# Patient Record
Sex: Female | Born: 1960
Health system: Southern US, Community
[De-identification: ages and names within clinical notes are randomized; demographics above are authoritative.]

## PROBLEM LIST (undated history)

## (undated) DIAGNOSIS — M5030 Other cervical disc degeneration, unspecified cervical region: Secondary | ICD-10-CM

## (undated) DIAGNOSIS — E063 Autoimmune thyroiditis: Secondary | ICD-10-CM

## (undated) DIAGNOSIS — R079 Chest pain, unspecified: Secondary | ICD-10-CM

## (undated) DIAGNOSIS — F419 Anxiety disorder, unspecified: Secondary | ICD-10-CM

## (undated) DIAGNOSIS — N289 Disorder of kidney and ureter, unspecified: Secondary | ICD-10-CM

## (undated) DIAGNOSIS — R0602 Shortness of breath: Secondary | ICD-10-CM

## (undated) DIAGNOSIS — Z87820 Personal history of traumatic brain injury: Secondary | ICD-10-CM

## (undated) DIAGNOSIS — R5382 Chronic fatigue, unspecified: Secondary | ICD-10-CM

## (undated) DIAGNOSIS — Z87898 Personal history of other specified conditions: Secondary | ICD-10-CM

## (undated) DIAGNOSIS — Z8719 Personal history of other diseases of the digestive system: Secondary | ICD-10-CM

## (undated) DIAGNOSIS — G8929 Other chronic pain: Secondary | ICD-10-CM

## (undated) DIAGNOSIS — B259 Cytomegaloviral disease, unspecified: Secondary | ICD-10-CM

## (undated) DIAGNOSIS — Z8619 Personal history of other infectious and parasitic diseases: Secondary | ICD-10-CM

## (undated) DIAGNOSIS — M797 Fibromyalgia: Secondary | ICD-10-CM

## (undated) DIAGNOSIS — R233 Spontaneous ecchymoses: Secondary | ICD-10-CM

## (undated) DIAGNOSIS — Z8711 Personal history of peptic ulcer disease: Secondary | ICD-10-CM

## (undated) DIAGNOSIS — F32A Depression, unspecified: Secondary | ICD-10-CM

## (undated) DIAGNOSIS — M4800 Spinal stenosis, site unspecified: Secondary | ICD-10-CM

## (undated) DIAGNOSIS — Z9889 Other specified postprocedural states: Secondary | ICD-10-CM

## (undated) DIAGNOSIS — N6019 Diffuse cystic mastopathy of unspecified breast: Secondary | ICD-10-CM

## (undated) DIAGNOSIS — D649 Anemia, unspecified: Secondary | ICD-10-CM

## (undated) DIAGNOSIS — M1990 Unspecified osteoarthritis, unspecified site: Secondary | ICD-10-CM

## (undated) DIAGNOSIS — F329 Major depressive disorder, single episode, unspecified: Secondary | ICD-10-CM

## (undated) DIAGNOSIS — E538 Deficiency of other specified B group vitamins: Secondary | ICD-10-CM

## (undated) DIAGNOSIS — D219 Benign neoplasm of connective and other soft tissue, unspecified: Secondary | ICD-10-CM

## (undated) DIAGNOSIS — Z973 Presence of spectacles and contact lenses: Secondary | ICD-10-CM

## (undated) DIAGNOSIS — E039 Hypothyroidism, unspecified: Secondary | ICD-10-CM

## (undated) DIAGNOSIS — R009 Unspecified abnormalities of heart beat: Secondary | ICD-10-CM

## (undated) DIAGNOSIS — R238 Other skin changes: Secondary | ICD-10-CM

## (undated) DIAGNOSIS — K5900 Constipation, unspecified: Secondary | ICD-10-CM

## (undated) DIAGNOSIS — R0789 Other chest pain: Secondary | ICD-10-CM

## (undated) DIAGNOSIS — Z8669 Personal history of other diseases of the nervous system and sense organs: Secondary | ICD-10-CM

## (undated) DIAGNOSIS — G9332 Myalgic encephalomyelitis/chronic fatigue syndrome: Secondary | ICD-10-CM

## (undated) DIAGNOSIS — K219 Gastro-esophageal reflux disease without esophagitis: Secondary | ICD-10-CM

## (undated) DIAGNOSIS — M549 Dorsalgia, unspecified: Secondary | ICD-10-CM

## (undated) DIAGNOSIS — A281 Cat-scratch disease: Secondary | ICD-10-CM

## (undated) DIAGNOSIS — R1310 Dysphagia, unspecified: Secondary | ICD-10-CM

## (undated) DIAGNOSIS — N939 Abnormal uterine and vaginal bleeding, unspecified: Secondary | ICD-10-CM

## (undated) DIAGNOSIS — E162 Hypoglycemia, unspecified: Secondary | ICD-10-CM

## (undated) DIAGNOSIS — Z86018 Personal history of other benign neoplasm: Secondary | ICD-10-CM

## (undated) DIAGNOSIS — A6920 Lyme disease, unspecified: Secondary | ICD-10-CM

## (undated) DIAGNOSIS — Z8639 Personal history of other endocrine, nutritional and metabolic disease: Secondary | ICD-10-CM

## (undated) DIAGNOSIS — R002 Palpitations: Secondary | ICD-10-CM

## (undated) DIAGNOSIS — M2550 Pain in unspecified joint: Secondary | ICD-10-CM

## (undated) HISTORY — DX: Benign neoplasm of connective and other soft tissue, unspecified: D21.9

## (undated) HISTORY — DX: Other cervical disc degeneration, unspecified cervical region: M50.30

## (undated) HISTORY — DX: Unspecified osteoarthritis, unspecified site: M19.90

## (undated) HISTORY — DX: Dysphagia, unspecified: R13.10

## (undated) HISTORY — DX: Personal history of other infectious and parasitic diseases: Z86.19

## (undated) HISTORY — DX: Shortness of breath: R06.02

## (undated) HISTORY — DX: Pain in unspecified joint: M25.50

## (undated) HISTORY — DX: Personal history of peptic ulcer disease: Z87.11

## (undated) HISTORY — DX: Diffuse cystic mastopathy of unspecified breast: N60.19

## (undated) HISTORY — DX: Myalgic encephalomyelitis/chronic fatigue syndrome: G93.32

## (undated) HISTORY — DX: Spinal stenosis, site unspecified: M48.00

## (undated) HISTORY — DX: Disorder of kidney and ureter, unspecified: N28.9

## (undated) HISTORY — DX: Deficiency of other specified B group vitamins: E53.8

## (undated) HISTORY — DX: Chronic fatigue, unspecified: R53.82

## (undated) HISTORY — DX: Chest pain, unspecified: R07.9

## (undated) HISTORY — DX: Depression, unspecified: F32.A

## (undated) HISTORY — DX: Lyme disease, unspecified: A69.20

## (undated) HISTORY — DX: Autoimmune thyroiditis: E06.3

## (undated) HISTORY — DX: Other specified postprocedural states: Z98.890

## (undated) HISTORY — DX: Palpitations: R00.2

## (undated) HISTORY — DX: Cytomegaloviral disease, unspecified: B25.9

## (undated) HISTORY — DX: Major depressive disorder, single episode, unspecified: F32.9

## (undated) HISTORY — DX: Hypothyroidism, unspecified: E03.9

## (undated) HISTORY — DX: Unspecified abnormalities of heart beat: R00.9

## (undated) HISTORY — PX: TILT TABLE STUDY: SHX6124

## (undated) HISTORY — DX: Fibromyalgia: M79.7

## (undated) HISTORY — PX: OTHER SURGICAL HISTORY: SHX169

## (undated) HISTORY — DX: Anemia, unspecified: D64.9

## (undated) HISTORY — PX: TONSILLECTOMY: SUR1361

## (undated) HISTORY — DX: Constipation, unspecified: K59.00

## (undated) HISTORY — DX: Cat-scratch disease: A28.1

---

## 2004-02-26 HISTORY — PX: OTHER SURGICAL HISTORY: SHX169

## 2005-02-25 HISTORY — PX: GANGLION CYST EXCISION: SHX1691

## 2005-04-23 ENCOUNTER — Other Ambulatory Visit: Admission: RE | Admit: 2005-04-23 | Discharge: 2005-04-23 | Payer: Self-pay | Admitting: Obstetrics and Gynecology

## 2005-08-15 ENCOUNTER — Encounter: Admission: RE | Admit: 2005-08-15 | Discharge: 2005-08-15 | Payer: Self-pay | Admitting: Obstetrics and Gynecology

## 2005-09-03 ENCOUNTER — Encounter: Admission: RE | Admit: 2005-09-03 | Discharge: 2005-09-03 | Payer: Self-pay | Admitting: Obstetrics and Gynecology

## 2005-09-11 ENCOUNTER — Encounter: Admission: RE | Admit: 2005-09-11 | Discharge: 2005-09-11 | Payer: Self-pay | Admitting: Obstetrics and Gynecology

## 2006-06-16 ENCOUNTER — Encounter: Admission: RE | Admit: 2006-06-16 | Discharge: 2006-06-16 | Payer: Self-pay | Admitting: Orthopedic Surgery

## 2006-06-18 ENCOUNTER — Ambulatory Visit: Payer: Self-pay | Admitting: Gastroenterology

## 2006-06-24 ENCOUNTER — Encounter (INDEPENDENT_AMBULATORY_CARE_PROVIDER_SITE_OTHER): Payer: Self-pay | Admitting: Gastroenterology

## 2006-06-24 ENCOUNTER — Ambulatory Visit: Payer: Self-pay | Admitting: Gastroenterology

## 2006-06-30 ENCOUNTER — Ambulatory Visit: Payer: Self-pay | Admitting: Gastroenterology

## 2006-07-25 ENCOUNTER — Ambulatory Visit: Payer: Self-pay | Admitting: Gastroenterology

## 2006-08-23 ENCOUNTER — Encounter: Admission: RE | Admit: 2006-08-23 | Discharge: 2006-08-23 | Payer: Self-pay | Admitting: Gastroenterology

## 2006-09-10 ENCOUNTER — Encounter: Admission: RE | Admit: 2006-09-10 | Discharge: 2006-09-10 | Payer: Self-pay | Admitting: Obstetrics and Gynecology

## 2006-09-22 ENCOUNTER — Encounter: Admission: RE | Admit: 2006-09-22 | Discharge: 2006-09-22 | Payer: Self-pay | Admitting: Gastroenterology

## 2007-09-11 ENCOUNTER — Encounter: Admission: RE | Admit: 2007-09-11 | Discharge: 2007-09-11 | Payer: Self-pay | Admitting: Obstetrics and Gynecology

## 2008-02-26 HISTORY — PX: HERNIA REPAIR: SHX51

## 2008-12-13 ENCOUNTER — Encounter: Admission: RE | Admit: 2008-12-13 | Discharge: 2008-12-13 | Payer: Self-pay | Admitting: Obstetrics and Gynecology

## 2009-12-15 ENCOUNTER — Ambulatory Visit (HOSPITAL_COMMUNITY): Admission: RE | Admit: 2009-12-15 | Discharge: 2009-12-15 | Payer: Self-pay | Admitting: Obstetrics and Gynecology

## 2010-03-18 ENCOUNTER — Encounter: Payer: Self-pay | Admitting: Obstetrics and Gynecology

## 2010-07-10 NOTE — Assessment & Plan Note (Signed)
[facility]                         GASTROENTEROLOGY OFFICE NOTE   NAME:[name], [name]                   MRN:          [id number]  DATE:07/25/2006                            DOB:          [birth year]    [name] comes in on May 30.  Says that she is still having some left lower  quadrant pain.  She is still having some left lower quadrant pain and is  having an ultrasound with her OB/GYN, Dr. Darcus Austin because of this.  Her  endoscopy revealed a small hiatal hernia with some minimal gastritis.  A  small bowel biopsy was normal.  Ultrasound was totally normal.  I read  all of this to her.   PHYSICAL EXAMINATION:  VITAL SIGNS:  On her examination, she weighed  107.  Blood pressure 128/66, pulse 72 and regular.  NECK:  Unremarkable.  HEART:  Unremarkable.  EXTREMITIES:  Unremarkable.   IMPRESSION:  1. Most likely irritable bowel syndrome.  2. Gastroesophageal reflux disease.  3. Anxiety/depression.  4. Chronic fatigue syndrome.  5. Fibromyalgia.  6. Status post Lyme's disease.  7. History of babesiosis.   Her medications include Mepron, sulfamethoxazole, Armour Thyroid,  Sertraline 100 mg daily, Flexeril, Vicodin, probiotics, Cortef, and  Diflucan.   My recommendation is that she try Zegerid, which she might not do  because she does not like to take medicines, very anxious about this.  I  told her she probably has a semblance of irritable bowel syndrome.  We  reviewed all of her other studies, and they were negative.  I told her  that she should follow up with one of my associates in the future.     [name], MD  Electronically Signed    SML/MedQ  DD: 07/25/2006  DT: 07/25/2006  Job #: [id number]

## 2010-07-13 NOTE — Assessment & Plan Note (Signed)
[facility]                         GASTROENTEROLOGY OFFICE NOTE   NAME:Gonzalez, Mask                   MRN:          [id number]  DATE:06/18/2006                            DOB:          11/02/1960    PROBLEM:  Abdominal pain, bloating.   HISTORY:  [name] is a 50 year old white female with multiple medical  problems.  She says that she carries a diagnosis of chronic fatigue  syndrome, fibromyalgia, Lyme disease.  She has history of anxiety and  depression, and has a peripheral neuropathy.  She is currently followed  by a Dr. Prince Rome.   The patient has been on multiple different medication regimens and  antibiotics for apparently Lyme disease and babesiosis.  Is currently  taking a course of Diflucan for a systemic candidiasis as well.  She  relates that she has been having epigastric pain radiating into her back  associated with abdominal bloating over the past year.  This has  progressed somewhat to the point where she feels bloated all of the  time.  She has also gained some weight over the past few years.  She had  been on a long course of Cortef, which she is still taking.  She has  active symptoms with her fibromyalgia, and says that she has pain in all  of her muscles most of the time, and feels that her abdominal discomfort  is worse with any empty stomach, seems to be better after p.o. intake,  or post taking her antibiotics.  She denies any difficulty with  heartburn or indigestion.  She is not currently having any difficulty  with dysphagia or odynophagia.  She has no nausea or vomiting.  Her  bowel habits seem to alternate, and apparently are not consistent,  though she is not currently having any significant difficulty with  either diarrhea or constipation.  She says she has been on alternating  courses of antibiotics for the past several years due to her diagnoses  with the Lyme disease, babesiosis, etc.  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   The patient had previous colonoscopy done in 2001 in [place], which was  apparently a negative exam.  We do not have a copy of that report.   CURRENT MEDICATIONS:  1. Thyroid supplement.  2. Flexeril p.r.n.  3. Vicodin 7.5/750 p.r.n.  4. She is on a probiotic, unspecified.  5. Cortef.  6. Diflucan.  7. Sulfamethoxazole.  8. Mepron.   She did not bring these medicines with her today, and does not know the  dosages currently.   CURRENT ALLERGIES:  1. IODINE.  2. CONTRAST AND IV DYE.   PAST MEDICAL HISTORY:  1. Hypothyroid.  2. Anxiety.  3. Depression.  4. History of uterine fibroids.  5. Chronic fatigue syndrome.  6. Fibromyalgia.  7. Lyme disease.   FAMILY HISTORY:  Negative for colon cancer and polyps.  Positive for  heart disease in her father.   SOCIAL HISTORY:  Patient is married.  She had previously been employed  as  a Publishing copy.  She does have 2 masters degrees.  She is a  nonsmoker.  Nondrinker.   REVIEW OF SYSTEMS:  Positive for ongoing problems with myalgias and  fatigue, insomnia, back pain, dysmenorrhea, intermittent fevers,  arthralgias, periodic difficulty with psoriasis, allergy sinus symptoms.  Otherwise, as outlined in HPI.  Remainder of review of systems is  negative.   PHYSICAL EXAMINATION:  Well-developed white female, in no acute  distress.  Height is 5 feet 4.  Weight is 153.  Blood pressure 122/84.  Pulse is  88.  HEENT:  Atraumatic and normocephalic.  EOMI.  PERRLA.  Sclerae  anicteric.  NECK:  Supple without nodes.  CARDIOVASCULAR:  Regular rate and rhythm with S1 and S2.  No murmur, rub  or gallop.  PULMONARY:  Clear to A&P.  ABDOMEN:  Soft.  Bowel sounds are active.  She is mildly tender in the  epigastrium and left upper quadrant.  There is no palpable mass or  hepatosplenomegaly.  No guarding or rebound.  RECTAL EXAM:  Is  Hemoccult negative.  She does have a small external  hemorrhoidal tag.   IMPRESSION:  1. A 50 year old white female with chronic epigastric pain and      bloating.  Rule out irritable bowel syndrome, bacterial overgrowth,      gallbladder disease, possible peptic ulcer disease.  2. Fibromyalgia.  Chronic fatigue syndrome.  3. Reported history of Lyme disease, systemic candidiasis, and      babesiosis for which she has been on a variety of antibiotics.  4. Anxiety and depression.   PLAN:  1. Scheduled upper endoscopy.  2. Schedule upper abdominal ultrasound.  3. Check CBC with differential and lipase.  4. Trial of Align 1 p.o. daily.  5. Trial of Protonix 40 mg p.o. daily.  6. Further workup pending findings on EGD and ultrasound.      Mike Gip, PA-C  Electronically Signed      [name], MD  Electronically Signed   AE/MedQ  DD: 06/24/2006  DT: 06/24/2006  Job #: [id number]

## 2010-07-25 ENCOUNTER — Other Ambulatory Visit: Payer: Self-pay | Admitting: *Deleted

## 2010-07-25 DIAGNOSIS — M549 Dorsalgia, unspecified: Secondary | ICD-10-CM

## 2010-08-06 ENCOUNTER — Ambulatory Visit
Admission: RE | Admit: 2010-08-06 | Discharge: 2010-08-06 | Disposition: A | Discharge status: Home / Self Care | Payer: Medicare Other | Source: Ambulatory Visit | Attending: *Deleted | Admitting: *Deleted

## 2010-08-06 ENCOUNTER — Ambulatory Visit
Admission: RE | Admit: 2010-08-06 | Discharge: 2010-08-06 | Disposition: A | Discharge status: Home / Self Care | Payer: 59 | Source: Ambulatory Visit | Attending: *Deleted | Admitting: *Deleted

## 2010-08-06 DIAGNOSIS — M549 Dorsalgia, unspecified: Secondary | ICD-10-CM

## 2010-08-14 ENCOUNTER — Other Ambulatory Visit: Payer: Self-pay | Admitting: *Deleted

## 2010-08-14 DIAGNOSIS — M545 Low back pain, unspecified: Secondary | ICD-10-CM

## 2010-08-14 DIAGNOSIS — M542 Cervicalgia: Secondary | ICD-10-CM

## 2010-08-15 ENCOUNTER — Other Ambulatory Visit: Payer: Medicare Other

## 2010-08-15 ENCOUNTER — Other Ambulatory Visit: Payer: 59

## 2010-08-15 ENCOUNTER — Ambulatory Visit
Admission: RE | Admit: 2010-08-15 | Discharge: 2010-08-15 | Disposition: A | Discharge status: Home / Self Care | Payer: Medicare Other | Source: Ambulatory Visit | Attending: *Deleted | Admitting: *Deleted

## 2010-08-15 DIAGNOSIS — M545 Low back pain, unspecified: Secondary | ICD-10-CM

## 2010-08-15 DIAGNOSIS — M542 Cervicalgia: Secondary | ICD-10-CM

## 2010-10-30 ENCOUNTER — Other Ambulatory Visit (HOSPITAL_COMMUNITY): Payer: Self-pay | Admitting: Internal Medicine

## 2010-10-30 DIAGNOSIS — E210 Primary hyperparathyroidism: Secondary | ICD-10-CM

## 2010-11-13 ENCOUNTER — Encounter (HOSPITAL_COMMUNITY)
Admission: RE | Admit: 2010-11-13 | Discharge: 2010-11-13 | Disposition: A | Discharge status: Home / Self Care | Payer: 59 | Source: Ambulatory Visit | Attending: Internal Medicine | Admitting: Internal Medicine

## 2010-11-13 ENCOUNTER — Ambulatory Visit (HOSPITAL_COMMUNITY): Payer: 59

## 2010-11-13 DIAGNOSIS — E210 Primary hyperparathyroidism: Secondary | ICD-10-CM

## 2010-11-13 MED ORDER — TECHNETIUM TC 99M SESTAMIBI - CARDIOLITE
25.0000 | Freq: Once | INTRAVENOUS | Status: AC | PRN
  Administered 2010-11-13: 25 via INTRAVENOUS

## 2010-12-24 ENCOUNTER — Other Ambulatory Visit: Payer: Self-pay | Admitting: Obstetrics and Gynecology

## 2011-01-07 ENCOUNTER — Encounter (INDEPENDENT_AMBULATORY_CARE_PROVIDER_SITE_OTHER): Payer: Self-pay | Admitting: Surgery

## 2011-01-08 ENCOUNTER — Encounter (INDEPENDENT_AMBULATORY_CARE_PROVIDER_SITE_OTHER): Payer: Self-pay | Admitting: Surgery

## 2011-01-08 ENCOUNTER — Ambulatory Visit (INDEPENDENT_AMBULATORY_CARE_PROVIDER_SITE_OTHER): Payer: 59 | Admitting: Surgery

## 2011-01-08 DIAGNOSIS — E210 Primary hyperparathyroidism: Secondary | ICD-10-CM

## 2011-01-08 NOTE — Progress Notes (Signed)
Chief Complaint  Patient presents with  . Parathyroid Adenoma    referral from Dr. Talmage Coin    HISTORY: Patient is a 50 year old white female referred by her endocrinologist for consideration for minimally invasive parathyroidectomy. Patient has been followed closely for multiple medical concerns. Laboratory studies over the past one to 2 years have shown a persistently elevated serum calcium level. Recent calcium levels were measured at 10.6 with an ionized calcium of 5.8. Intact PTH level was checked and was in the mid normal range at 34. However given the serum calcium level this did not appear to be normally suppressed. Therefore a 24-hour urine collection for calcium was obtained and was elevated at 268. A nuclear medicine parathyroid scan was performed which localized abnormal uptake in the left inferior position. Patient's vitamin D level was normal at 42. Patient is now referred for consideration for minimally invasive left inferior parathyroidectomy.  Patient does have a family history of hypothyroidism in herself, her sisters, and her mother. There is no family history of other endocrinopathy. Patient has had a tonsillectomy but no other head or neck surgery has been performed.   Past Medical History  Diagnosis Date  . Hyperparathyroidism   . Hashimoto thyroiditis   . Bell's palsy   . Lyme disease   . Depression   . Asthma   . Migraines   . Hiatal hernia   . Fibroids   . Neuropathy     hands and feet  . HSV-2 (herpes simplex virus 2) infection   . Esophageal reflux   . Scoliosis   . Osteoarthritis   . Vitamin D deficiency   . Fibromyalgia   . Fibrocystic breast disease   . Internal hemorrhoids   . Allergic rhinitis   . Cat-scratch disease   . History of babesiosis   . Arrhythmia      Current Outpatient Prescriptions  Medication Sig Dispense Refill  . acyclovir (ZOVIRAX) 200 MG capsule Take by mouth every 4 (four) hours while awake.        . beclomethasone  (QVAR) 80 MCG/ACT inhaler Inhale 1 puff into the lungs as needed.        . cholecalciferol (VITAMIN D) 400 UNITS TABS Take by mouth.        . clarithromycin (BIAXIN) 500 MG tablet BID times 48H.      . clindamycin (CLEOCIN) 300 MG capsule BID times 48H.      . clonazePAM (KLONOPIN) 0.5 MG tablet Take 0.5 mg by mouth 2 (two) times daily as needed.        . cyclobenzaprine (FLEXERIL) 10 MG tablet Take 10 mg by mouth 3 (three) times daily as needed.        . DULoxetine (CYMBALTA) 60 MG capsule Take 60 mg by mouth daily.        . fluconazole (DIFLUCAN) 150 MG tablet Take 150 mg by mouth once. Please check dose       . gabapentin (NEURONTIN) 300 MG capsule Take 300 mg by mouth as needed.       Marland Kitchen HYDROcodone-acetaminophen (VICODIN ES) 7.5-750 MG per tablet Take 1 tablet by mouth every 6 (six) hours as needed.        . hydrocortisone (CORTEF) 5 MG tablet daily.      . hydroxychloroquine (PLAQUENIL) 200 MG tablet Take by mouth daily.        Marland Kitchen levalbuterol (XOPENEX) 0.63 MG/3ML nebulizer solution Take 1 ampule by nebulization every 4 (four) hours as needed.        Marland Kitchen  levocetirizine (XYZAL) 5 MG tablet       . montelukast (SINGULAIR) 10 MG tablet       . Naltrexone HCl POWD by Does not apply route.        . nystatin (MYCOSTATIN) 500000 UNITS TABS Take 1 tablet by mouth every 8 (eight) hours.        . Probiotic Product (PRO-BIOTIC BLEND PO) Take by mouth.        . progesterone (PROMETRIUM) 100 MG capsule Take 100 mg by mouth daily. Please check dose      . thyroid (ARMOUR) 120 MG tablet Take 45 mg by mouth daily.        . traMADol (ULTRAM-ER) 200 MG 24 hr tablet       . traZODone (DESYREL) 50 MG tablet Take 50 mg by mouth at bedtime.        . vitamin B-12 (CYANOCOBALAMIN) 1000 MCG tablet 1,000 mcg by Other route daily.        Marland Kitchen VIVELLE-DOT 0.075 MG/24HR          Allergies  Allergen Reactions  . Flagyl (Metronidazole Hcl)   . Iodine   . Iohexol      Code: HIVES, Desc: pt. states had hives post  contrast injection for c.t. scan.   . Synthroid   . Tindamax      Family History  Problem Relation Age of Onset  . Mitral valve prolapse Father   . Hypertension Father   . Dementia Father   . Hypothyroidism Mother   . Colon polyps Brother   . Aneurysm Brother   . Arthritis Brother   . Lupus Sister   . Rheum arthritis Sister   . Osteoarthritis Sister   . Cancer Maternal Aunt     breast     History   Social History  . Marital Status: Married    Spouse Name: N/A    Number of Children: N/A  . Years of Education: N/A   Social History Main Topics  . Smoking status: Never Smoker   . Smokeless tobacco: None  . Alcohol Use: No  . Drug Use: No  . Sexually Active:    Other Topics Concern  . None   Social History Narrative  . None     REVIEW OF SYSTEMS - PERTINENT POSITIVES ONLY: No history of nephrolithiasis. No history of osteoporosis. Moderate fatigue.   EXAM: Filed Vitals:   01/08/11 1358  BP: 122/88  Pulse: 80  Temp: 97 F (36.1 C)  Resp: 12    HEENT: normocephalic; pupils equal and reactive; sclerae clear; dentition good; mucous membranes moist NECK:  No palpable masses or nodules; symmetric on extension; no palpable anterior or posterior cervical lymphadenopathy; no supraclavicular masses; no tenderness CHEST: clear to auscultation bilaterally without rales, rhonchi, or wheezes CARDIAC: regular rate and rhythm without significant murmur; peripheral pulses are full EXT:  non-tender without edema; no deformity NEURO: no gross focal deficits; no sign of tremor   LABORATORY RESULTS: See E-Chart for most recent results   RADIOLOGY RESULTS: See E-Chart or I-Site for most recent results   IMPRESSION: Hypercalcemia, likely left inferior parathyroid adenoma   PLAN: I had a lengthy discussion with the patient and her husband. They recorded our conversation in its entirety. I discussed primary hyperparathyroidism. We reviewed all of her laboratory  results and diagnostic studies. I agree with the recommendation of her gynecologist that she undergo a minimally invasive parathyroidectomy. We discussed the possibility of success. We discussed the possibility of  a second gland adenoma. We discussed the possibility of repeat surgery or her performing a more extensive procedure at the time of exploration. We discussed potential complications including injury to recurrent laryngeal nerves or other structures in the neck. We discussed the surgical wound to be anticipated. We discussed the hospital stay and her postoperative recovery. They understand and wish to proceed.  The risks and benefits of the procedure have been discussed at length with the patient.  The patient understands the proposed procedure, potential alternative treatments, and the course of recovery to be expected.  All of the patient's questions have been answered at this time.  The patient wishes to proceed with surgery and will schedule a date for their procedure through our office staff.   Jordan Heckler, MD, FACS General & Endocrine Surgery [facility]    Visit Diagnoses: 1. Hyperparathyroidism, primary     Primary Care Physician: Lolita Patella, MD  Endo:  Dr. Talmage Coin

## 2011-01-22 ENCOUNTER — Encounter (HOSPITAL_COMMUNITY): Payer: Self-pay | Admitting: Pharmacy Technician

## 2011-01-24 ENCOUNTER — Ambulatory Visit (HOSPITAL_COMMUNITY)
Admission: RE | Admit: 2011-01-24 | Discharge: 2011-01-24 | Disposition: A | Discharge status: Home / Self Care | Payer: 59 | Source: Ambulatory Visit | Attending: Surgery | Admitting: Surgery

## 2011-01-24 ENCOUNTER — Encounter (HOSPITAL_COMMUNITY): Payer: Self-pay

## 2011-01-24 ENCOUNTER — Other Ambulatory Visit: Payer: Self-pay

## 2011-01-24 ENCOUNTER — Encounter (HOSPITAL_COMMUNITY)
Admission: RE | Admit: 2011-01-24 | Discharge: 2011-01-24 | Disposition: A | Discharge status: Home / Self Care | Payer: 59 | Source: Ambulatory Visit | Attending: Surgery | Admitting: Surgery

## 2011-01-24 DIAGNOSIS — E063 Autoimmune thyroiditis: Secondary | ICD-10-CM | POA: Insufficient documentation

## 2011-01-24 DIAGNOSIS — Z01810 Encounter for preprocedural cardiovascular examination: Secondary | ICD-10-CM | POA: Insufficient documentation

## 2011-01-24 DIAGNOSIS — IMO0001 Reserved for inherently not codable concepts without codable children: Secondary | ICD-10-CM | POA: Insufficient documentation

## 2011-01-24 DIAGNOSIS — E213 Hyperparathyroidism, unspecified: Secondary | ICD-10-CM | POA: Insufficient documentation

## 2011-01-24 DIAGNOSIS — Z01812 Encounter for preprocedural laboratory examination: Secondary | ICD-10-CM | POA: Insufficient documentation

## 2011-01-24 DIAGNOSIS — K219 Gastro-esophageal reflux disease without esophagitis: Secondary | ICD-10-CM | POA: Insufficient documentation

## 2011-01-24 DIAGNOSIS — Z79899 Other long term (current) drug therapy: Secondary | ICD-10-CM | POA: Insufficient documentation

## 2011-01-24 HISTORY — DX: Hypoglycemia, unspecified: E16.2

## 2011-01-24 LAB — CBC
HCT: 38.4 % (ref 36.0–46.0)
Hemoglobin: 12.8 g/dL (ref 12.0–15.0)
MCH: 30.7 pg (ref 26.0–34.0)
MCHC: 33.3 g/dL (ref 30.0–36.0)
MCV: 92.1 fL (ref 78.0–100.0)
RBC: 4.17 MIL/uL (ref 3.87–5.11)

## 2011-01-24 LAB — SURGICAL PCR SCREEN
MRSA, PCR: NEGATIVE
Staphylococcus aureus: NEGATIVE

## 2011-01-24 LAB — URINALYSIS, ROUTINE W REFLEX MICROSCOPIC
Glucose, UA: NEGATIVE mg/dL
Specific Gravity, Urine: 1.019 (ref 1.005–1.030)
Urobilinogen, UA: 0.2 mg/dL (ref 0.0–1.0)
pH: 5.5 (ref 5.0–8.0)

## 2011-01-24 LAB — BASIC METABOLIC PANEL
BUN: 16 mg/dL (ref 6–23)
CO2: 29 mEq/L (ref 19–32)
Glucose, Bld: 69 mg/dL — ABNORMAL LOW (ref 70–99)
Potassium: 4 mEq/L (ref 3.5–5.1)
Sodium: 138 mEq/L (ref 135–145)

## 2011-01-24 LAB — URINE MICROSCOPIC-ADD ON

## 2011-01-24 LAB — HCG, SERUM, QUALITATIVE: Preg, Serum: NEGATIVE

## 2011-01-24 NOTE — Progress Notes (Signed)
Quick Note:  These results are acceptable for scheduled surgery. TMG ______ 

## 2011-01-24 NOTE — Patient Instructions (Signed)
20 [name]  01/24/2011   Your procedure is scheduled on:01/28/2011 0730 am-0900 am   Report to [facility] at 0530 AM.  Call this number if you have problems the morning of surgery: [phone]   Remember:   Do not eat food:After Midnight.  May have clear liquids:until Midnight .  Clear liquids include soda, tea, black coffee, apple or grape juice, broth.  Take these medicines the morning of surgery with A SIP OF WATER:    Do not wear jewelry, make-up or nail polish.  Do not wear lotions, powders, or perfumes.    Do not shave 48 hours prior to surgery.  Do not bring valuables to the hospital.  Contacts, dentures or bridgework may not be worn into surgery.       Patients discharged the day of surgery will not be allowed to drive home.  Name and phone number of your driver:   Special Instructions: CHG Shower Use Special Wash: 1/2 bottle night before surgery and 1/2 bottle morning of surgery. Shower chin to toes with CHG.  Wash face and private parts with regular soap.    Please read over the following fact sheets that you were given: MRSA Information, coughing and deep breathing exercises, leg exercises

## 2011-01-24 NOTE — Anesthesia Preprocedure Evaluation (Addendum)
Anesthesia Evaluation  Patient identified by MRN, date of birth, ID band Patient awake    Reviewed: Allergy & Precautions, H&P , NPO status , Patient's Chart, lab work & pertinent test results  Airway Mallampati: II TM Distance: >3 FB Neck ROM: Full    Dental No notable dental hx.    Pulmonary neg pulmonary ROS, asthma ,  clear to auscultation  Pulmonary exam normal       Cardiovascular hypertension, neg cardio ROS Regular Normal    Neuro/Psych  Headaches, PSYCHIATRIC DISORDERS  Neuromuscular disease Negative Neurological ROS  Negative Psych ROS   GI/Hepatic negative GI ROS, Neg liver ROS, hiatal hernia, GERD-  ,  Endo/Other  Negative Endocrine ROS  Renal/GU negative Renal ROS  Genitourinary negative   Musculoskeletal negative musculoskeletal ROS (+) Fibromyalgia -  Abdominal   Peds negative pediatric ROS (+)  Hematology negative hematology ROS (+)   Anesthesia Other Findings   Reproductive/Obstetrics negative OB ROS                         Anesthesia Physical Anesthesia Plan  ASA: III  Anesthesia Plan: General   Post-op Pain Management:    Induction: Intravenous  Airway Management Planned: Oral ETT  Additional Equipment:   Intra-op Plan:   Post-operative Plan: Extubation in OR  Informed Consent: I have reviewed the patients History and Physical, chart, labs and discussed the procedure including the risks, benefits and alternatives for the proposed anesthesia with the patient or authorized representative who has indicated his/her understanding and acceptance.   Dental advisory given  Plan Discussed with: CRNA  Anesthesia Plan Comments: (Discussed with Dr. Acey Lav)        Anesthesia Quick Evaluation

## 2011-01-25 NOTE — Progress Notes (Signed)
Faxed to [place] 

## 2011-01-28 ENCOUNTER — Encounter (HOSPITAL_COMMUNITY): Payer: Self-pay | Admitting: Surgery

## 2011-01-28 ENCOUNTER — Encounter (HOSPITAL_COMMUNITY)
Admission: RE | Disposition: A | Discharge status: Home / Self Care | Payer: Self-pay | Source: Ambulatory Visit | Attending: Surgery

## 2011-01-28 ENCOUNTER — Ambulatory Visit (HOSPITAL_COMMUNITY)
Admission: RE | Admit: 2011-01-28 | Discharge: 2011-01-28 | Disposition: A | Discharge status: Home / Self Care | Payer: 59 | Source: Ambulatory Visit | Attending: Surgery | Admitting: Surgery

## 2011-01-28 ENCOUNTER — Ambulatory Visit (HOSPITAL_COMMUNITY): Payer: 59 | Admitting: Anesthesiology

## 2011-01-28 ENCOUNTER — Encounter (HOSPITAL_COMMUNITY): Payer: Self-pay | Admitting: Anesthesiology

## 2011-01-28 ENCOUNTER — Other Ambulatory Visit (INDEPENDENT_AMBULATORY_CARE_PROVIDER_SITE_OTHER): Payer: Self-pay | Admitting: Surgery

## 2011-01-28 ENCOUNTER — Encounter (HOSPITAL_COMMUNITY): Payer: Self-pay | Admitting: *Deleted

## 2011-01-28 DIAGNOSIS — E210 Primary hyperparathyroidism: Secondary | ICD-10-CM

## 2011-01-28 DIAGNOSIS — M1990 Unspecified osteoarthritis, unspecified site: Secondary | ICD-10-CM | POA: Insufficient documentation

## 2011-01-28 DIAGNOSIS — D351 Benign neoplasm of parathyroid gland: Secondary | ICD-10-CM

## 2011-01-28 DIAGNOSIS — J45909 Unspecified asthma, uncomplicated: Secondary | ICD-10-CM | POA: Insufficient documentation

## 2011-01-28 DIAGNOSIS — Z79899 Other long term (current) drug therapy: Secondary | ICD-10-CM | POA: Insufficient documentation

## 2011-01-28 DIAGNOSIS — K219 Gastro-esophageal reflux disease without esophagitis: Secondary | ICD-10-CM | POA: Insufficient documentation

## 2011-01-28 HISTORY — PX: PARATHYROIDECTOMY: SHX19

## 2011-01-28 SURGERY — PARATHYROIDECTOMY
Anesthesia: General | Site: Neck | Wound class: Clean

## 2011-01-28 MED ORDER — FENTANYL CITRATE 0.05 MG/ML IJ SOLN
25.0000 ug | INTRAMUSCULAR | Status: DC | PRN
  Administered 2011-01-28: 25 ug via INTRAVENOUS

## 2011-01-28 MED ORDER — BUPIVACAINE HCL 0.25 % IJ SOLN
INTRAMUSCULAR | Status: DC | PRN
  Administered 2011-01-28: 10 mL

## 2011-01-28 MED ORDER — FENTANYL CITRATE 0.05 MG/ML IJ SOLN
INTRAMUSCULAR | Status: DC | PRN
  Administered 2011-01-28 (×3): 50 ug via INTRAVENOUS

## 2011-01-28 MED ORDER — MIDAZOLAM HCL 5 MG/5ML IJ SOLN
INTRAMUSCULAR | Status: DC | PRN
  Administered 2011-01-28: 2 mg via INTRAVENOUS

## 2011-01-28 MED ORDER — ROCURONIUM BROMIDE 100 MG/10ML IV SOLN
INTRAVENOUS | Status: DC | PRN
  Administered 2011-01-28: 40 mg via INTRAVENOUS

## 2011-01-28 MED ORDER — LACTATED RINGERS IV SOLN: INTRAVENOUS | Status: DC

## 2011-01-28 MED ORDER — LACTATED RINGERS IV SOLN
INTRAVENOUS | Status: DC | PRN
  Administered 2011-01-28 (×2): via INTRAVENOUS

## 2011-01-28 MED ORDER — CEFAZOLIN SODIUM 1-5 GM-% IV SOLN
1.0000 g | INTRAVENOUS | Status: AC
  Administered 2011-01-28: 1 g via INTRAVENOUS

## 2011-01-28 MED ORDER — PROMETHAZINE HCL 25 MG/ML IJ SOLN: 6.2500 mg | INTRAMUSCULAR | Status: DC | PRN

## 2011-01-28 MED ORDER — BUPIVACAINE HCL (PF) 0.25 % IJ SOLN
INTRAMUSCULAR | Status: AC
  Filled 2011-01-28: qty 30

## 2011-01-28 MED ORDER — HYDROCODONE-ACETAMINOPHEN 5-325 MG PO TABS
ORAL_TABLET | ORAL | Status: AC
  Administered 2011-01-28: 2 via ORAL
  Filled 2011-01-28: qty 2

## 2011-01-28 MED ORDER — MEPERIDINE HCL 50 MG/ML IJ SOLN: 6.2500 mg | INTRAMUSCULAR | Status: DC | PRN

## 2011-01-28 MED ORDER — HYDROCODONE-ACETAMINOPHEN 5-325 MG PO TABS: 1.0000 | ORAL_TABLET | ORAL | Status: AC | PRN

## 2011-01-28 MED ORDER — PROPOFOL 10 MG/ML IV EMUL
INTRAVENOUS | Status: DC | PRN
  Administered 2011-01-28: 140 mg via INTRAVENOUS

## 2011-01-28 MED ORDER — CEFAZOLIN SODIUM 1-5 GM-% IV SOLN
INTRAVENOUS | Status: AC
  Filled 2011-01-28: qty 50

## 2011-01-28 MED ORDER — SODIUM CHLORIDE 0.9 % IR SOLN
Status: DC | PRN
  Administered 2011-01-28: 1000 mL

## 2011-01-28 MED ORDER — ONDANSETRON HCL 4 MG/2ML IJ SOLN
INTRAMUSCULAR | Status: DC | PRN
  Administered 2011-01-28: 4 mg via INTRAVENOUS

## 2011-01-28 MED ORDER — DEXAMETHASONE SODIUM PHOSPHATE 10 MG/ML IJ SOLN
INTRAMUSCULAR | Status: DC | PRN
  Administered 2011-01-28: 10 mg via INTRAVENOUS

## 2011-01-28 MED ORDER — GLYCOPYRROLATE 0.2 MG/ML IJ SOLN
INTRAMUSCULAR | Status: DC | PRN
  Administered 2011-01-28: .6 mg via INTRAVENOUS

## 2011-01-28 MED ORDER — HYDROCODONE-ACETAMINOPHEN 5-325 MG PO TABS
1.0000 | ORAL_TABLET | ORAL | Status: DC | PRN
  Administered 2011-01-28: 2 via ORAL

## 2011-01-28 MED ORDER — FENTANYL CITRATE 0.05 MG/ML IJ SOLN
INTRAMUSCULAR | Status: AC
  Filled 2011-01-28: qty 2

## 2011-01-28 MED ORDER — NEOSTIGMINE METHYLSULFATE 1 MG/ML IJ SOLN
INTRAMUSCULAR | Status: DC | PRN
  Administered 2011-01-28: 4 mg via INTRAVENOUS

## 2011-01-28 MED ORDER — LIDOCAINE HCL (CARDIAC) 20 MG/ML IV SOLN
INTRAVENOUS | Status: DC | PRN
  Administered 2011-01-28: 80 mg via INTRAVENOUS

## 2011-01-28 SURGICAL SUPPLY — 45 items
APL SKNCLS STERI-STRIP NONHPOA (GAUZE/BANDAGES/DRESSINGS) ×1
ATTRACTOMAT 16X20 MAGNETIC DRP (DRAPES) ×2 IMPLANT
BENZOIN TINCTURE PRP APPL 2/3 (GAUZE/BANDAGES/DRESSINGS) ×2 IMPLANT
BLADE HEX COATED 2.75 (ELECTRODE) ×2 IMPLANT
BLADE SURG 15 STRL LF DISP TIS (BLADE) ×1 IMPLANT
BLADE SURG 15 STRL SS (BLADE) ×2
CANISTER SUCTION 2500CC (MISCELLANEOUS) ×2 IMPLANT
CHLORAPREP W/TINT 10.5 ML (MISCELLANEOUS) ×2 IMPLANT
CLIP TI MEDIUM 6 (CLIP) ×4 IMPLANT
CLIP TI WIDE RED SMALL 6 (CLIP) ×4 IMPLANT
CLOSURE STERI STRIP 1/2 X4 (GAUZE/BANDAGES/DRESSINGS) ×1 IMPLANT
CLOTH BEACON ORANGE TIMEOUT ST (SAFETY) ×2 IMPLANT
DISSECTOR ROUND CHERRY 3/8 STR (MISCELLANEOUS) IMPLANT
DRAPE PED LAPAROTOMY (DRAPES) ×2 IMPLANT
DRESSING SURGICEL FIBRLLR 1X2 (HEMOSTASIS) ×1 IMPLANT
DRSG SURGICEL FIBRILLAR 1X2 (HEMOSTASIS) ×2
ELECT REM PT RETURN 9FT ADLT (ELECTROSURGICAL) ×2
ELECTRODE REM PT RTRN 9FT ADLT (ELECTROSURGICAL) ×1 IMPLANT
GAUZE SPONGE 4X4 16PLY XRAY LF (GAUZE/BANDAGES/DRESSINGS) ×2 IMPLANT
GLOVE BIOGEL PI IND STRL 6.5 (GLOVE) IMPLANT
GLOVE BIOGEL PI INDICATOR 6.5 (GLOVE) ×1
GLOVE INDICATOR 6.5 STRL GRN (GLOVE) ×1 IMPLANT
GLOVE SURG ORTHO 8.0 STRL STRW (GLOVE) ×2 IMPLANT
GOWN STRL NON-REIN LRG LVL3 (GOWN DISPOSABLE) ×2 IMPLANT
GOWN STRL REIN XL XLG (GOWN DISPOSABLE) ×4 IMPLANT
KIT BASIN OR (CUSTOM PROCEDURE TRAY) ×2 IMPLANT
NDL HYPO 25X1 1.5 SAFETY (NEEDLE) ×1 IMPLANT
NEEDLE HYPO 25X1 1.5 SAFETY (NEEDLE) ×2 IMPLANT
NS IRRIG 1000ML POUR BTL (IV SOLUTION) ×2 IMPLANT
PACK BASIC VI WITH GOWN DISP (CUSTOM PROCEDURE TRAY) ×2 IMPLANT
PENCIL BUTTON HOLSTER BLD 10FT (ELECTRODE) ×2 IMPLANT
SPONGE GAUZE 4X4 12PLY (GAUZE/BANDAGES/DRESSINGS) ×1 IMPLANT
STAPLER VISISTAT 35W (STAPLE) ×2 IMPLANT
STRIP CLOSURE SKIN 1/2X4 (GAUZE/BANDAGES/DRESSINGS) ×2 IMPLANT
SUT MNCRL AB 4-0 PS2 18 (SUTURE) ×2 IMPLANT
SUT SILK 2 0 (SUTURE) ×2
SUT SILK 2-0 18XBRD TIE 12 (SUTURE) ×1 IMPLANT
SUT SILK 3 0 (SUTURE)
SUT SILK 3-0 18XBRD TIE 12 (SUTURE) IMPLANT
SUT VIC AB 3-0 SH 18 (SUTURE) ×2 IMPLANT
SYR BULB IRRIGATION 50ML (SYRINGE) ×2 IMPLANT
SYR CONTROL 10ML LL (SYRINGE) ×2 IMPLANT
TAPE CLOTH SURG 4X10 WHT LF (GAUZE/BANDAGES/DRESSINGS) ×1 IMPLANT
TOWEL OR 17X26 10 PK STRL BLUE (TOWEL DISPOSABLE) ×2 IMPLANT
YANKAUER SUCT BULB TIP 10FT TU (MISCELLANEOUS) ×2 IMPLANT

## 2011-01-28 NOTE — Progress Notes (Signed)
PAS hose removed pt up to BR

## 2011-01-28 NOTE — Preoperative (Signed)
Beta Blockers   Reason not to administer Beta Blockers:Not Applicable 

## 2011-01-28 NOTE — Op Note (Signed)
OPERATIVE REPORT - PARATHYROIDECTOMY  Preoperative diagnosis: Primary hyperparathyroidism  Postop diagnosis: Same  Procedure: Left inferior minimally invasive parathyroidectomy  Surgeon:  Velora Heckler, MD, FACS  Anesthesia: Gen. endotracheal  Estimated blood loss: Minimal  Preparation: ChloraPrep  Indications: Patient is a 50 yo white female with elevated calcium of 10.6 and PTH level of 34.  24 hour urine elevated at 268.  Sestamibi scan localizes to left inferior position.  Procedure: Patient was prepared in the holding area. He was brought to operating room and placed in a supine position on the operating room table. Following administration of general anesthesia, the patient was positioned and then prepped and draped in the usual strict aseptic fashion. After ascertaining that an adequate level of anesthesia been achieved, a neck incision is made with a #15 blade. Dissection was carried through subcutaneous tissues and platysma. Hemostasis was obtained with the electrocautery. Skin flaps were developed circumferentially and a Weitlander retractor was placed for exposure.  Strap muscles were incised in the midline. Strap muscles were reflected exposing the thyroid lobe. With gentle blunt dissection the thyroid lobe was mobilized.  Dissection was carried through adipose tissue and an enlarged parathyroid gland is identified. It is gently mobilized. Vascular structures are divided between small and medium ligaclips. Care is taken to avoid the recurrent laryngeal nerve and the esophagus. Gland is completely excised. It is submitted to pathology. Frozen section confirms parathyroid hyperplasia consistent with adenoma.  Neck is irrigated with warm saline. Good hemostasis is noted. Surgicel is placed in the operative field. Strap muscles are reapproximated in the midline with interrupted 3-0 Vicryl sutures. Platysma was closed with interrupted 3-0 Vicryl sutures. Skin is closed with a running  4-0 Monocryl subcuticular suture. Local anesthetic is infiltrated circumferentially. Wound was washed and dried and benzoin and Steri-Strips are applied. Sterile gauze dressing is applied. Patient is awakened from anesthesia and brought to the recovery room. The patient tolerated the procedure well.   Velora Heckler, MD, FACS General & Endocrine Surgery [facility]

## 2011-01-28 NOTE — Transfer of Care (Signed)
Immediate Anesthesia Transfer of Care Note  Patient: [name]  Procedure(s) Performed:  PARATHYROIDECTOMY  Patient Location: PACU  Anesthesia Type: General  Level of Consciousness: awake, alert , oriented and patient cooperative  Airway & Oxygen Therapy: Patient Spontanous Breathing and Patient connected to face mask oxygen  Post-op Assessment: Report given to PACU RN and Post -op Vital signs reviewed and stable  Post vital signs: Reviewed and stable  Complications: No apparent anesthesia complications

## 2011-01-28 NOTE — H&P (View-Only) (Signed)
Chief Complaint  Patient presents with  . Parathyroid Adenoma    referral from Dr. Jeffrey Kerr    HISTORY: Patient is a 50-year-old white female referred by her endocrinologist for consideration for minimally invasive parathyroidectomy. Patient has been followed closely for multiple medical concerns. Laboratory studies over the past one to 2 years have shown a persistently elevated serum calcium level. Recent calcium levels were measured at 10.6 with an ionized calcium of 5.8. Intact PTH level was checked and was in the mid normal range at 34. However given the serum calcium level this did not appear to be normally suppressed. Therefore a 24-hour urine collection for calcium was obtained and was elevated at 268. A nuclear medicine parathyroid scan was performed which localized abnormal uptake in the left inferior position. Patient's vitamin D level was normal at 42. Patient is now referred for consideration for minimally invasive left inferior parathyroidectomy.  Patient does have a family history of hypothyroidism in herself, her sisters, and her mother. There is no family history of other endocrinopathy. Patient has had a tonsillectomy but no other head or neck surgery has been performed.   Past Medical History  Diagnosis Date  . Hyperparathyroidism   . Hashimoto thyroiditis   . Bell's palsy   . Lyme disease   . Depression   . Asthma   . Migraines   . Hiatal hernia   . Fibroids   . Neuropathy     hands and feet  . HSV-2 (herpes simplex virus 2) infection   . Esophageal reflux   . Scoliosis   . Osteoarthritis   . Vitamin D deficiency   . Fibromyalgia   . Fibrocystic breast disease   . Internal hemorrhoids   . Allergic rhinitis   . Cat-scratch disease   . History of babesiosis   . Arrhythmia      Current Outpatient Prescriptions  Medication Sig Dispense Refill  . acyclovir (ZOVIRAX) 200 MG capsule Take by mouth every 4 (four) hours while awake.        . beclomethasone  (QVAR) 80 MCG/ACT inhaler Inhale 1 puff into the lungs as needed.        . cholecalciferol (VITAMIN D) 400 UNITS TABS Take by mouth.        . clarithromycin (BIAXIN) 500 MG tablet BID times 48H.      . clindamycin (CLEOCIN) 300 MG capsule BID times 48H.      . clonazePAM (KLONOPIN) 0.5 MG tablet Take 0.5 mg by mouth 2 (two) times daily as needed.        . cyclobenzaprine (FLEXERIL) 10 MG tablet Take 10 mg by mouth 3 (three) times daily as needed.        . DULoxetine (CYMBALTA) 60 MG capsule Take 60 mg by mouth daily.        . fluconazole (DIFLUCAN) 150 MG tablet Take 150 mg by mouth once. Please check dose       . gabapentin (NEURONTIN) 300 MG capsule Take 300 mg by mouth as needed.       . HYDROcodone-acetaminophen (VICODIN ES) 7.5-750 MG per tablet Take 1 tablet by mouth every 6 (six) hours as needed.        . hydrocortisone (CORTEF) 5 MG tablet daily.      . hydroxychloroquine (PLAQUENIL) 200 MG tablet Take by mouth daily.        . levalbuterol (XOPENEX) 0.63 MG/3ML nebulizer solution Take 1 ampule by nebulization every 4 (four) hours as needed.        .   levocetirizine (XYZAL) 5 MG tablet       . montelukast (SINGULAIR) 10 MG tablet       . Naltrexone HCl POWD by Does not apply route.        . nystatin (MYCOSTATIN) 500000 UNITS TABS Take 1 tablet by mouth every 8 (eight) hours.        . Probiotic Product (PRO-BIOTIC BLEND PO) Take by mouth.        . progesterone (PROMETRIUM) 100 MG capsule Take 100 mg by mouth daily. Please check dose      . thyroid (ARMOUR) 120 MG tablet Take 45 mg by mouth daily.        . traMADol (ULTRAM-ER) 200 MG 24 hr tablet       . traZODone (DESYREL) 50 MG tablet Take 50 mg by mouth at bedtime.        . vitamin B-12 (CYANOCOBALAMIN) 1000 MCG tablet 1,000 mcg by Other route daily.        . VIVELLE-DOT 0.075 MG/24HR          Allergies  Allergen Reactions  . Flagyl (Metronidazole Hcl)   . Iodine   . Iohexol      Code: HIVES, Desc: pt. states had hives post  contrast injection for c.t. scan.   . Synthroid   . Tindamax      Family History  Problem Relation Age of Onset  . Mitral valve prolapse Father   . Hypertension Father   . Dementia Father   . Hypothyroidism Mother   . Colon polyps Brother   . Aneurysm Brother   . Arthritis Brother   . Lupus Sister   . Rheum arthritis Sister   . Osteoarthritis Sister   . Cancer Maternal Aunt     breast     History   Social History  . Marital Status: Married    Spouse Name: N/A    Number of Children: N/A  . Years of Education: N/A   Social History Main Topics  . Smoking status: Never Smoker   . Smokeless tobacco: None  . Alcohol Use: No  . Drug Use: No  . Sexually Active:    Other Topics Concern  . None   Social History Narrative  . None     REVIEW OF SYSTEMS - PERTINENT POSITIVES ONLY: No history of nephrolithiasis. No history of osteoporosis. Moderate fatigue.   EXAM: Filed Vitals:   01/08/11 1358  BP: 122/88  Pulse: 80  Temp: 97 F (36.1 C)  Resp: 12    HEENT: normocephalic; pupils equal and reactive; sclerae clear; dentition good; mucous membranes moist NECK:  No palpable masses or nodules; symmetric on extension; no palpable anterior or posterior cervical lymphadenopathy; no supraclavicular masses; no tenderness CHEST: clear to auscultation bilaterally without rales, rhonchi, or wheezes CARDIAC: regular rate and rhythm without significant murmur; peripheral pulses are full EXT:  non-tender without edema; no deformity NEURO: no gross focal deficits; no sign of tremor   LABORATORY RESULTS: See E-Chart for most recent results   RADIOLOGY RESULTS: See E-Chart or I-Site for most recent results   IMPRESSION: Hypercalcemia, likely left inferior parathyroid adenoma   PLAN: I had a lengthy discussion with the patient and her husband. They recorded our conversation in its entirety. I discussed primary hyperparathyroidism. We reviewed all of her laboratory  results and diagnostic studies. I agree with the recommendation of her gynecologist that she undergo a minimally invasive parathyroidectomy. We discussed the possibility of success. We discussed the possibility of   a second gland adenoma. We discussed the possibility of repeat surgery or her performing a more extensive procedure at the time of exploration. We discussed potential complications including injury to recurrent laryngeal nerves or other structures in the neck. We discussed the surgical wound to be anticipated. We discussed the hospital stay and her postoperative recovery. They understand and wish to proceed.  The risks and benefits of the procedure have been discussed at length with the patient.  The patient understands the proposed procedure, potential alternative treatments, and the course of recovery to be expected.  All of the patient's questions have been answered at this time.  The patient wishes to proceed with surgery and will schedule a date for their procedure through our office staff.   Kennan Detter M. Nikiyah Fackler, MD, FACS General & Endocrine Surgery [facility]    Visit Diagnoses: 1. Hyperparathyroidism, primary     Primary Care Physician: READE,ROBERT ALEXANDER, MD  Endo:  Dr. Jeffrey Kerr  

## 2011-01-28 NOTE — Interval H&P Note (Signed)
History and Physical Interval Note:  01/28/2011 7:16 AM  [name]  has presented today for surgery, with the diagnosis of hyperparathyroidism. The various methods of treatment have been discussed with the patient. After consideration of risks, benefits and other options for treatment, the patient has consented to Procedure(s):  PARATHYROIDECTOMY as a surgical intervention. The patients' history has been reviewed, patient examined, no change in status, stable for surgery.  I have reviewed the patients' chart and labs.  Questions were answered to the patient's satisfaction.    Velora Heckler, MD, FACS General & Endocrine Surgery [facility]  Adaley Kiene Judie Petit

## 2011-01-28 NOTE — Anesthesia Postprocedure Evaluation (Signed)
  Anesthesia Post-op Note  Patient: [name]  Procedure(s) Performed:  PARATHYROIDECTOMY  Patient Location: PACU  Anesthesia Type: General  Level of Consciousness: awake and alert   Airway and Oxygen Therapy: Patient Spontanous Breathing  Post-op Pain: mild  Post-op Assessment: Post-op Vital signs reviewed, Patient's Cardiovascular Status Stable, Respiratory Function Stable, Patent Airway and No signs of Nausea or vomiting  Post-op Vital Signs: stable  Complications: No apparent anesthesia complications

## 2011-01-28 NOTE — Progress Notes (Signed)
Up to BR with help and voided well 

## 2011-01-29 ENCOUNTER — Telehealth (INDEPENDENT_AMBULATORY_CARE_PROVIDER_SITE_OTHER): Payer: Self-pay | Admitting: Surgery

## 2011-01-29 ENCOUNTER — Other Ambulatory Visit (INDEPENDENT_AMBULATORY_CARE_PROVIDER_SITE_OTHER): Payer: Self-pay | Admitting: Surgery

## 2011-01-30 ENCOUNTER — Encounter (HOSPITAL_COMMUNITY): Payer: Self-pay | Admitting: Surgery

## 2011-02-05 ENCOUNTER — Telehealth (INDEPENDENT_AMBULATORY_CARE_PROVIDER_SITE_OTHER): Payer: Self-pay

## 2011-02-05 DIAGNOSIS — E892 Postprocedural hypoparathyroidism: Secondary | ICD-10-CM

## 2011-02-05 DIAGNOSIS — Z9889 Other specified postprocedural states: Secondary | ICD-10-CM

## 2011-02-05 NOTE — Telephone Encounter (Signed)
Patient called stating she's having some mild post surgical right hand numbness occasionally radiates to the left hand, patient states she's taking her multi-vitamin and not currently taking a calcium supplement.  Per Dr. Gerrit Friends patient need's to take a calcium supplement 3 times a day also he would like to get a STAT calcium serum.  Orders have been sent to [facility].  Left a voice message for patient to call our office.

## 2011-02-08 ENCOUNTER — Telehealth (INDEPENDENT_AMBULATORY_CARE_PROVIDER_SITE_OTHER): Payer: Self-pay

## 2011-02-08 NOTE — Telephone Encounter (Signed)
Called patient to give lab results

## 2011-02-08 NOTE — Telephone Encounter (Signed)
Patient aware. She stated she has not taken any calcium supplements to date.

## 2011-02-21 ENCOUNTER — Encounter (INDEPENDENT_AMBULATORY_CARE_PROVIDER_SITE_OTHER): Payer: Medicare Other | Admitting: Surgery

## 2011-03-06 ENCOUNTER — Encounter (INDEPENDENT_AMBULATORY_CARE_PROVIDER_SITE_OTHER): Payer: Self-pay | Admitting: Surgery

## 2011-03-06 ENCOUNTER — Ambulatory Visit (INDEPENDENT_AMBULATORY_CARE_PROVIDER_SITE_OTHER): Payer: 59 | Admitting: Surgery

## 2011-03-06 VITALS — BP 118/80 | HR 76 | Temp 97.2°F | Resp 16 | Ht 64.0 in | Wt 175.2 lb

## 2011-03-06 DIAGNOSIS — M2550 Pain in unspecified joint: Secondary | ICD-10-CM | POA: Diagnosis not present

## 2011-03-06 DIAGNOSIS — R300 Dysuria: Secondary | ICD-10-CM | POA: Diagnosis not present

## 2011-03-06 DIAGNOSIS — R5381 Other malaise: Secondary | ICD-10-CM | POA: Diagnosis not present

## 2011-03-06 DIAGNOSIS — E210 Primary hyperparathyroidism: Secondary | ICD-10-CM | POA: Diagnosis not present

## 2011-03-06 DIAGNOSIS — R5383 Other fatigue: Secondary | ICD-10-CM | POA: Diagnosis not present

## 2011-03-06 DIAGNOSIS — Z79899 Other long term (current) drug therapy: Secondary | ICD-10-CM | POA: Diagnosis not present

## 2011-03-06 NOTE — Progress Notes (Signed)
Visit Diagnoses: 1. Hyperparathyroidism, primary     HISTORY: Patient returns for postoperative wound check. She underwent minimally invasive parathyroidectomy in early December. Follow up serum calcium level is normal at 9.4.  EXAM: Wound is well healed. Minimal soft tissue swelling. Cosmetic result will be good. It was quality is normal.  IMPRESSION: Status post minimally invasive parathyroidectomy with normalization of serum calcium level.  PLAN: Patient will begin applying topical creams to her incision. Will check a calcium level and an intact PTH level today. Will call results when available.  She will return to see me as needed.  Velora Heckler, MD, FACS General & Endocrine Surgery [facility]

## 2011-03-06 NOTE — Patient Instructions (Signed)
  COCOA BUTTER & VITAMIN E CREAM  ([employer] or other brand)  Apply cocoa butter/vitamin E cream to your incision 2 - 3 times daily.  Massage cream into incision for one minute with each application.  Use sunscreen (50 SPF or higher) for first 6 months after surgery.  You may substitute Mederma or other scar reducing creams as desired.   

## 2011-03-07 LAB — PTH, INTACT AND CALCIUM: Calcium, Total (PTH): 8.7 mg/dL (ref 8.4–10.5)

## 2011-03-08 ENCOUNTER — Other Ambulatory Visit (INDEPENDENT_AMBULATORY_CARE_PROVIDER_SITE_OTHER): Payer: Self-pay | Admitting: Surgery

## 2011-03-08 NOTE — Progress Notes (Signed)
Left message on voicemail- lab form mailed.

## 2011-03-15 DIAGNOSIS — Z79899 Other long term (current) drug therapy: Secondary | ICD-10-CM | POA: Diagnosis not present

## 2011-03-15 DIAGNOSIS — A6920 Lyme disease, unspecified: Secondary | ICD-10-CM | POA: Diagnosis not present

## 2011-03-15 DIAGNOSIS — T5694XA Toxic effect of unspecified metal, undetermined, initial encounter: Secondary | ICD-10-CM | POA: Diagnosis not present

## 2011-04-12 DIAGNOSIS — A6920 Lyme disease, unspecified: Secondary | ICD-10-CM | POA: Diagnosis not present

## 2011-04-12 DIAGNOSIS — T5694XA Toxic effect of unspecified metal, undetermined, initial encounter: Secondary | ICD-10-CM | POA: Diagnosis not present

## 2011-04-12 DIAGNOSIS — Z79899 Other long term (current) drug therapy: Secondary | ICD-10-CM | POA: Diagnosis not present

## 2011-05-08 ENCOUNTER — Other Ambulatory Visit: Payer: Self-pay | Admitting: Obstetrics and Gynecology

## 2011-05-08 DIAGNOSIS — Z1231 Encounter for screening mammogram for malignant neoplasm of breast: Secondary | ICD-10-CM

## 2011-05-08 DIAGNOSIS — A6920 Lyme disease, unspecified: Secondary | ICD-10-CM | POA: Diagnosis not present

## 2011-05-08 DIAGNOSIS — Z79899 Other long term (current) drug therapy: Secondary | ICD-10-CM | POA: Diagnosis not present

## 2011-05-08 DIAGNOSIS — T5694XA Toxic effect of unspecified metal, undetermined, initial encounter: Secondary | ICD-10-CM | POA: Diagnosis not present

## 2011-05-20 ENCOUNTER — Ambulatory Visit
Admission: RE | Admit: 2011-05-20 | Discharge: 2011-05-20 | Disposition: A | Discharge status: Home / Self Care | Payer: 59 | Source: Ambulatory Visit | Attending: Obstetrics and Gynecology | Admitting: Obstetrics and Gynecology

## 2011-05-20 DIAGNOSIS — Z1231 Encounter for screening mammogram for malignant neoplasm of breast: Secondary | ICD-10-CM

## 2011-06-06 DIAGNOSIS — Z79899 Other long term (current) drug therapy: Secondary | ICD-10-CM | POA: Diagnosis not present

## 2011-06-06 DIAGNOSIS — E039 Hypothyroidism, unspecified: Secondary | ICD-10-CM | POA: Diagnosis not present

## 2011-06-06 DIAGNOSIS — A6920 Lyme disease, unspecified: Secondary | ICD-10-CM | POA: Diagnosis not present

## 2011-06-06 DIAGNOSIS — T5694XA Toxic effect of unspecified metal, undetermined, initial encounter: Secondary | ICD-10-CM | POA: Diagnosis not present

## 2011-07-12 DIAGNOSIS — T5694XA Toxic effect of unspecified metal, undetermined, initial encounter: Secondary | ICD-10-CM | POA: Diagnosis not present

## 2011-07-12 DIAGNOSIS — Z79899 Other long term (current) drug therapy: Secondary | ICD-10-CM | POA: Diagnosis not present

## 2011-07-12 DIAGNOSIS — A6920 Lyme disease, unspecified: Secondary | ICD-10-CM | POA: Diagnosis not present

## 2011-07-14 DIAGNOSIS — R5382 Chronic fatigue, unspecified: Secondary | ICD-10-CM | POA: Diagnosis not present

## 2011-07-14 DIAGNOSIS — G9332 Myalgic encephalomyelitis/chronic fatigue syndrome: Secondary | ICD-10-CM | POA: Diagnosis not present

## 2011-07-16 DIAGNOSIS — M542 Cervicalgia: Secondary | ICD-10-CM | POA: Diagnosis not present

## 2011-07-16 DIAGNOSIS — G4700 Insomnia, unspecified: Secondary | ICD-10-CM | POA: Diagnosis not present

## 2011-07-16 DIAGNOSIS — M76899 Other specified enthesopathies of unspecified lower limb, excluding foot: Secondary | ICD-10-CM | POA: Diagnosis not present

## 2011-07-16 DIAGNOSIS — IMO0001 Reserved for inherently not codable concepts without codable children: Secondary | ICD-10-CM | POA: Diagnosis not present

## 2011-07-17 DIAGNOSIS — M542 Cervicalgia: Secondary | ICD-10-CM | POA: Diagnosis not present

## 2011-07-19 DIAGNOSIS — E039 Hypothyroidism, unspecified: Secondary | ICD-10-CM | POA: Diagnosis not present

## 2011-08-01 DIAGNOSIS — M542 Cervicalgia: Secondary | ICD-10-CM | POA: Diagnosis not present

## 2011-08-08 ENCOUNTER — Encounter: Payer: Self-pay | Admitting: *Deleted

## 2011-08-08 ENCOUNTER — Encounter: Payer: Self-pay | Admitting: Internal Medicine

## 2011-08-08 ENCOUNTER — Ambulatory Visit (INDEPENDENT_AMBULATORY_CARE_PROVIDER_SITE_OTHER): Payer: 59 | Admitting: Internal Medicine

## 2011-08-08 VITALS — BP 113/80 | HR 89 | Ht 64.0 in | Wt 164.0 lb

## 2011-08-08 DIAGNOSIS — R42 Dizziness and giddiness: Secondary | ICD-10-CM

## 2011-08-08 NOTE — Progress Notes (Signed)
 CARDIOLOGY CONSULT NOTE  Patient ID: [name], MRN: [id number], DOB/AGE: 05/14/1960 51 y.o. Admit date: (Not on file) Date of Consult: 08/08/2011  Primary Physician: READE,ROBERT ALEXANDER, MD Primary Cardiologist: new  Chief Complaint: tilt table test   HPI [name] is a 51 y.o. female : \ Referred by a doctor in [place] who specializes in chronic Lyme disease for tilt table testing.  The patient is very well informed and relates a history of a tick bite in 1990 and a subsequent development of symptoms but has been attributed to chronic Lyme disease, chronic cat scratch fever, possibly occult parasitic infection. She has had remote syncope related to urination. She has had a history of some low blood pressures. She does not have heat intolerance. She does not have shower intolerance. She does not have orthostatic intolerance. Indeed she brings with her 2 weeks of orthostatic blood pressures which are stable without evidence of orthostatic drop.  She is seen by this physician in [place] because he is the only one that she has been able to find understands chronic Lyme disease. Apparently Dr. Steele who described the disease a Gail does not understand chronic Lyme disease. [facility] has also struggled to understand chronic Lyme disease. And is frequently apparently associated with pots which prompts the referral for tilt table testing  Past Medical History  Diagnosis Date  . Hyperparathyroidism   . Hashimoto thyroiditis   . Bell's palsy   . Lyme disease   . Depression   . Asthma   . Migraines   . Hiatal hernia   . Fibroids   . Neuropathy     hands and feet  . HSV-2 (herpes simplex virus 2) infection   . Esophageal reflux   . Scoliosis   . Osteoarthritis   . Vitamin d deficiency   . Fibromyalgia   . Fibrocystic breast disease   . Internal hemorrhoids   . Allergic rhinitis   . Cat-scratch disease   . History of babesiosis   . Hypertension    hx of several years ago   . Arrhythmia   . Neuropathy   . Hypoglycemia   . Blood dyscrasia     hx of nosebleeds 2005   . Chronic kidney disease     hx of uti       Surgical History:  Past Surgical History  Procedure Date  . Myomectomy   . Tonsillectomy   . Uterine cyst     removed 5 times  . Cystectomy     hand  . Parathyroidectomy 01/28/2011    Procedure: PARATHYROIDECTOMY;  Surgeon: Todd M Gerkin, MD;  Location: [facility];  Service: General;  Laterality: N/A;     Home Meds: Prior to Admission medications   Medication Sig Start Date End Date Taking? Authorizing Provider  atovaquone-proguanil (MALARONE) 250-100 MG TABS as directed. 08/01/11  Yes Historical Provider, MD  beclomethasone (QVAR) 80 MCG/ACT inhaler Inhale 1 puff into the lungs daily as needed. For allergies   Yes Historical Provider, MD  clarithromycin (BIAXIN) 500 MG tablet Take 500 mg by mouth 2 (two) times daily. On sundays, mondays, Thursday, Friday and saturdays. 01/02/11  Yes Historical Provider, MD  clindamycin (CLEOCIN) 300 MG capsule as directed. 07/24/11  Yes Historical Provider, MD  clonazePAM (KLONOPIN) 0.5 MG tablet Take 0.5 mg by mouth at bedtime.    Yes Historical Provider, MD  cyanocobalamin (,VITAMIN B-12,) 1000 MCG/ML injection Inject 1,000 mcg into the muscle daily.      Yes Historical Provider, MD  cyclobenzaprine (FLEXERIL) 10 MG tablet Take 10 mg by mouth 3 (three) times daily as needed. For muscle spasms   Yes Historical Provider, MD  DULoxetine (CYMBALTA) 60 MG capsule Take 60 mg by mouth every morning.    Yes Historical Provider, MD  fluconazole (DIFLUCAN) 150 MG tablet Take 150 mg by mouth 2 (two) times daily. Takes on Tuesday and thursdays.   Yes Historical Provider, MD  fluconazole (DIFLUCAN) 200 MG tablet Take 200 mg by mouth 2 (two) times daily. On tuesdays and thursdays    Yes Historical Provider, MD  gabapentin (NEURONTIN) 300 MG capsule Take 300 mg by mouth at bedtime.    Yes Historical Provider,  MD  GLUTATHIONE PO Take 1 tablet by mouth daily.    Yes Historical Provider, MD  Grapefruit Seed 60 % EXTR Take 1 tablet by mouth 2 (two) times daily.    Yes Historical Provider, MD  hydrocortisone (CORTEF) 5 MG tablet Take 5 mg by mouth daily.  12/24/10  Yes Historical Provider, MD  hydroxychloroquine (PLAQUENIL) 200 MG tablet Take 200 mg by mouth 2 (two) times daily.    Yes Historical Provider, MD  levalbuterol (XOPENEX HFA) 45 MCG/ACT inhaler Inhale 1 puff into the lungs every 4 (four) hours as needed. For shortness of breath   Yes Historical Provider, MD  levocetirizine (XYZAL) 5 MG tablet Take 5 mg by mouth daily.  12/21/10  Yes Historical Provider, MD  MAGNESIUM CHLORIDE PO Take 1 tablet by mouth daily.    Yes Historical Provider, MD  montelukast (SINGULAIR) 10 MG tablet Take 10 mg by mouth at bedtime.  12/21/10  Yes Historical Provider, MD  Multiple Vitamins-Minerals (MULTIVITAMINS THER. W/MINERALS) TABS Take 1 tablet by mouth daily.    Yes Historical Provider, MD  nystatin (MYCOSTATIN) 500000 UNITS TABS Take 2 tablets by mouth 2 (two) times daily.    Yes Historical Provider, MD  OVER THE COUNTER MEDICATION Take 1 tablet by mouth 2 (two) times daily. serra peptase 500mg   Yes Historical Provider, MD  PRESCRIPTION MEDICATION Take 3-4 drops by mouth daily. abart 0.04mg/drop   Yes Historical Provider, MD  Probiotic Product (PRO-BIOTIC BLEND PO) Take 2 tablets by mouth daily. Takes 3 different kinds of probiotic   Yes Historical Provider, MD  progesterone (PROMETRIUM) 100 MG capsule Take 100 mg by mouth daily.    Yes Historical Provider, MD  thyroid (ARMOUR) 120 MG tablet Take 45 mg by mouth every morning.    Yes Historical Provider, MD  traMADol (ULTRAM-ER) 200 MG 24 hr tablet Take 200-400 mg by mouth daily.  01/02/11  Yes Historical Provider, MD  traZODone (DESYREL) 100 MG tablet Take 100 mg by mouth at bedtime.    Yes Historical Provider, MD  VIVELLE-DOT 0.075 MG/24HR Place 1 patch onto the  skin 2 (two) times a week. Changes on wednesdays and fridays 12/26/10  Yes Historical Provider, MD    Inpatient Medications:     Allergies:  Allergies  Allergen Reactions  . Eggs Or Egg-Derived Products Other (See Comments)    Doesn't prefer to eat  . Iodine Hives  . Iohexol      Code: HIVES, Desc: pt. states had hives post contrast injection for c.t. scan.   . Levothyroxine Sodium Other (See Comments)    Doesn't work  . Flagyl (Metronidazole Hcl) Rash  . Tinidazole Rash    History   Social History  . Marital Status: Married    Spouse Name: N/A      Number of Children: N/A  . Years of Education: N/A   Occupational History  . Not on file.   Social History Main Topics  . Smoking status: Never Smoker   . Smokeless tobacco: Never Used  . Alcohol Use: Yes     occasional wine   . Drug Use: No  . Sexually Active:    Other Topics Concern  . Not on file   Social History Narrative  . No narrative on file     Family History  Problem Relation Age of Onset  . Mitral valve prolapse Father   . Hypertension Father   . Dementia Father   . Hypothyroidism Mother   . Colon polyps Brother   . Aneurysm Brother   . Arthritis Brother   . Lupus Sister   . Rheum arthritis Sister   . Osteoarthritis Sister   . Cancer Maternal Aunt     breast     ROS:  Please see the history of present illness.     All other systems reviewed and negative.    Physical Exam: Blood pressure 113/80, pulse 89, height 5' 4" (1.626 m), weight 164 lb (74.39 kg). General: Well developed, well nourished female in no acute distress. Head: Normocephalic, atraumatic, sclera non-icteric, no xanthomas, nares are without discharge. Lymph Nodes:  none Neck: Negative for carotid bruits. JVD not elevated. Lungs: Clear bilaterally to auscultation without wheezes, rales, or rhonchi. Breathing is unlabored. Heart: RRR with S1 S2. No murmurs, rubs, or gallops appreciated. Abdomen: Soft, non-tender, non-distended  with normoactive bowel sounds. No hepatomegaly. No rebound/guarding. No obvious abdominal masses. Msk:  Strength and tone appear normal for age. Extremities: No clubbing or cyanosis. No edema.  Distal pedal pulses are 2+ and equal bilaterally. Skin: Warm and Dry Neuro: Alert and oriented X 3. CN III-XII intact Grossly normal sensory and motor function . Psych:  Responds to questions appropriately with a normal but sad affect.      Labs: Cardiac Enzymes No results found for this basename: CKTOTAL:4,CKMB:4,TROPONINI:4 in the last 72 hours CBC Lab Results  Component Value Date   WBC 3.8* 01/24/2011   HGB 12.8 01/24/2011   HCT 38.4 01/24/2011   MCV 92.1 01/24/2011   PLT 167 01/24/2011     EKG:    Assessment and Plan:   Wendell Nicoson  

## 2011-08-08 NOTE — Assessment & Plan Note (Signed)
The patient has a very complicated medical history into which I have little insight. She carries a diagnosis from a physician in [place] of chronic Lyme disease, chronic cat scratch disease, possible occult parasites, and because of the first of these which is apparently associated with postural orthostatic tachycardia she is referred for tilt table testing.  She's been told, orders written on her referral, of dysautonomia. She denies tachypalpitations. She does have an episode of post micturition syncope. She does not have shower intolerance or heat intolerance. She does not have significant orthostatic dizziness.  To help facilitate the management of her problems for her physician in [place], we'll undertake tilt table testing.

## 2011-08-08 NOTE — Addendum Note (Signed)
Addended by: Duke Salvia on: 08/08/2011 05:31 PM   Modules accepted: Orders

## 2011-08-08 NOTE — Patient Instructions (Signed)
Your physician has recommended that you have a tilt table test. This test is sometimes used to help determine the cause of fainting spells. You lie on a table that moves from a lying down to an upright position. The change in position can bring on loss of consciousness. The doctor monitors your symptoms, heart rate, EKG, and blood pressure throughout the test. The doctor also may give you a medicine and then monitor your response to the medicine. This is done in the hospital and usually takes half of a day to complete the procedure. Please see the instruction sheet given to you today for more information.   

## 2011-08-12 DIAGNOSIS — IMO0001 Reserved for inherently not codable concepts without codable children: Secondary | ICD-10-CM | POA: Diagnosis not present

## 2011-08-12 DIAGNOSIS — M25559 Pain in unspecified hip: Secondary | ICD-10-CM | POA: Diagnosis not present

## 2011-08-13 ENCOUNTER — Other Ambulatory Visit (INDEPENDENT_AMBULATORY_CARE_PROVIDER_SITE_OTHER): Payer: Self-pay | Admitting: Surgery

## 2011-08-13 DIAGNOSIS — M76899 Other specified enthesopathies of unspecified lower limb, excluding foot: Secondary | ICD-10-CM | POA: Diagnosis not present

## 2011-08-13 DIAGNOSIS — E213 Hyperparathyroidism, unspecified: Secondary | ICD-10-CM | POA: Diagnosis not present

## 2011-08-16 ENCOUNTER — Encounter (HOSPITAL_COMMUNITY): Payer: Self-pay | Admitting: Pharmacy Technician

## 2011-08-16 ENCOUNTER — Telehealth (INDEPENDENT_AMBULATORY_CARE_PROVIDER_SITE_OTHER): Payer: Self-pay

## 2011-08-16 NOTE — Telephone Encounter (Signed)
Per Dr Sid Falcon request pt advised lab results are good and to f/u with our office prn.

## 2011-08-21 ENCOUNTER — Encounter (HOSPITAL_COMMUNITY)
Admission: RE | Disposition: A | Discharge status: Home / Self Care | Payer: Self-pay | Source: Ambulatory Visit | Attending: Internal Medicine

## 2011-08-21 ENCOUNTER — Ambulatory Visit (HOSPITAL_COMMUNITY)
Admission: RE | Admit: 2011-08-21 | Discharge: 2011-08-21 | Disposition: A | Discharge status: Home / Self Care | Payer: 59 | Source: Ambulatory Visit | Attending: Internal Medicine | Admitting: Internal Medicine

## 2011-08-21 DIAGNOSIS — A6920 Lyme disease, unspecified: Secondary | ICD-10-CM | POA: Insufficient documentation

## 2011-08-21 DIAGNOSIS — R42 Dizziness and giddiness: Secondary | ICD-10-CM

## 2011-08-21 DIAGNOSIS — R55 Syncope and collapse: Secondary | ICD-10-CM

## 2011-08-21 DIAGNOSIS — E210 Primary hyperparathyroidism: Secondary | ICD-10-CM

## 2011-08-21 HISTORY — PX: TILT TABLE STUDY: SHX5493

## 2011-08-21 LAB — GLUCOSE, CAPILLARY: Glucose-Capillary: 80 mg/dL (ref 70–99)

## 2011-08-21 SURGERY — TILT TABLE STUDY
Anesthesia: LOCAL

## 2011-08-21 MED ORDER — DEXTROSE-NACL 5-0.9 % IV SOLN: INTRAVENOUS | Status: DC

## 2011-08-21 MED ORDER — SODIUM CHLORIDE 0.9 % IV SOLN
INTRAVENOUS | Status: DC
  Administered 2011-08-21: 09:00:00 via INTRAVENOUS

## 2011-08-21 NOTE — H&P (View-Only) (Signed)
CARDIOLOGY CONSULT NOTE  Patient ID: [name], MRN: [id number], DOB/AGE: 1960-04-01 52 y.o. Admit date: (Not on file) Date of Consult: 08/08/2011  Primary Physician: Lolita Patella, MD Primary Cardiologist: new  Chief Complaint: tilt table test   HPI [name] is a 51 y.o. female : \ Referred by a doctor in [place] who specializes in chronic Lyme disease for tilt table testing.  The patient is very well informed and relates a history of a tick bite in 1990 and a subsequent development of symptoms but has been attributed to chronic Lyme disease, chronic cat scratch fever, possibly occult parasitic infection. She has had remote syncope related to urination. She has had a history of some low blood pressures. She does not have heat intolerance. She does not have shower intolerance. She does not have orthostatic intolerance. Indeed she brings with her 2 weeks of orthostatic blood pressures which are stable without evidence of orthostatic drop.  She is seen by this physician in [place] because he is the only one that she has been able to find understands chronic Lyme disease. Apparently Dr. Christin Fudge who described the disease a Dondra Spry does not understand chronic Lyme disease. [facility] has also struggled to understand chronic Lyme disease. And is frequently apparently associated with pots which prompts the referral for tilt table testing  Past Medical History  Diagnosis Date  . Hyperparathyroidism   . Hashimoto thyroiditis   . Bell's palsy   . Lyme disease   . Depression   . Asthma   . Migraines   . Hiatal hernia   . Fibroids   . Neuropathy     hands and feet  . HSV-2 (herpes simplex virus 2) infection   . Esophageal reflux   . Scoliosis   . Osteoarthritis   . Vitamin d deficiency   . Fibromyalgia   . Fibrocystic breast disease   . Internal hemorrhoids   . Allergic rhinitis   . Cat-scratch disease   . History of babesiosis   . Hypertension    hx of several years ago   . Arrhythmia   . Neuropathy   . Hypoglycemia   . Blood dyscrasia     hx of nosebleeds 2005   . Chronic kidney disease     hx of uti       Surgical History:  Past Surgical History  Procedure Date  . Myomectomy   . Tonsillectomy   . Uterine cyst     removed 5 times  . Cystectomy     hand  . Parathyroidectomy 01/28/2011    Procedure: PARATHYROIDECTOMY;  Surgeon: Velora Heckler, MD;  Location: [facility];  Service: General;  Laterality: N/A;     Home Meds: Prior to Admission medications   Medication Sig Start Date End Date Taking? Authorizing Provider  atovaquone-proguanil (MALARONE) 250-100 MG TABS as directed. 08/01/11  Yes Historical Provider, MD  beclomethasone (QVAR) 80 MCG/ACT inhaler Inhale 1 puff into the lungs daily as needed. For allergies   Yes Historical Provider, MD  clarithromycin (BIAXIN) 500 MG tablet Take 500 mg by mouth 2 (two) times daily. On sundays, mondays, Thursday, Friday and saturdays. 01/02/11  Yes Historical Provider, MD  clindamycin (CLEOCIN) 300 MG capsule as directed. 07/24/11  Yes Historical Provider, MD  clonazePAM (KLONOPIN) 0.5 MG tablet Take 0.5 mg by mouth at bedtime.    Yes Historical Provider, MD  cyanocobalamin (,VITAMIN B-12,) 1000 MCG/ML injection Inject 1,000 mcg into the muscle daily.  Yes Historical Provider, MD  cyclobenzaprine (FLEXERIL) 10 MG tablet Take 10 mg by mouth 3 (three) times daily as needed. For muscle spasms   Yes Historical Provider, MD  DULoxetine (CYMBALTA) 60 MG capsule Take 60 mg by mouth every morning.    Yes Historical Provider, MD  fluconazole (DIFLUCAN) 150 MG tablet Take 150 mg by mouth 2 (two) times daily. Takes on Tuesday and thursdays.   Yes Historical Provider, MD  fluconazole (DIFLUCAN) 200 MG tablet Take 200 mg by mouth 2 (two) times daily. On tuesdays and thursdays    Yes Historical Provider, MD  gabapentin (NEURONTIN) 300 MG capsule Take 300 mg by mouth at bedtime.    Yes Historical Provider,  MD  GLUTATHIONE PO Take 1 tablet by mouth daily.    Yes Historical Provider, MD  Grapefruit Seed 60 % EXTR Take 1 tablet by mouth 2 (two) times daily.    Yes Historical Provider, MD  hydrocortisone (CORTEF) 5 MG tablet Take 5 mg by mouth daily.  12/24/10  Yes Historical Provider, MD  hydroxychloroquine (PLAQUENIL) 200 MG tablet Take 200 mg by mouth 2 (two) times daily.    Yes Historical Provider, MD  levalbuterol (XOPENEX HFA) 45 MCG/ACT inhaler Inhale 1 puff into the lungs every 4 (four) hours as needed. For shortness of breath   Yes Historical Provider, MD  levocetirizine (XYZAL) 5 MG tablet Take 5 mg by mouth daily.  12/21/10  Yes Historical Provider, MD  MAGNESIUM CHLORIDE PO Take 1 tablet by mouth daily.    Yes Historical Provider, MD  montelukast (SINGULAIR) 10 MG tablet Take 10 mg by mouth at bedtime.  12/21/10  Yes Historical Provider, MD  Multiple Vitamins-Minerals (MULTIVITAMINS THER. W/MINERALS) TABS Take 1 tablet by mouth daily.    Yes Historical Provider, MD  nystatin (MYCOSTATIN) 500000 UNITS TABS Take 2 tablets by mouth 2 (two) times daily.    Yes Historical Provider, MD  OVER THE COUNTER MEDICATION Take 1 tablet by mouth 2 (two) times daily. serra peptase 500mg    Yes Historical Provider, MD  PRESCRIPTION MEDICATION Take 3-4 drops by mouth daily. abart 0.04mg /drop   Yes Historical Provider, MD  Probiotic Product (PRO-BIOTIC BLEND PO) Take 2 tablets by mouth daily. Takes 3 different kinds of probiotic   Yes Historical Provider, MD  progesterone (PROMETRIUM) 100 MG capsule Take 100 mg by mouth daily.    Yes Historical Provider, MD  thyroid (ARMOUR) 120 MG tablet Take 45 mg by mouth every morning.    Yes Historical Provider, MD  traMADol (ULTRAM-ER) 200 MG 24 hr tablet Take 200-400 mg by mouth daily.  01/02/11  Yes Historical Provider, MD  traZODone (DESYREL) 100 MG tablet Take 100 mg by mouth at bedtime.    Yes Historical Provider, MD  VIVELLE-DOT 0.075 MG/24HR Place 1 patch onto the  skin 2 (two) times a week. Changes on wednesdays and fridays 12/26/10  Yes Historical Provider, MD    Inpatient Medications:     Allergies:  Allergies  Allergen Reactions  . Eggs Or Egg-Derived Products Other (See Comments)    Doesn't prefer to eat  . Iodine Hives  . Iohexol      Code: HIVES, Desc: pt. states had hives post contrast injection for c.t. scan.   . Levothyroxine Sodium Other (See Comments)    Doesn't work  . Flagyl (Metronidazole Hcl) Rash  . Tinidazole Rash    History   Social History  . Marital Status: Married    Spouse Name: N/A  Number of Children: N/A  . Years of Education: N/A   Occupational History  . Not on file.   Social History Main Topics  . Smoking status: Never Smoker   . Smokeless tobacco: Never Used  . Alcohol Use: Yes     occasional wine   . Drug Use: No  . Sexually Active:    Other Topics Concern  . Not on file   Social History Narrative  . No narrative on file     Family History  Problem Relation Age of Onset  . Mitral valve prolapse Father   . Hypertension Father   . Dementia Father   . Hypothyroidism Mother   . Colon polyps Brother   . Aneurysm Brother   . Arthritis Brother   . Lupus Sister   . Rheum arthritis Sister   . Osteoarthritis Sister   . Cancer Maternal Aunt     breast     ROS:  Please see the history of present illness.     All other systems reviewed and negative.    Physical Exam: Blood pressure 113/80, pulse 89, height 5\' 4"  (1.626 m), weight 164 lb (74.39 kg). General: Well developed, well nourished female in no acute distress. Head: Normocephalic, atraumatic, sclera non-icteric, no xanthomas, nares are without discharge. Lymph Nodes:  none Neck: Negative for carotid bruits. JVD not elevated. Lungs: Clear bilaterally to auscultation without wheezes, rales, or rhonchi. Breathing is unlabored. Heart: RRR with S1 S2. No murmurs, rubs, or gallops appreciated. Abdomen: Soft, non-tender, non-distended  with normoactive bowel sounds. No hepatomegaly. No rebound/guarding. No obvious abdominal masses. Msk:  Strength and tone appear normal for age. Extremities: No clubbing or cyanosis. No edema.  Distal pedal pulses are 2+ and equal bilaterally. Skin: Warm and Dry Neuro: Alert and oriented X 3. CN III-XII intact Grossly normal sensory and motor function . Psych:  Responds to questions appropriately with a normal but sad affect.      Labs: Cardiac Enzymes No results found for this basename: CKTOTAL:4,CKMB:4,TROPONINI:4 in the last 72 hours CBC Lab Results  Component Value Date   WBC 3.8* 01/24/2011   HGB 12.8 01/24/2011   HCT 38.4 01/24/2011   MCV 92.1 01/24/2011   PLT 167 01/24/2011     EKG:    Assessment and Plan:   [name]

## 2011-08-21 NOTE — CV Procedure (Signed)
[name] [id number]  [id number]  Preop Dx: chronicLYME Postop Dx same/  Procedure:TILT    Dictation number Pt was equilitbrated in the supine position  She was then tilted to 70 degrees and HR/BP recorded every minute   HR at baseline was approx68-70 Peak HR was 105 at 20 min of tilt and the pt was then returned to the supine position  BP were consisten 110-117 through out   Sherryl Manges, MD 08/21/2011 11:11 AM

## 2011-08-21 NOTE — Interval H&P Note (Signed)
History and Physical Interval Note:  08/21/2011 8:40 AM  [name]  has presented today for surgery, with the diagnosis of Syncope  The various methods of treatment have been discussed with the patient and family. After consideration of risks, benefits and other options for treatment, the patient has consented to  Procedure(s) (LRB): TILT TABLE STUDY (N/A) as a surgical intervention .  The patient's history has been reviewed, patient examined, no change in status, stable for surgery.  I have reviewed the patients' chart and labs.  Questions were answered to the patient's satisfaction.     Sherryl Manges

## 2011-08-26 DIAGNOSIS — M545 Low back pain, unspecified: Secondary | ICD-10-CM | POA: Diagnosis not present

## 2011-08-26 DIAGNOSIS — M25559 Pain in unspecified hip: Secondary | ICD-10-CM | POA: Diagnosis not present

## 2011-08-26 DIAGNOSIS — IMO0001 Reserved for inherently not codable concepts without codable children: Secondary | ICD-10-CM | POA: Diagnosis not present

## 2011-08-26 DIAGNOSIS — M542 Cervicalgia: Secondary | ICD-10-CM | POA: Diagnosis not present

## 2011-08-28 DIAGNOSIS — IMO0001 Reserved for inherently not codable concepts without codable children: Secondary | ICD-10-CM | POA: Diagnosis not present

## 2011-08-28 DIAGNOSIS — M25559 Pain in unspecified hip: Secondary | ICD-10-CM | POA: Diagnosis not present

## 2011-08-28 DIAGNOSIS — M542 Cervicalgia: Secondary | ICD-10-CM | POA: Diagnosis not present

## 2011-09-02 DIAGNOSIS — IMO0001 Reserved for inherently not codable concepts without codable children: Secondary | ICD-10-CM | POA: Diagnosis not present

## 2011-09-02 DIAGNOSIS — M25559 Pain in unspecified hip: Secondary | ICD-10-CM | POA: Diagnosis not present

## 2011-09-10 DIAGNOSIS — IMO0001 Reserved for inherently not codable concepts without codable children: Secondary | ICD-10-CM | POA: Diagnosis not present

## 2011-09-10 DIAGNOSIS — M25559 Pain in unspecified hip: Secondary | ICD-10-CM | POA: Diagnosis not present

## 2011-09-13 DIAGNOSIS — IMO0001 Reserved for inherently not codable concepts without codable children: Secondary | ICD-10-CM | POA: Diagnosis not present

## 2011-09-13 DIAGNOSIS — M25559 Pain in unspecified hip: Secondary | ICD-10-CM | POA: Diagnosis not present

## 2011-09-17 DIAGNOSIS — IMO0001 Reserved for inherently not codable concepts without codable children: Secondary | ICD-10-CM | POA: Diagnosis not present

## 2011-09-17 DIAGNOSIS — M542 Cervicalgia: Secondary | ICD-10-CM | POA: Diagnosis not present

## 2011-09-17 DIAGNOSIS — M25559 Pain in unspecified hip: Secondary | ICD-10-CM | POA: Diagnosis not present

## 2011-09-19 DIAGNOSIS — M25559 Pain in unspecified hip: Secondary | ICD-10-CM | POA: Diagnosis not present

## 2011-09-19 DIAGNOSIS — IMO0001 Reserved for inherently not codable concepts without codable children: Secondary | ICD-10-CM | POA: Diagnosis not present

## 2011-09-24 DIAGNOSIS — M25559 Pain in unspecified hip: Secondary | ICD-10-CM | POA: Diagnosis not present

## 2011-09-24 DIAGNOSIS — M542 Cervicalgia: Secondary | ICD-10-CM | POA: Diagnosis not present

## 2011-09-26 DIAGNOSIS — M542 Cervicalgia: Secondary | ICD-10-CM | POA: Diagnosis not present

## 2011-09-26 DIAGNOSIS — M25559 Pain in unspecified hip: Secondary | ICD-10-CM | POA: Diagnosis not present

## 2011-10-01 DIAGNOSIS — M545 Low back pain, unspecified: Secondary | ICD-10-CM | POA: Diagnosis not present

## 2011-10-01 DIAGNOSIS — IMO0001 Reserved for inherently not codable concepts without codable children: Secondary | ICD-10-CM | POA: Diagnosis not present

## 2011-10-01 DIAGNOSIS — M542 Cervicalgia: Secondary | ICD-10-CM | POA: Diagnosis not present

## 2011-10-01 DIAGNOSIS — M25559 Pain in unspecified hip: Secondary | ICD-10-CM | POA: Diagnosis not present

## 2011-10-03 DIAGNOSIS — M545 Low back pain, unspecified: Secondary | ICD-10-CM | POA: Diagnosis not present

## 2011-10-03 DIAGNOSIS — M542 Cervicalgia: Secondary | ICD-10-CM | POA: Diagnosis not present

## 2011-10-03 DIAGNOSIS — M25559 Pain in unspecified hip: Secondary | ICD-10-CM | POA: Diagnosis not present

## 2011-10-08 DIAGNOSIS — M542 Cervicalgia: Secondary | ICD-10-CM | POA: Diagnosis not present

## 2011-10-08 DIAGNOSIS — M25559 Pain in unspecified hip: Secondary | ICD-10-CM | POA: Diagnosis not present

## 2011-10-10 DIAGNOSIS — M25559 Pain in unspecified hip: Secondary | ICD-10-CM | POA: Diagnosis not present

## 2011-10-10 DIAGNOSIS — M545 Low back pain, unspecified: Secondary | ICD-10-CM | POA: Diagnosis not present

## 2011-10-10 DIAGNOSIS — M542 Cervicalgia: Secondary | ICD-10-CM | POA: Diagnosis not present

## 2011-10-22 DIAGNOSIS — M545 Low back pain, unspecified: Secondary | ICD-10-CM | POA: Diagnosis not present

## 2011-10-22 DIAGNOSIS — M25559 Pain in unspecified hip: Secondary | ICD-10-CM | POA: Diagnosis not present

## 2011-10-22 DIAGNOSIS — M542 Cervicalgia: Secondary | ICD-10-CM | POA: Diagnosis not present

## 2011-11-21 ENCOUNTER — Ambulatory Visit: Payer: 59 | Admitting: Family Medicine

## 2011-11-21 VITALS — BP 123/75 | HR 69 | Temp 98.1°F | Resp 18 | Ht 64.5 in | Wt 164.0 lb

## 2011-11-21 DIAGNOSIS — R112 Nausea with vomiting, unspecified: Secondary | ICD-10-CM

## 2011-11-21 DIAGNOSIS — K529 Noninfective gastroenteritis and colitis, unspecified: Secondary | ICD-10-CM

## 2011-11-21 DIAGNOSIS — K5289 Other specified noninfective gastroenteritis and colitis: Secondary | ICD-10-CM

## 2011-11-21 MED ORDER — ONDANSETRON 4 MG PO TBDP
4.0000 mg | ORAL_TABLET | Freq: Once | ORAL | Status: AC
  Administered 2011-11-21: 4 mg via ORAL

## 2011-11-21 MED ORDER — CIPROFLOXACIN HCL 500 MG PO TABS: 500.0000 mg | ORAL_TABLET | Freq: Two times a day (BID) | ORAL | Status: DC

## 2011-11-21 MED ORDER — ONDANSETRON 4 MG PO TBDP: 4.0000 mg | ORAL_TABLET | Freq: Three times a day (TID) | ORAL | Status: DC | PRN

## 2011-11-21 NOTE — Progress Notes (Signed)
[facility]:  Office Visit  Chief Complaint:  Chief Complaint  Patient presents with  . Nausea  . Emesis  . Diarrhea    HPI: [name] is a 51 y.o. female who complains of  1 day h/o n/v abd pain and nonbloody diarrhea after eating chicken soup. No fevers or chills. 5 episodes of vomitus.Feels tired. She is currently dry heaving. She is able to take in liquids, but not so much solids.   Past Medical History  Diagnosis Date  . Hyperparathyroidism   . Hashimoto thyroiditis   . Bell's palsy   . Lyme disease   . Depression   . Asthma   . Migraines   . Hiatal hernia   . Fibroids   . Neuropathy     hands and feet  . HSV-2 (herpes simplex virus 2) infection   . Esophageal reflux   . Scoliosis   . Osteoarthritis   . Vitamin d deficiency   . Fibromyalgia   . Fibrocystic breast disease   . Internal hemorrhoids   . Allergic rhinitis   . Cat-scratch disease   . History of babesiosis   . Hypertension     hx of several years ago   . Arrhythmia   . Neuropathy   . Hypoglycemia   . Blood dyscrasia     hx of nosebleeds 2005   . Chronic kidney disease     hx of uti    Past Surgical History  Procedure Date  . Myomectomy   . Tonsillectomy   . Uterine cyst     removed 5 times  . Cystectomy     hand  . Parathyroidectomy 01/28/2011    Procedure: PARATHYROIDECTOMY;  Surgeon: Velora Heckler, MD;  Location: [facility];  Service: General;  Laterality: N/A;   History   Social History  . Marital Status: Married    Spouse Name: N/A    Number of Children: N/A  . Years of Education: N/A   Social History Main Topics  . Smoking status: Never Smoker   . Smokeless tobacco: Never Used  . Alcohol Use: Yes     occasional wine   . Drug Use: No  . Sexually Active:    Other Topics Concern  . None   Social History Narrative  . None   Family History  Problem Relation Age of Onset  . Mitral valve prolapse Father   . Hypertension Father   . Dementia Father    . Hypothyroidism Mother   . Colon polyps Brother   . Aneurysm Brother   . Arthritis Brother   . Lupus Sister   . Rheum arthritis Sister   . Osteoarthritis Sister   . Cancer Maternal Aunt     breast   Allergies  Allergen Reactions  . Eggs Or Egg-Derived Products Other (See Comments)    Doesn't prefer to eat  . Iodine Hives  . Iohexol      Code: HIVES, Desc: pt. states had hives post contrast injection for c.t. scan.   . Milk-Related Compounds Other (See Comments)    Bad for digestion  . Flagyl (Metronidazole Hcl) Rash  . Tinidazole Rash   Prior to Admission medications   Medication Sig Start Date End Date Taking? Authorizing Provider  beclomethasone (QVAR) 80 MCG/ACT inhaler Inhale 1 puff into the lungs daily as needed. For allergies   Yes Historical Provider, MD  clonazePAM (KLONOPIN) 0.5 MG tablet Take 0.5 mg by mouth at bedtime as needed. For  sleep   Yes Historical Provider, MD  cyanocobalamin (,VITAMIN B-12,) 1000 MCG/ML injection Inject 1,000 mcg into the muscle daily.    Yes Historical Provider, MD  cyclobenzaprine (FLEXERIL) 10 MG tablet Take 10 mg by mouth 3 (three) times daily as needed. For muscle spasms   Yes Historical Provider, MD  DULoxetine (CYMBALTA) 60 MG capsule Take 60 mg by mouth every morning.    Yes Historical Provider, MD  fluconazole (DIFLUCAN) 200 MG tablet Take 200 mg by mouth 2 (two) times a week. Daily on tuesdays and thursdays   Yes Historical Provider, MD  gabapentin (NEURONTIN) 300 MG capsule Take 300 mg by mouth at bedtime.    Yes Historical Provider, MD  GLUTATHIONE PO Take 1 drop by mouth every 3 (three) days.    Yes Historical Provider, MD  Grapefruit Seed 60 % EXTR Take 1 drop by mouth 2 (two) times daily.    Yes Historical Provider, MD  hydrocortisone (CORTEF) 5 MG tablet Take 10 mg by mouth daily.  12/24/10  Yes Historical Provider, MD  hydroxychloroquine (PLAQUENIL) 200 MG tablet Take 200 mg by mouth 2 (two) times daily.    Yes Historical  Provider, MD  levalbuterol (XOPENEX HFA) 45 MCG/ACT inhaler Inhale 1 puff into the lungs every 4 (four) hours as needed. For shortness of breath   Yes Historical Provider, MD  levocetirizine (XYZAL) 5 MG tablet Take 5 mg by mouth daily.  12/21/10  Yes Historical Provider, MD  montelukast (SINGULAIR) 10 MG tablet Take 10 mg by mouth at bedtime.  12/21/10  Yes Historical Provider, MD  nystatin (MYCOSTATIN) 500000 UNITS TABS Take 2 tablets by mouth 2 (two) times daily.    Yes Historical Provider, MD  Probiotic Product (PRO-BIOTIC BLEND PO) Take 2 tablets by mouth daily. Takes 3 different kinds of probiotic   Yes Historical Provider, MD  progesterone (PROMETRIUM) 100 MG capsule Take 100 mg by mouth daily.    Yes Historical Provider, MD  thyroid (ARMOUR) 120 MG tablet Take 45 mg by mouth every morning.    Yes Historical Provider, MD  traMADol (ULTRAM-ER) 200 MG 24 hr tablet Take 200-400 mg by mouth daily as needed.  01/02/11  Yes Historical Provider, MD  traZODone (DESYREL) 100 MG tablet Take 100 mg by mouth at bedtime.    Yes Historical Provider, MD  VIVELLE-DOT 0.075 MG/24HR Place 1 patch onto the skin 2 (two) times a week. Changes on mondays and fridays 12/26/10  Yes Historical Provider, MD  atovaquone-proguanil (MALARONE) 250-100 MG TABS Take 1 tablet by mouth 2 (two) times daily.  08/01/11   Historical Provider, MD  clarithromycin (BIAXIN) 500 MG tablet Take 500 mg by mouth See admin instructions. BID, Monday thr. Friday 01/02/11   Historical Provider, MD  clindamycin (CLEOCIN) 300 MG capsule Take 600 mg by mouth 2 (two) times daily.  07/24/11   Historical Provider, MD  fluconazole (DIFLUCAN) 150 MG tablet Take 150 mg by mouth 2 (two) times a week. Daily on Saturday and sunday    Historical Provider, MD  MAGNESIUM CHLORIDE PO Take 1 tablet by mouth daily.     Historical Provider, MD  Multiple Vitamins-Minerals (MULTIVITAMINS THER. W/MINERALS) TABS Take 1 tablet by mouth daily.     Historical Provider, MD    OVER THE COUNTER MEDICATION Take 1 tablet by mouth 2 (two) times daily. serra peptase 500mg     Historical Provider, MD  PRESCRIPTION MEDICATION Take 3-4 drops by mouth daily. abart 0.04mg /drop    Historical Provider, MD  ROS: The patient denies fevers, chills, night sweats, unintentional weight loss, chest pain, palpitations, wheezing, dyspnea on exertion, dysuria, hematuria, melena, numbness, weakness, or tingling. + n/v, abd pain, nonbloody diarrhea  All other systems have been reviewed and were otherwise negative with the exception of those mentioned in the HPI and as above.    PHYSICAL EXAM: Filed Vitals:   11/21/11 1602  BP: 123/75  Pulse: 69  Temp: 98.1 F (36.7 C)  Resp: 18   Filed Vitals:   11/21/11 1602  Height: 5' 4.5" (1.638 m)  Weight: 164 lb (74.39 kg)   Body mass index is 27.72 kg/(m^2).  General: Alert, no acute distress HEENT:  Normocephalic, atraumatic, oropharynx patent. Mucosa is still moist. Cardiovascular:  Regular rate and rhythm, no rubs murmurs or gallops.  No Carotid bruits, radial pulse intact. No pedal edema.  Respiratory: Clear to auscultation bilaterally.  No wheezes, rales, or rhonchi.  No cyanosis, no use of accessory musculature GI: No organomegaly, abdomen is soft and non-tender, positive bowel sounds.  No guarding, nontender. No masses. Skin: No rashes. Neurologic: Facial musculature symmetric. Psychiatric: Patient is appropriate throughout our interaction. Lymphatic: No cervical lymphadenopathy Musculoskeletal: Gait intact.   LABS: Results for orders placed during the hospital encounter of 08/21/11  GLUCOSE, CAPILLARY      Component Value Range   Glucose-Capillary 69 (*) 70 - 99 mg/dL  GLUCOSE, CAPILLARY      Component Value Range   Glucose-Capillary 80  70 - 99 mg/dL     EKG/XRAY:   Primary read interpreted by Dr. Conley Rolls at [facility].   ASSESSMENT/PLAN: Encounter Diagnoses  Name Primary?  . Gastroenteritis Yes  . Nausea and  vomiting    Stool cx Zofran ODT and cipro F/u prn     LE, THAO PHUONG, DO 11/21/2011 4:32 PM

## 2011-11-25 LAB — STOOL CULTURE

## 2011-11-29 ENCOUNTER — Encounter: Payer: Self-pay | Admitting: Family Medicine

## 2011-12-18 DIAGNOSIS — M5137 Other intervertebral disc degeneration, lumbosacral region: Secondary | ICD-10-CM | POA: Diagnosis not present

## 2011-12-18 DIAGNOSIS — M5030 Other cervical disc degeneration, unspecified cervical region: Secondary | ICD-10-CM | POA: Diagnosis not present

## 2011-12-18 DIAGNOSIS — M2550 Pain in unspecified joint: Secondary | ICD-10-CM | POA: Diagnosis not present

## 2011-12-18 DIAGNOSIS — R5383 Other fatigue: Secondary | ICD-10-CM | POA: Diagnosis not present

## 2011-12-18 DIAGNOSIS — R5381 Other malaise: Secondary | ICD-10-CM | POA: Diagnosis not present

## 2011-12-18 DIAGNOSIS — Z79899 Other long term (current) drug therapy: Secondary | ICD-10-CM | POA: Diagnosis not present

## 2011-12-18 DIAGNOSIS — M76899 Other specified enthesopathies of unspecified lower limb, excluding foot: Secondary | ICD-10-CM | POA: Diagnosis not present

## 2011-12-18 DIAGNOSIS — IMO0001 Reserved for inherently not codable concepts without codable children: Secondary | ICD-10-CM | POA: Diagnosis not present

## 2012-01-09 ENCOUNTER — Other Ambulatory Visit (HOSPITAL_COMMUNITY): Payer: Self-pay | Admitting: *Deleted

## 2012-01-09 DIAGNOSIS — R110 Nausea: Secondary | ICD-10-CM

## 2012-01-13 ENCOUNTER — Ambulatory Visit (HOSPITAL_COMMUNITY)
Admission: RE | Admit: 2012-01-13 | Discharge: 2012-01-13 | Disposition: A | Discharge status: Home / Self Care | Payer: 59 | Source: Ambulatory Visit | Attending: Internal Medicine | Admitting: Internal Medicine

## 2012-01-13 DIAGNOSIS — R110 Nausea: Secondary | ICD-10-CM | POA: Diagnosis not present

## 2012-01-15 ENCOUNTER — Encounter: Payer: Self-pay | Admitting: Cardiology

## 2012-02-12 DIAGNOSIS — E039 Hypothyroidism, unspecified: Secondary | ICD-10-CM | POA: Diagnosis not present

## 2012-02-12 DIAGNOSIS — R110 Nausea: Secondary | ICD-10-CM | POA: Diagnosis not present

## 2012-02-12 DIAGNOSIS — E7800 Pure hypercholesterolemia, unspecified: Secondary | ICD-10-CM | POA: Diagnosis not present

## 2012-04-16 DIAGNOSIS — Z0189 Encounter for other specified special examinations: Secondary | ICD-10-CM | POA: Diagnosis not present

## 2012-05-26 ENCOUNTER — Ambulatory Visit: Payer: 59 | Admitting: Physician Assistant

## 2012-05-26 VITALS — BP 116/76 | HR 78 | Temp 98.1°F | Resp 12 | Ht 64.0 in | Wt 176.0 lb

## 2012-05-26 DIAGNOSIS — A6920 Lyme disease, unspecified: Secondary | ICD-10-CM

## 2012-05-26 DIAGNOSIS — I499 Cardiac arrhythmia, unspecified: Secondary | ICD-10-CM

## 2012-05-26 NOTE — Progress Notes (Signed)
I agree with the documentation by Ms. Hall.  I advised that without evaluation by a provider we cannot provide her with the service requested.  Diagnostic studies at our office are performed as part of appropriate evaluation and include interpretation.  The EKG was authorized prior to provider evaluation in order to expedite her visit, and reveals no evidence of dysrrhythmia or ischemia. The order form should be returned to the patient as soon as possible.  Should she choose to return for evaluation, we will gladly discuss the results with her and provide a copy to her and the clinic of her choice. Discussed with Nilda Simmer, MD.

## 2012-05-26 NOTE — Progress Notes (Signed)
Patient brought in an order for EKG to be performed (ordered by [facility] in [place], [place]). Showed Heather Elkhart, PA-C, the order and received approval to perform EKG (around 12 noon). At 1:40 P.M., patient was highly upset due to long wait time. She did not want to stay any longer. I sent her to check out to make sure we did not need any additional information. She then asked for [name] to provide a copy of her EKG performed in the office, and I Watt Climes) told her we could not give her a copy without her seeing a provider first. She stated, "So my time here has been a waste." I explained to her, for [name] to give her a copy we need her to see a provider so he/she may review the results with her and document on her hardcopy EKG with an interpretation. She did not like my response and said, "I'm done, I'm leaving." She did not take her order form with her when she left, it is still with her chart. I proceeded to discuss the situation with Porfirio Oar, PA-C and she took it from there.

## 2012-06-02 ENCOUNTER — Ambulatory Visit (INDEPENDENT_AMBULATORY_CARE_PROVIDER_SITE_OTHER): Payer: 59 | Admitting: Family Medicine

## 2012-06-02 VITALS — BP 106/80 | HR 75 | Temp 97.5°F | Resp 18 | Ht 64.5 in | Wt 176.0 lb

## 2012-06-02 DIAGNOSIS — Z8619 Personal history of other infectious and parasitic diseases: Secondary | ICD-10-CM

## 2012-06-02 NOTE — Patient Instructions (Signed)
Reports will be faxed to your Dr. at this time. Return if further issues.
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
## 2012-06-16 DIAGNOSIS — Z79899 Other long term (current) drug therapy: Secondary | ICD-10-CM | POA: Diagnosis not present

## 2012-06-16 DIAGNOSIS — A6920 Lyme disease, unspecified: Secondary | ICD-10-CM | POA: Diagnosis not present

## 2012-06-16 DIAGNOSIS — T5694XA Toxic effect of unspecified metal, undetermined, initial encounter: Secondary | ICD-10-CM | POA: Diagnosis not present

## 2012-06-18 ENCOUNTER — Other Ambulatory Visit: Payer: Self-pay | Admitting: Obstetrics and Gynecology

## 2012-06-18 DIAGNOSIS — N859 Noninflammatory disorder of uterus, unspecified: Secondary | ICD-10-CM | POA: Diagnosis not present

## 2012-06-25 DIAGNOSIS — IMO0001 Reserved for inherently not codable concepts without codable children: Secondary | ICD-10-CM | POA: Diagnosis not present

## 2012-06-25 DIAGNOSIS — M76899 Other specified enthesopathies of unspecified lower limb, excluding foot: Secondary | ICD-10-CM | POA: Diagnosis not present

## 2012-06-25 DIAGNOSIS — M5030 Other cervical disc degeneration, unspecified cervical region: Secondary | ICD-10-CM | POA: Diagnosis not present

## 2012-06-25 DIAGNOSIS — M5137 Other intervertebral disc degeneration, lumbosacral region: Secondary | ICD-10-CM | POA: Diagnosis not present

## 2012-08-04 ENCOUNTER — Other Ambulatory Visit: Payer: Self-pay

## 2012-08-04 DIAGNOSIS — Z1231 Encounter for screening mammogram for malignant neoplasm of breast: Secondary | ICD-10-CM

## 2012-08-14 DIAGNOSIS — Z79899 Other long term (current) drug therapy: Secondary | ICD-10-CM | POA: Diagnosis not present

## 2012-08-14 DIAGNOSIS — A6920 Lyme disease, unspecified: Secondary | ICD-10-CM | POA: Diagnosis not present

## 2012-08-14 DIAGNOSIS — T5694XA Toxic effect of unspecified metal, undetermined, initial encounter: Secondary | ICD-10-CM | POA: Diagnosis not present

## 2012-09-01 ENCOUNTER — Ambulatory Visit: Payer: 59

## 2012-09-01 ENCOUNTER — Ambulatory Visit
Admission: RE | Admit: 2012-09-01 | Discharge: 2012-09-01 | Disposition: A | Discharge status: Home / Self Care | Payer: 59 | Source: Ambulatory Visit

## 2012-09-01 DIAGNOSIS — Z1231 Encounter for screening mammogram for malignant neoplasm of breast: Secondary | ICD-10-CM

## 2012-09-03 ENCOUNTER — Other Ambulatory Visit: Payer: Self-pay | Admitting: Obstetrics and Gynecology

## 2012-09-03 DIAGNOSIS — R928 Other abnormal and inconclusive findings on diagnostic imaging of breast: Secondary | ICD-10-CM

## 2012-09-07 ENCOUNTER — Ambulatory Visit (INDEPENDENT_AMBULATORY_CARE_PROVIDER_SITE_OTHER): Payer: 59 | Admitting: Emergency Medicine

## 2012-09-07 VITALS — BP 112/78 | HR 79 | Temp 97.6°F | Resp 18 | Ht 66.5 in | Wt 168.0 lb

## 2012-09-07 DIAGNOSIS — I776 Arteritis, unspecified: Secondary | ICD-10-CM

## 2012-09-07 DIAGNOSIS — R21 Rash and other nonspecific skin eruption: Secondary | ICD-10-CM | POA: Diagnosis not present

## 2012-09-07 LAB — POCT URINALYSIS DIPSTICK
Bilirubin, UA: NEGATIVE
Blood, UA: NEGATIVE
Glucose, UA: NEGATIVE
Ketones, UA: NEGATIVE
Leukocytes, UA: NEGATIVE
Nitrite, UA: NEGATIVE
Protein, UA: NEGATIVE
Spec Grav, UA: >=1.03
Urobilinogen, UA: 0.2
pH, UA: 5.5

## 2012-09-07 LAB — POCT CBC
Granulocyte percent: 69.7 %G (ref 37–80)
HCT, POC: 44.4 % (ref 37.7–47.9)
Hemoglobin: 14.3 g/dL (ref 12.2–16.2)
Lymph, poc: 1.1 (ref 0.6–3.4)
MCH, POC: 30.7 pg (ref 27–31.2)
MCHC: 32.2 g/dL (ref 31.8–35.4)
MCV: 95.3 fL (ref 80–97)
MID (cbc): 0.3 (ref 0–0.9)
MPV: 11.2 fL (ref 0–99.8)
POC Granulocyte: 3.2 (ref 2–6.9)
POC LYMPH PERCENT: 23.1 %L (ref 10–50)
POC MID %: 7.2 %M (ref 0–12)
Platelet Count, POC: 167 10*3/uL (ref 142–424)
RBC: 4.66 M/uL (ref 4.04–5.48)
RDW, POC: 13.3 %
WBC: 4.6 10*3/uL (ref 4.6–10.2)

## 2012-09-07 LAB — POCT SEDIMENTATION RATE: POCT SED RATE: 27 mm/hr — AB (ref 0–22)

## 2012-09-07 NOTE — Progress Notes (Signed)
Subjective:    Patient ID: [name], female    DOB: 1960-03-09, 52 y.o.   MRN: [id number]  HPI 51 year old female presents with 3 day history of rash on right heel and bilateral fingers.  States symptoms appeared in the morning after she had been outside in some brush the night before.  She is concerned that it is either poison ivy or bed bug bites.  Admits it does seem to be spreading and has been slightly pruritic. Does admit the pruritis seems to be subsiding.  Hx of Lyme disease treated by a specialist in [place]. Went to see him last week - no changes to her medications. She is currently on Plaquenil, Nystatin, and Minocycline.  Has been on these for "years."  Does have a Dermatologist here in [place] as well as a PCP.  Admits to history of chronic fatigue syndrome as well as seasonal affective disorder. She remains on disability due to back pain and fatigue.   Denies fever, chills, nausea, vomiting, body aches, headache, or abdominal pain.       Review of Systems  Gastrointestinal: Negative for nausea, vomiting and abdominal pain.  Musculoskeletal: Negative for myalgias and arthralgias.  Skin: Positive for rash.       Objective:   Physical Exam  Constitutional: She is oriented to person, place, and time. She appears well-developed and well-nourished.  HENT:  Head: Normocephalic and atraumatic.  Right Ear: External ear normal.  Left Ear: External ear normal.  Eyes: Conjunctivae are normal.  Neck: Normal range of motion.  Cardiovascular: Normal rate.   Pulmonary/Chest: Effort normal.  Neurological: She is alert and oriented to person, place, and time.  Skin:     Bilateral fingers have multiple petechial dots on lateral surfaces  Psychiatric: She has a normal mood and affect. Her behavior is normal. Judgment and thought content normal.     Results for orders placed in visit on 09/07/12  POCT CBC      Result Value Range   WBC 4.6  4.6 - 10.2 K/uL   Lymph, poc  1.1  0.6 - 3.4   POC LYMPH PERCENT 23.1  10 - 50 %L   MID (cbc) 0.3  0 - 0.9   POC MID % 7.2  0 - 12 %M   POC Granulocyte 3.2  2 - 6.9   Granulocyte percent 69.7  37 - 80 %G   RBC 4.66  4.04 - 5.48 M/uL   Hemoglobin 14.3  12.2 - 16.2 g/dL   HCT, POC 78.2  95.6 - 47.9 %   MCV 95.3  80 - 97 fL   MCH, POC 30.7  27 - 31.2 pg   MCHC 32.2  31.8 - 35.4 g/dL   RDW, POC 21.3     Platelet Count, POC 167  142 - 424 K/uL   MPV 11.2  0 - 99.8 fL  POCT URINALYSIS DIPSTICK      Result Value Range   Color, UA yellow     Clarity, UA clear     Glucose, UA neg     Bilirubin, UA neg     Ketones, UA neg     Spec Grav, UA >=1.030     Blood, UA neg     pH, UA 5.5     Protein, UA neg     Urobilinogen, UA 0.2     Nitrite, UA neg     Leukocytes, UA Negative  Assessment & Plan:  Rash and nonspecific skin eruption - Plan: POCT CBC, POCT SEDIMENTATION RATE, Ambulatory referral to Dermatology  Vasculitis - Plan: POCT CBC, POCT SEDIMENTATION RATE, Ambulatory referral to Dermatology, POCT urinalysis dipstick  Labs reassuring Vasculitis - ? Etiology (drug reaction vs Lyme) Continue Vistaril Urgent referral to Dermatology (Dr. Donzetta Starch) for evaluation and treatment with possible biopsy Follow up here if symptoms worsen or fail to improve.

## 2012-09-16 ENCOUNTER — Ambulatory Visit
Admission: RE | Admit: 2012-09-16 | Discharge: 2012-09-16 | Disposition: A | Discharge status: Home / Self Care | Payer: 59 | Source: Ambulatory Visit | Attending: Obstetrics and Gynecology | Admitting: Obstetrics and Gynecology

## 2012-09-16 DIAGNOSIS — N6489 Other specified disorders of breast: Secondary | ICD-10-CM | POA: Diagnosis not present

## 2012-09-16 DIAGNOSIS — R928 Other abnormal and inconclusive findings on diagnostic imaging of breast: Secondary | ICD-10-CM

## 2012-10-21 DIAGNOSIS — Z1211 Encounter for screening for malignant neoplasm of colon: Secondary | ICD-10-CM | POA: Diagnosis not present

## 2012-10-21 DIAGNOSIS — Z8371 Family history of colonic polyps: Secondary | ICD-10-CM | POA: Diagnosis not present

## 2012-11-24 ENCOUNTER — Encounter (HOSPITAL_BASED_OUTPATIENT_CLINIC_OR_DEPARTMENT_OTHER): Payer: Self-pay | Admitting: *Deleted

## 2012-11-25 ENCOUNTER — Encounter (HOSPITAL_BASED_OUTPATIENT_CLINIC_OR_DEPARTMENT_OTHER): Payer: Self-pay | Admitting: *Deleted

## 2012-11-26 DIAGNOSIS — N926 Irregular menstruation, unspecified: Secondary | ICD-10-CM | POA: Diagnosis not present

## 2012-11-26 NOTE — H&P (Signed)
  Patient name   [name], [name] DICTATION#   [id number] CSN# [id number]  Juluis Mire, MD 11/26/2012 7:02 PM

## 2012-11-27 ENCOUNTER — Encounter (HOSPITAL_BASED_OUTPATIENT_CLINIC_OR_DEPARTMENT_OTHER): Payer: Self-pay | Admitting: *Deleted

## 2012-11-27 NOTE — H&P (Signed)
[name], BUCKS NO.:  [id number]  MEDICAL RECORD NO.:  [id number]  LOCATION:                                 FACILITY:  PHYSICIAN:  Juluis Mire, M.D.   DATE OF BIRTH:  13-Nov-1960  DATE OF ADMISSION:  12/02/2012 DATE OF DISCHARGE:                             HISTORY & PHYSICAL   DATE OF SURGERY:  December 02, 2012, at the [facility] in [place].  HISTORY OF PRESENT ILLNESS:  The patient is a 52 year old, gravida 1, para 0 female, who presents for hysteroscopy, D and E.  She is on exogenous hormone replacement therapy using a bivalve patch and Prometrium.  She had continuous abnormal bleeding despite vary in the doses.  She had a previous saline infusion ultrasound that revealed a small subserosal fibroid.  Despite adjusting on hormone therapy, she continues to have this bleeding pattern, so she now presents for hysteroscopic evaluation.  ALLERGIES:  In terms of allergies, she is allergic to [facility], FLAGYL, and looks like IODINE.  MEDICATIONS:  She is on Synthroid replacement, minocycline 100 mg 2 times a day, nystatin as needed, Cymbalta, tramadol, and trazodone.  PAST MEDICAL HISTORY:  She has a history of Lyme disease, for which, she is under active management.  She also has history of arthritis, urinary tract infections, hiatal hernia, history of cardiac arrhythmias, migraine headaches.  PAST SURGICAL HISTORY:  She has had a tonsillectomy.  She has had a partial thyroidectomy.  She had her hernia that with fixed and removal of cyst through hysteroscopy.  SOCIAL HISTORY:  She has minimal alcohol use.  No tobacco use.  FAMILY HISTORY:  Noncontributory.  REVIEW OF SYSTEMS:  Noncontributory.  PHYSICAL EXAMINATION:  VITAL SIGNS:  The patient is afebrile with stable vital signs. HEENT:  The patient is normocephalic.  Pupils are equal, round, and reactive to light and accommodation.  Extraocular movements are  intact. Sclerae and conjunctivae are clear.  Oropharynx clear. NECK:  Without thyromegaly. BREASTS:  No discrete masses. LUNGS:  Clear. CARDIOVASCULAR SYSTEM:  Regular rate.  No murmurs or gallops.  No carotid or abdominal bruits. ABDOMEN:  Benign.  No mass, organomegaly, or tenderness. PELVIC:  Normal external genitalia.  Vaginal mucosa is clear.  Cervix unremarkable.  Uterus; normal size, shape, and contour.  Adnexa; free of masses or tenderness. EXTREMITIES:  Trace edema. NEUROLOGIC:  Grossly within normal limits.  IMPRESSION: 1. Continued abnormal uterine bleeding, on hormone replacement therapy     despite adjustment, rule out endometrial pathology. 2. Subserosal fibroid. 3. Lyme disease with associated sequelae.  PLAN:  The patient to undergo hysteroscopy D and C.  The risks of surgery have been discussed including the risk of infection.  The risk of vascular injury could lead to hemorrhage, requiring transfusion with the risk of AIDS or hepatitis; excessive bleeding, could require hysterectomy; risk of perforation of injury to adjacent organs, requiring exploratory surgery; risk of deep venous thrombosis and pulmonary embolus.  The patient expressed understanding of potential risks and complications.     Juluis Mire, M.D.     JSM/MEDQ  D:  11/26/2012  T:  11/27/2012  Job:  [id number]

## 2012-12-01 ENCOUNTER — Encounter (HOSPITAL_BASED_OUTPATIENT_CLINIC_OR_DEPARTMENT_OTHER): Payer: Self-pay | Admitting: *Deleted

## 2012-12-01 NOTE — Progress Notes (Signed)
NPO AFTER MN. ARRIVES AT 0700. NEEDS CBC AND SERUM PREG. WILL TAKE ARMOUR THYROID AM OF SURG W/ SIPS OF WATER.

## 2012-12-01 NOTE — Anesthesia Preprocedure Evaluation (Signed)
Anesthesia Evaluation  Patient identified by MRN, date of birth, ID band Patient awake    Reviewed: Allergy & Precautions, H&P , NPO status , Patient's Chart, lab work & pertinent test results  Airway Mallampati: II TM Distance: >3 FB Neck ROM: Full    Dental no notable dental hx.    Pulmonary neg pulmonary ROS, asthma ,  breath sounds clear to auscultation  Pulmonary exam normal       Cardiovascular hypertension, Pt. on medications Rhythm:Regular Rate:Normal     Neuro/Psych  Headaches, PSYCHIATRIC DISORDERS  Neuromuscular disease negative neurological ROS  negative psych ROS   GI/Hepatic Neg liver ROS, hiatal hernia, GERD-  Medicated,  Endo/Other  negative endocrine ROS  Renal/GU negative Renal ROS     Musculoskeletal  (+) Fibromyalgia -  Abdominal   Peds  Hematology negative hematology ROS (+)   Anesthesia Other Findings   Reproductive/Obstetrics negative OB ROS                           Anesthesia Physical  Anesthesia Plan  ASA: III  Anesthesia Plan: MAC   Post-op Pain Management:    Induction: Intravenous  Airway Management Planned: Mask  Additional Equipment:   Intra-op Plan:   Post-operative Plan: Extubation in OR  Informed Consent: I have reviewed the patients History and Physical, chart, labs and discussed the procedure including the risks, benefits and alternatives for the proposed anesthesia with the patient or authorized representative who has indicated his/her understanding and acceptance.   Dental advisory given  Plan Discussed with: CRNA  Anesthesia Plan Comments:         Anesthesia Quick Evaluation

## 2012-12-02 ENCOUNTER — Ambulatory Visit (HOSPITAL_BASED_OUTPATIENT_CLINIC_OR_DEPARTMENT_OTHER)
Admission: RE | Admit: 2012-12-02 | Discharge: 2012-12-02 | Disposition: A | Discharge status: Home / Self Care | Payer: 59 | Source: Ambulatory Visit | Attending: Obstetrics and Gynecology | Admitting: Obstetrics and Gynecology

## 2012-12-02 ENCOUNTER — Encounter (HOSPITAL_BASED_OUTPATIENT_CLINIC_OR_DEPARTMENT_OTHER)
Admission: RE | Disposition: A | Discharge status: Home / Self Care | Payer: Self-pay | Source: Ambulatory Visit | Attending: Obstetrics and Gynecology

## 2012-12-02 ENCOUNTER — Encounter (HOSPITAL_BASED_OUTPATIENT_CLINIC_OR_DEPARTMENT_OTHER): Payer: Self-pay

## 2012-12-02 ENCOUNTER — Ambulatory Visit (HOSPITAL_BASED_OUTPATIENT_CLINIC_OR_DEPARTMENT_OTHER): Payer: 59 | Admitting: Anesthesiology

## 2012-12-02 ENCOUNTER — Encounter (HOSPITAL_BASED_OUTPATIENT_CLINIC_OR_DEPARTMENT_OTHER): Payer: 59 | Admitting: Anesthesiology

## 2012-12-02 DIAGNOSIS — N926 Irregular menstruation, unspecified: Secondary | ICD-10-CM | POA: Insufficient documentation

## 2012-12-02 DIAGNOSIS — Z79890 Hormone replacement therapy (postmenopausal): Secondary | ICD-10-CM | POA: Diagnosis not present

## 2012-12-02 DIAGNOSIS — A6920 Lyme disease, unspecified: Secondary | ICD-10-CM | POA: Insufficient documentation

## 2012-12-02 DIAGNOSIS — D250 Submucous leiomyoma of uterus: Secondary | ICD-10-CM | POA: Diagnosis not present

## 2012-12-02 DIAGNOSIS — D259 Leiomyoma of uterus, unspecified: Secondary | ICD-10-CM | POA: Diagnosis not present

## 2012-12-02 DIAGNOSIS — N950 Postmenopausal bleeding: Secondary | ICD-10-CM | POA: Diagnosis present

## 2012-12-02 DIAGNOSIS — N949 Unspecified condition associated with female genital organs and menstrual cycle: Secondary | ICD-10-CM | POA: Diagnosis not present

## 2012-12-02 DIAGNOSIS — IMO0001 Reserved for inherently not codable concepts without codable children: Secondary | ICD-10-CM | POA: Diagnosis not present

## 2012-12-02 DIAGNOSIS — N939 Abnormal uterine and vaginal bleeding, unspecified: Secondary | ICD-10-CM | POA: Insufficient documentation

## 2012-12-02 DIAGNOSIS — N938 Other specified abnormal uterine and vaginal bleeding: Secondary | ICD-10-CM | POA: Diagnosis not present

## 2012-12-02 HISTORY — DX: Gastro-esophageal reflux disease without esophagitis: K21.9

## 2012-12-02 HISTORY — DX: Abnormal uterine and vaginal bleeding, unspecified: N93.9

## 2012-12-02 HISTORY — DX: Dorsalgia, unspecified: M54.9

## 2012-12-02 HISTORY — DX: Personal history of traumatic brain injury: Z87.820

## 2012-12-02 HISTORY — DX: Presence of spectacles and contact lenses: Z97.3

## 2012-12-02 HISTORY — DX: Personal history of other benign neoplasm: Z86.018

## 2012-12-02 HISTORY — DX: Personal history of other endocrine, nutritional and metabolic disease: Z86.39

## 2012-12-02 HISTORY — DX: Deficiency of other specified B group vitamins: E53.8

## 2012-12-02 HISTORY — DX: Personal history of other specified conditions: Z87.898

## 2012-12-02 HISTORY — DX: Other chest pain: R07.89

## 2012-12-02 HISTORY — PX: DILATATION & CURRETTAGE/HYSTEROSCOPY WITH RESECTOCOPE: SHX5572

## 2012-12-02 HISTORY — DX: Other skin changes: R23.8

## 2012-12-02 HISTORY — DX: Personal history of other diseases of the digestive system: Z87.19

## 2012-12-02 HISTORY — DX: Personal history of other diseases of the nervous system and sense organs: Z86.69

## 2012-12-02 HISTORY — DX: Other chronic pain: G89.29

## 2012-12-02 HISTORY — DX: Anxiety disorder, unspecified: F41.9

## 2012-12-02 HISTORY — DX: Spontaneous ecchymoses: R23.3

## 2012-12-02 LAB — CBC
Hemoglobin: 13.7 g/dL (ref 12.0–15.0)
MCH: 30.6 pg (ref 26.0–34.0)
MCV: 87.5 fL (ref 78.0–100.0)
Platelets: 162 10*3/uL (ref 150–400)
RBC: 4.47 MIL/uL (ref 3.87–5.11)
RDW: 12.6 % (ref 11.5–15.5)
WBC: 4.4 10*3/uL (ref 4.0–10.5)

## 2012-12-02 LAB — HCG, QUANTITATIVE, PREGNANCY: hCG, Beta Chain, Quant, S: 2 m[IU]/mL (ref <5)

## 2012-12-02 SURGERY — DILATATION & CURETTAGE/HYSTEROSCOPY WITH RESECTOCOPE
Anesthesia: General | Site: Vagina | Wound class: Clean Contaminated

## 2012-12-02 MED ORDER — OXYCODONE-ACETAMINOPHEN 5-325 MG PO TABS
1.0000 | ORAL_TABLET | ORAL | Status: DC | PRN
  Administered 2012-12-02: 1 via ORAL
  Filled 2012-12-02: qty 1

## 2012-12-02 MED ORDER — OXYCODONE HCL 5 MG/5ML PO SOLN
5.0000 mg | Freq: Once | ORAL | Status: DC | PRN
  Filled 2012-12-02: qty 5

## 2012-12-02 MED ORDER — LACTATED RINGERS IV SOLN
INTRAVENOUS | Status: DC
  Administered 2012-12-02 (×2): via INTRAVENOUS
  Filled 2012-12-02: qty 1000

## 2012-12-02 MED ORDER — PROMETHAZINE HCL 25 MG/ML IJ SOLN
6.2500 mg | INTRAMUSCULAR | Status: DC | PRN
  Filled 2012-12-02: qty 1

## 2012-12-02 MED ORDER — CEFAZOLIN SODIUM-DEXTROSE 2-3 GM-% IV SOLR
2.0000 g | INTRAVENOUS | Status: AC
  Administered 2012-12-02: 2 g via INTRAVENOUS
  Filled 2012-12-02: qty 50

## 2012-12-02 MED ORDER — GLYCINE 1.5 % IR SOLN
Status: DC | PRN
  Administered 2012-12-02: 3000 mL

## 2012-12-02 MED ORDER — LIDOCAINE-EPINEPHRINE 1 %-1:100000 IJ SOLN
INTRAMUSCULAR | Status: DC | PRN
  Administered 2012-12-02: 10 mL

## 2012-12-02 MED ORDER — DEXAMETHASONE SODIUM PHOSPHATE 4 MG/ML IJ SOLN
INTRAMUSCULAR | Status: DC | PRN
  Administered 2012-12-02: 8 mg via INTRAVENOUS

## 2012-12-02 MED ORDER — OXYCODONE HCL 5 MG PO TABS
5.0000 mg | ORAL_TABLET | Freq: Once | ORAL | Status: DC | PRN
  Filled 2012-12-02: qty 1

## 2012-12-02 MED ORDER — HYDROMORPHONE HCL PF 1 MG/ML IJ SOLN
0.2500 mg | INTRAMUSCULAR | Status: DC | PRN
  Filled 2012-12-02: qty 1

## 2012-12-02 MED ORDER — MIDAZOLAM HCL 5 MG/5ML IJ SOLN
INTRAMUSCULAR | Status: DC | PRN
  Administered 2012-12-02: 1.5 mg via INTRAVENOUS

## 2012-12-02 MED ORDER — MEPERIDINE HCL 25 MG/ML IJ SOLN
6.2500 mg | INTRAMUSCULAR | Status: DC | PRN
  Filled 2012-12-02: qty 1

## 2012-12-02 MED ORDER — FENTANYL CITRATE 0.05 MG/ML IJ SOLN
INTRAMUSCULAR | Status: DC | PRN
Start: 2012-12-02 — End: 2012-12-02
  Administered 2012-12-02: 100 ug via INTRAVENOUS

## 2012-12-02 MED ORDER — PROPOFOL 10 MG/ML IV BOLUS
INTRAVENOUS | Status: DC | PRN
  Administered 2012-12-02: 180 mg via INTRAVENOUS

## 2012-12-02 MED ORDER — LIDOCAINE HCL (CARDIAC) 20 MG/ML IV SOLN
INTRAVENOUS | Status: DC | PRN
  Administered 2012-12-02: 50 mg via INTRAVENOUS

## 2012-12-02 MED ORDER — OXYCODONE-ACETAMINOPHEN 7.5-325 MG PO TABS: 1.0000 | ORAL_TABLET | ORAL | Status: DC | PRN

## 2012-12-02 MED ORDER — ONDANSETRON HCL 4 MG/2ML IJ SOLN
INTRAMUSCULAR | Status: DC | PRN
  Administered 2012-12-02: 4 mg via INTRAMUSCULAR

## 2012-12-02 MED ORDER — FENTANYL CITRATE 0.05 MG/ML IJ SOLN
25.0000 ug | INTRAMUSCULAR | Status: DC | PRN
  Administered 2012-12-02: 25 ug via INTRAVENOUS
  Filled 2012-12-02: qty 1

## 2012-12-02 SURGICAL SUPPLY — 32 items
CANISTER SUCTION 2500CC (MISCELLANEOUS) ×2 IMPLANT
CATH ROBINSON RED A/P 16FR (CATHETERS) IMPLANT
CLOTH BEACON ORANGE TIMEOUT ST (SAFETY) ×2 IMPLANT
CORD ACTIVE DISPOSABLE (ELECTRODE) ×1
CORD ELECTRO ACTIVE DISP (ELECTRODE) IMPLANT
COVER TABLE BACK 60X90 (DRAPES) ×2 IMPLANT
DRAPE CAMERA CLOSED 9X96 (DRAPES) ×2 IMPLANT
DRAPE LG THREE QUARTER DISP (DRAPES) ×2 IMPLANT
DRESSING TELFA 8X3 (GAUZE/BANDAGES/DRESSINGS) ×2 IMPLANT
ELECT LOOP GYNE PRO 24FR (CUTTING LOOP) ×2
ELECT REM PT RETURN 9FT ADLT (ELECTROSURGICAL) ×2
ELECT VAPORTRODE GRVD BAR (ELECTRODE) ×1 IMPLANT
ELECTRODE LOOP GYNE PRO 24FR (CUTTING LOOP) ×1 IMPLANT
ELECTRODE REM PT RTRN 9FT ADLT (ELECTROSURGICAL) ×1 IMPLANT
GLOVE BIO SURGEON STRL SZ 6 (GLOVE) ×1 IMPLANT
GLOVE BIO SURGEON STRL SZ 6.5 (GLOVE) ×1 IMPLANT
GLOVE BIO SURGEON STRL SZ7 (GLOVE) ×4 IMPLANT
GLOVE BIO SURGEON STRL SZ7.5 (GLOVE) ×1 IMPLANT
GLOVE BIOGEL PI IND STRL 7.5 (GLOVE) IMPLANT
GLOVE BIOGEL PI INDICATOR 7.5 (GLOVE) ×2
GOWN PREVENTION PLUS LG XLONG (DISPOSABLE) ×4 IMPLANT
GOWN STRL NON-REIN LRG LVL3 (GOWN DISPOSABLE) ×1 IMPLANT
LEGGING LITHOTOMY PAIR STRL (DRAPES) ×2 IMPLANT
NDL SPNL 22GX3.5 QUINCKE BK (NEEDLE) ×1 IMPLANT
NEEDLE SPNL 22GX3.5 QUINCKE BK (NEEDLE) ×2 IMPLANT
PACK BASIN DAY SURGERY FS (CUSTOM PROCEDURE TRAY) ×2 IMPLANT
PAD OB MATERNITY 4.3X12.25 (PERSONAL CARE ITEMS) ×2 IMPLANT
PAD PREP 24X48 CUFFED NSTRL (MISCELLANEOUS) ×2 IMPLANT
SYR CONTROL 10ML LL (SYRINGE) ×2 IMPLANT
TOWEL OR 17X24 6PK STRL BLUE (TOWEL DISPOSABLE) ×3 IMPLANT
TUBING HYDROFLEX HYSTEROSCOPY (TUBING) ×2 IMPLANT
WATER STERILE IRR 500ML POUR (IV SOLUTION) ×2 IMPLANT

## 2012-12-02 NOTE — Transfer of Care (Signed)
Immediate Anesthesia Transfer of Care Note  Patient: [name]  Procedure(s) Performed: Procedure(s): DILATATION & CURETTAGE/HYSTEROSCOPY WITH RESECTOCOPE (N/A)  Patient Location: PACU  Anesthesia Type:General  Level of Consciousness: awake and alert   Airway & Oxygen Therapy: Patient Spontanous Breathing and Patient connected to face mask oxygen  Post-op Assessment: Report given to PACU RN and Post -op Vital signs reviewed and stable  Post vital signs: Reviewed and stable  Complications: No apparent anesthesia complications

## 2012-12-02 NOTE — Brief Op Note (Signed)
12/02/2012  9:11 AM  PATIENT:  [name]  52 y.o. female  PRE-OPERATIVE DIAGNOSIS:  AUB cpt [phone]  POST-OPERATIVE DIAGNOSIS:  AUB cpt [phone]  PROCEDURE:  Procedure(s): DILATATION & CURETTAGE/HYSTEROSCOPY WITH RESECTOCOPE (N/A)  SURGEON:  Surgeon(s) and Role:    * Juluis Mire, MD - Primary  PHYSICIAN ASSISTANT:   ASSISTANTS: none   ANESTHESIA:   general and paracervical block  EBL:     BLOOD ADMINISTERED:none  DRAINS: none   LOCAL MEDICATIONS USED:  XYLOCAINE   SPECIMEN:  Source of Specimen:  submucosal fibroid and endometrial currettings  DISPOSITION OF SPECIMEN:  PATHOLOGY  COUNTS:  YES  TOURNIQUET:  * No tourniquets in log *  DICTATION: .Other Dictation: Dictation Number [id number]  PLAN OF CARE: Discharge to home after PACU  PATIENT DISPOSITION:  PACU - hemodynamically stable.   Delay start of Pharmacological VTE agent (>24hrs) due to surgical blood loss or risk of bleeding: not applicable

## 2012-12-02 NOTE — Anesthesia Procedure Notes (Signed)
Procedure Name: LMA Insertion Performed by: Finch Costanzo, Sharon Pre-anesthesia Checklist: Patient identified, Emergency Drugs available, Suction available and Patient being monitored Patient Re-evaluated:Patient Re-evaluated prior to inductionOxygen Delivery Method: Circle System Utilized Preoxygenation: Pre-oxygenation with 100% oxygen Intubation Type: IV induction Ventilation: Mask ventilation without difficulty LMA: LMA inserted LMA Size: 4.0 Number of attempts: 1 Airway Equipment and Method: bite block Placement Confirmation: positive ETCO2 Tube secured with: Tape Dental Injury: Teeth and Oropharynx as per pre-operative assessment      

## 2012-12-02 NOTE — Anesthesia Postprocedure Evaluation (Signed)
Anesthesia Post Note  Patient: [name]  Procedure(s) Performed: Procedure(s) (LRB): DILATATION & CURETTAGE/HYSTEROSCOPY WITH RESECTOCOPE (N/A)  Anesthesia type: General  Patient location: PACU  Post pain: Pain level controlled  Post assessment: Post-op Vital signs reviewed  Last Vitals: BP 114/79  Pulse 79  Temp(Src) 36.3 C (Oral)  Resp 12  Ht 5\' 4"  (1.626 m)  Wt 176 lb 8 oz (80.06 kg)  BMI 30.28 kg/m2  SpO2 100%  LMP 01/22/2011  Post vital signs: Reviewed  Level of consciousness: sedated  Complications: No apparent anesthesia complications

## 2012-12-02 NOTE — Op Note (Signed)
Patient name [name], [name] HQIONGEXB#[id number] CSN#   [id number]  Juluis Mire, MD 12/02/2012 9:17 AM

## 2012-12-03 ENCOUNTER — Encounter (HOSPITAL_BASED_OUTPATIENT_CLINIC_OR_DEPARTMENT_OTHER): Payer: Self-pay | Admitting: Obstetrics and Gynecology

## 2012-12-03 NOTE — Op Note (Signed)
NAME:  [name], [name] NO.:  [id number]  MEDICAL RECORD NO.:  [id number]  LOCATION:                                 FACILITY:  PHYSICIAN:  Juluis Mire, M.D.   DATE OF BIRTH:  [birth year]  DATE OF PROCEDURE:  12/02/2012 DATE OF DISCHARGE:                              OPERATIVE REPORT   PREOPERATIVE DIAGNOSIS:  Abnormal uterine bleeding, on hormone replacement therapy, with submucosal fibroid.  POSTOPERATIVE DIAGNOSIS:  Abnormal uterine bleeding, on hormone replacement therapy, with submucosal fibroid.  OPERATIVE PROCEDURE:  Paracervical block, hysteroscopy with resection of submucosal fibroid, and endometrial curettings.  ANESTHESIA:  Both paracervical block and general anesthesia.  ESTIMATED BLOOD LOSS:  Minimal.  PACKS AND DRAINS:  None.  INTRAOPERATIVE BLOOD PLACED:  None.  COMPLICATIONS:  None.  INDICATIONS:  As previously dictated.  DESCRIPTION OF PROCEDURE:  The patient was taken to the OR and placed in supine position.  After satisfactory level of general anesthesia obtained, the patient was placed in the dorsal lithotomy position using the Allen stirrups.  Perineum and vagina were prepped out with Betadine. Bladder was emptied by in-and-out catheterization.  The patient was draped in sterile field.  A weighted speculum was placed in the vaginal vault.  The anterior lip of the cervix was grasped with single-tooth tenaculum.  Paracervical block of approximately 10 mL of 1% Xylocaine with epinephrine was instituted.  Uterus sounded to approximately 8 cm. Cervix serially dilated to a size 35 Pratt dilator.  Operative hysteroscope was introduced.  Intrauterine cavity was distended using glycine.  Visualization revealed a submucosal fibroid.  The rest of the endometrium was smooth and atrophic.  Both tubal ostium were noted. Using the resectoscope, we were able to resect the majority of the submucosal fibroid that was sent for pathological view.   Total deficit was 180 mL.  We then obtained endometrial curettings, this was also sent to Pathology.  During the procedure, there was no active bleeding. There were no signs of perforation or other complications.  Weighted speculum and single-tooth tenaculum were removed.  The patient taken out of dorsal lithotomy position.  Once alert, extubated.  She was transferred to the recovery room in good condition.  Sponge, instrument, and needle count was correct by the circulating nurse.     Juluis Mire, M.D.     JSM/MEDQ  D:  12/02/2012  T:  12/03/2012  Job:  [id number]

## 2012-12-16 DIAGNOSIS — E559 Vitamin D deficiency, unspecified: Secondary | ICD-10-CM | POA: Diagnosis not present

## 2012-12-16 DIAGNOSIS — A6920 Lyme disease, unspecified: Secondary | ICD-10-CM | POA: Diagnosis not present

## 2012-12-16 DIAGNOSIS — T5694XA Toxic effect of unspecified metal, undetermined, initial encounter: Secondary | ICD-10-CM | POA: Diagnosis not present

## 2012-12-16 DIAGNOSIS — E039 Hypothyroidism, unspecified: Secondary | ICD-10-CM | POA: Diagnosis not present

## 2012-12-16 DIAGNOSIS — IMO0001 Reserved for inherently not codable concepts without codable children: Secondary | ICD-10-CM | POA: Diagnosis not present

## 2012-12-16 DIAGNOSIS — Z79899 Other long term (current) drug therapy: Secondary | ICD-10-CM | POA: Diagnosis not present

## 2013-04-12 DIAGNOSIS — R7989 Other specified abnormal findings of blood chemistry: Secondary | ICD-10-CM | POA: Diagnosis not present

## 2013-04-12 DIAGNOSIS — Z79899 Other long term (current) drug therapy: Secondary | ICD-10-CM | POA: Diagnosis not present

## 2013-04-12 DIAGNOSIS — A6920 Lyme disease, unspecified: Secondary | ICD-10-CM | POA: Diagnosis not present

## 2013-05-20 DIAGNOSIS — A6920 Lyme disease, unspecified: Secondary | ICD-10-CM | POA: Diagnosis not present

## 2013-05-20 DIAGNOSIS — R6889 Other general symptoms and signs: Secondary | ICD-10-CM | POA: Diagnosis not present

## 2013-05-20 DIAGNOSIS — E039 Hypothyroidism, unspecified: Secondary | ICD-10-CM | POA: Diagnosis not present

## 2013-07-15 DIAGNOSIS — Z79899 Other long term (current) drug therapy: Secondary | ICD-10-CM | POA: Diagnosis not present

## 2013-07-15 DIAGNOSIS — Z0389 Encounter for observation for other suspected diseases and conditions ruled out: Secondary | ICD-10-CM | POA: Diagnosis not present

## 2013-07-29 DIAGNOSIS — R5383 Other fatigue: Secondary | ICD-10-CM | POA: Diagnosis not present

## 2013-07-29 DIAGNOSIS — R5381 Other malaise: Secondary | ICD-10-CM | POA: Diagnosis not present

## 2013-07-29 DIAGNOSIS — A6920 Lyme disease, unspecified: Secondary | ICD-10-CM | POA: Diagnosis not present

## 2013-07-29 DIAGNOSIS — IMO0001 Reserved for inherently not codable concepts without codable children: Secondary | ICD-10-CM | POA: Diagnosis not present

## 2013-07-29 DIAGNOSIS — Z09 Encounter for follow-up examination after completed treatment for conditions other than malignant neoplasm: Secondary | ICD-10-CM | POA: Diagnosis not present

## 2013-08-05 DIAGNOSIS — G9332 Myalgic encephalomyelitis/chronic fatigue syndrome: Secondary | ICD-10-CM | POA: Diagnosis not present

## 2013-08-05 DIAGNOSIS — E039 Hypothyroidism, unspecified: Secondary | ICD-10-CM | POA: Diagnosis not present

## 2013-08-05 DIAGNOSIS — A6920 Lyme disease, unspecified: Secondary | ICD-10-CM | POA: Diagnosis not present

## 2013-08-05 DIAGNOSIS — R5382 Chronic fatigue, unspecified: Secondary | ICD-10-CM | POA: Diagnosis not present

## 2013-08-12 DIAGNOSIS — R7989 Other specified abnormal findings of blood chemistry: Secondary | ICD-10-CM | POA: Diagnosis not present

## 2013-08-12 DIAGNOSIS — E039 Hypothyroidism, unspecified: Secondary | ICD-10-CM | POA: Diagnosis not present

## 2013-08-12 DIAGNOSIS — A6920 Lyme disease, unspecified: Secondary | ICD-10-CM | POA: Diagnosis not present

## 2013-08-12 DIAGNOSIS — E559 Vitamin D deficiency, unspecified: Secondary | ICD-10-CM | POA: Diagnosis not present

## 2013-08-12 DIAGNOSIS — Z79899 Other long term (current) drug therapy: Secondary | ICD-10-CM | POA: Diagnosis not present

## 2013-09-02 ENCOUNTER — Other Ambulatory Visit: Payer: Self-pay | Admitting: Obstetrics and Gynecology

## 2013-09-02 DIAGNOSIS — N6489 Other specified disorders of breast: Secondary | ICD-10-CM

## 2013-09-09 ENCOUNTER — Encounter (INDEPENDENT_AMBULATORY_CARE_PROVIDER_SITE_OTHER): Payer: Self-pay

## 2013-09-09 ENCOUNTER — Ambulatory Visit
Admission: RE | Admit: 2013-09-09 | Discharge: 2013-09-09 | Disposition: A | Discharge status: Home / Self Care | Payer: 59 | Source: Ambulatory Visit | Attending: Obstetrics and Gynecology | Admitting: Obstetrics and Gynecology

## 2013-09-09 DIAGNOSIS — N6489 Other specified disorders of breast: Secondary | ICD-10-CM

## 2013-11-09 ENCOUNTER — Other Ambulatory Visit: Payer: Self-pay | Admitting: Obstetrics and Gynecology

## 2013-11-09 DIAGNOSIS — Z01419 Encounter for gynecological examination (general) (routine) without abnormal findings: Secondary | ICD-10-CM | POA: Diagnosis not present

## 2013-11-10 LAB — CYTOLOGY - PAP

## 2013-12-02 DIAGNOSIS — Z131 Encounter for screening for diabetes mellitus: Secondary | ICD-10-CM | POA: Diagnosis not present

## 2013-12-02 DIAGNOSIS — Z13820 Encounter for screening for osteoporosis: Secondary | ICD-10-CM | POA: Diagnosis not present

## 2013-12-02 DIAGNOSIS — Z13220 Encounter for screening for lipoid disorders: Secondary | ICD-10-CM | POA: Diagnosis not present

## 2013-12-02 DIAGNOSIS — Z1329 Encounter for screening for other suspected endocrine disorder: Secondary | ICD-10-CM | POA: Diagnosis not present

## 2013-12-02 DIAGNOSIS — Z0000 Encounter for general adult medical examination without abnormal findings: Secondary | ICD-10-CM | POA: Diagnosis not present

## 2013-12-02 DIAGNOSIS — N959 Unspecified menopausal and perimenopausal disorder: Secondary | ICD-10-CM | POA: Diagnosis not present

## 2014-01-06 DIAGNOSIS — H9209 Otalgia, unspecified ear: Secondary | ICD-10-CM | POA: Diagnosis not present

## 2014-01-11 DIAGNOSIS — M461 Sacroiliitis, not elsewhere classified: Secondary | ICD-10-CM | POA: Diagnosis not present

## 2014-01-11 DIAGNOSIS — M7071 Other bursitis of hip, right hip: Secondary | ICD-10-CM | POA: Diagnosis not present

## 2014-01-11 DIAGNOSIS — M542 Cervicalgia: Secondary | ICD-10-CM | POA: Diagnosis not present

## 2014-01-11 DIAGNOSIS — M797 Fibromyalgia: Secondary | ICD-10-CM | POA: Diagnosis not present

## 2014-01-17 DIAGNOSIS — E039 Hypothyroidism, unspecified: Secondary | ICD-10-CM | POA: Diagnosis not present

## 2014-01-27 DIAGNOSIS — R5382 Chronic fatigue, unspecified: Secondary | ICD-10-CM | POA: Diagnosis not present

## 2014-01-27 DIAGNOSIS — Z209 Contact with and (suspected) exposure to unspecified communicable disease: Secondary | ICD-10-CM | POA: Diagnosis not present

## 2014-01-27 DIAGNOSIS — Z0000 Encounter for general adult medical examination without abnormal findings: Secondary | ICD-10-CM | POA: Diagnosis not present

## 2014-01-27 DIAGNOSIS — H9193 Unspecified hearing loss, bilateral: Secondary | ICD-10-CM | POA: Diagnosis not present

## 2014-01-27 DIAGNOSIS — J4530 Mild persistent asthma, uncomplicated: Secondary | ICD-10-CM | POA: Diagnosis not present

## 2014-01-27 DIAGNOSIS — J309 Allergic rhinitis, unspecified: Secondary | ICD-10-CM | POA: Diagnosis not present

## 2014-01-27 DIAGNOSIS — Z8619 Personal history of other infectious and parasitic diseases: Secondary | ICD-10-CM | POA: Diagnosis not present

## 2014-01-27 DIAGNOSIS — Z13220 Encounter for screening for lipoid disorders: Secondary | ICD-10-CM | POA: Diagnosis not present

## 2014-01-27 DIAGNOSIS — M797 Fibromyalgia: Secondary | ICD-10-CM | POA: Diagnosis not present

## 2014-01-28 ENCOUNTER — Other Ambulatory Visit: Payer: Self-pay | Admitting: Dermatology

## 2014-01-28 DIAGNOSIS — D1801 Hemangioma of skin and subcutaneous tissue: Secondary | ICD-10-CM | POA: Diagnosis not present

## 2014-02-03 ENCOUNTER — Encounter (HOSPITAL_COMMUNITY): Payer: Self-pay | Admitting: Internal Medicine

## 2014-03-29 DIAGNOSIS — Z79899 Other long term (current) drug therapy: Secondary | ICD-10-CM | POA: Diagnosis not present

## 2014-03-29 DIAGNOSIS — R7989 Other specified abnormal findings of blood chemistry: Secondary | ICD-10-CM | POA: Diagnosis not present

## 2014-03-29 DIAGNOSIS — A6920 Lyme disease, unspecified: Secondary | ICD-10-CM | POA: Diagnosis not present

## 2014-04-18 DIAGNOSIS — R7989 Other specified abnormal findings of blood chemistry: Secondary | ICD-10-CM | POA: Diagnosis not present

## 2014-04-18 DIAGNOSIS — Z79899 Other long term (current) drug therapy: Secondary | ICD-10-CM | POA: Diagnosis not present

## 2014-04-18 DIAGNOSIS — A6920 Lyme disease, unspecified: Secondary | ICD-10-CM | POA: Diagnosis not present

## 2014-05-24 DIAGNOSIS — Z79899 Other long term (current) drug therapy: Secondary | ICD-10-CM | POA: Diagnosis not present

## 2014-05-24 DIAGNOSIS — R7989 Other specified abnormal findings of blood chemistry: Secondary | ICD-10-CM | POA: Diagnosis not present

## 2014-05-24 DIAGNOSIS — A6920 Lyme disease, unspecified: Secondary | ICD-10-CM | POA: Diagnosis not present

## 2014-06-09 DIAGNOSIS — N941 Dyspareunia: Secondary | ICD-10-CM | POA: Diagnosis not present

## 2014-06-21 DIAGNOSIS — Z779 Other contact with and (suspected) exposures hazardous to health: Secondary | ICD-10-CM | POA: Diagnosis not present

## 2014-06-21 DIAGNOSIS — N941 Dyspareunia: Secondary | ICD-10-CM | POA: Diagnosis not present

## 2014-07-12 DIAGNOSIS — G4701 Insomnia due to medical condition: Secondary | ICD-10-CM | POA: Diagnosis not present

## 2014-07-12 DIAGNOSIS — M7071 Other bursitis of hip, right hip: Secondary | ICD-10-CM | POA: Diagnosis not present

## 2014-07-12 DIAGNOSIS — M797 Fibromyalgia: Secondary | ICD-10-CM | POA: Diagnosis not present

## 2014-07-12 DIAGNOSIS — R5383 Other fatigue: Secondary | ICD-10-CM | POA: Diagnosis not present

## 2014-07-19 ENCOUNTER — Other Ambulatory Visit: Payer: Self-pay | Admitting: Internal Medicine

## 2014-07-19 DIAGNOSIS — R2689 Other abnormalities of gait and mobility: Secondary | ICD-10-CM

## 2014-07-19 DIAGNOSIS — R519 Headache, unspecified: Secondary | ICD-10-CM

## 2014-07-19 DIAGNOSIS — R413 Other amnesia: Secondary | ICD-10-CM

## 2014-07-19 DIAGNOSIS — R51 Headache: Principal | ICD-10-CM

## 2014-08-02 DIAGNOSIS — R002 Palpitations: Secondary | ICD-10-CM | POA: Diagnosis not present

## 2014-08-06 ENCOUNTER — Ambulatory Visit
Admission: RE | Admit: 2014-08-06 | Discharge: 2014-08-06 | Disposition: A | Discharge status: Home / Self Care | Payer: Medicare Other | Source: Ambulatory Visit | Attending: Internal Medicine | Admitting: Internal Medicine

## 2014-08-06 DIAGNOSIS — R519 Headache, unspecified: Secondary | ICD-10-CM

## 2014-08-06 DIAGNOSIS — R413 Other amnesia: Secondary | ICD-10-CM

## 2014-08-06 DIAGNOSIS — R251 Tremor, unspecified: Secondary | ICD-10-CM | POA: Diagnosis not present

## 2014-08-06 DIAGNOSIS — R51 Headache: Principal | ICD-10-CM

## 2014-08-06 DIAGNOSIS — R200 Anesthesia of skin: Secondary | ICD-10-CM | POA: Diagnosis not present

## 2014-08-06 DIAGNOSIS — R2689 Other abnormalities of gait and mobility: Secondary | ICD-10-CM

## 2014-12-14 DIAGNOSIS — Z5181 Encounter for therapeutic drug level monitoring: Secondary | ICD-10-CM | POA: Diagnosis not present

## 2015-01-26 DIAGNOSIS — Z01419 Encounter for gynecological examination (general) (routine) without abnormal findings: Secondary | ICD-10-CM | POA: Diagnosis not present

## 2015-01-26 DIAGNOSIS — Z6828 Body mass index (BMI) 28.0-28.9, adult: Secondary | ICD-10-CM | POA: Diagnosis not present

## 2015-02-01 ENCOUNTER — Encounter (HOSPITAL_COMMUNITY): Payer: Self-pay | Admitting: Emergency Medicine

## 2015-02-01 ENCOUNTER — Emergency Department (HOSPITAL_COMMUNITY)
Admission: EM | Admit: 2015-02-01 | Discharge: 2015-02-01 | Disposition: A | Discharge status: Home / Self Care | Payer: 59 | Attending: Emergency Medicine | Admitting: Emergency Medicine

## 2015-02-01 DIAGNOSIS — Z87828 Personal history of other (healed) physical injury and trauma: Secondary | ICD-10-CM | POA: Diagnosis not present

## 2015-02-01 DIAGNOSIS — D696 Thrombocytopenia, unspecified: Secondary | ICD-10-CM | POA: Insufficient documentation

## 2015-02-01 DIAGNOSIS — E063 Autoimmune thyroiditis: Secondary | ICD-10-CM | POA: Insufficient documentation

## 2015-02-01 DIAGNOSIS — F419 Anxiety disorder, unspecified: Secondary | ICD-10-CM | POA: Diagnosis not present

## 2015-02-01 DIAGNOSIS — R7989 Other specified abnormal findings of blood chemistry: Secondary | ICD-10-CM | POA: Diagnosis present

## 2015-02-01 DIAGNOSIS — M1990 Unspecified osteoarthritis, unspecified site: Secondary | ICD-10-CM | POA: Diagnosis not present

## 2015-02-01 DIAGNOSIS — D649 Anemia, unspecified: Secondary | ICD-10-CM | POA: Diagnosis not present

## 2015-02-01 DIAGNOSIS — Z792 Long term (current) use of antibiotics: Secondary | ICD-10-CM | POA: Insufficient documentation

## 2015-02-01 DIAGNOSIS — Z7952 Long term (current) use of systemic steroids: Secondary | ICD-10-CM | POA: Insufficient documentation

## 2015-02-01 DIAGNOSIS — Z8742 Personal history of other diseases of the female genital tract: Secondary | ICD-10-CM | POA: Insufficient documentation

## 2015-02-01 DIAGNOSIS — G8929 Other chronic pain: Secondary | ICD-10-CM | POA: Insufficient documentation

## 2015-02-01 DIAGNOSIS — Z79899 Other long term (current) drug therapy: Secondary | ICD-10-CM | POA: Diagnosis not present

## 2015-02-01 DIAGNOSIS — M797 Fibromyalgia: Secondary | ICD-10-CM | POA: Insufficient documentation

## 2015-02-01 DIAGNOSIS — M7981 Nontraumatic hematoma of soft tissue: Secondary | ICD-10-CM | POA: Diagnosis not present

## 2015-02-01 DIAGNOSIS — F329 Major depressive disorder, single episode, unspecified: Secondary | ICD-10-CM | POA: Diagnosis not present

## 2015-02-01 DIAGNOSIS — A6920 Lyme disease, unspecified: Secondary | ICD-10-CM | POA: Insufficient documentation

## 2015-02-01 DIAGNOSIS — Z973 Presence of spectacles and contact lenses: Secondary | ICD-10-CM | POA: Diagnosis not present

## 2015-02-01 DIAGNOSIS — J45909 Unspecified asthma, uncomplicated: Secondary | ICD-10-CM | POA: Insufficient documentation

## 2015-02-01 LAB — CBC WITH DIFFERENTIAL/PLATELET
Basophils Absolute: 0 10*3/uL (ref 0.0–0.1)
Basophils Relative: 1 %
Eosinophils Absolute: 0.1 10*3/uL (ref 0.0–0.7)
Eosinophils Relative: 3 %
HEMATOCRIT: 31.3 % — AB (ref 36.0–46.0)
HEMOGLOBIN: 10.5 g/dL — AB (ref 12.0–15.0)
LYMPHS PCT: 21 %
Lymphs Abs: 0.9 10*3/uL (ref 0.7–4.0)
MCH: 30.2 pg (ref 26.0–34.0)
MCHC: 33.5 g/dL (ref 30.0–36.0)
MCV: 89.9 fL (ref 78.0–100.0)
MONOS PCT: 7 %
Monocytes Absolute: 0.3 10*3/uL (ref 0.1–1.0)
NEUTROS ABS: 2.9 10*3/uL (ref 1.7–7.7)
Neutrophils Relative %: 68 %
Platelets: 104 10*3/uL — ABNORMAL LOW (ref 150–400)
RBC: 3.48 MIL/uL — AB (ref 3.87–5.11)
RDW: 13.8 % (ref 11.5–15.5)
WBC: 4.2 10*3/uL (ref 4.0–10.5)

## 2015-02-01 LAB — PROTIME-INR
INR: 1.04 (ref 0.00–1.49)
Prothrombin Time: 13.8 seconds (ref 11.6–15.2)

## 2015-02-01 LAB — COMPREHENSIVE METABOLIC PANEL
ALT: 26 U/L (ref 14–54)
AST: 25 U/L (ref 15–41)
Albumin: 3.8 g/dL (ref 3.5–5.0)
Alkaline Phosphatase: 59 U/L (ref 38–126)
Anion gap: 6 (ref 5–15)
BUN: 14 mg/dL (ref 6–20)
CHLORIDE: 109 mmol/L (ref 101–111)
CO2: 28 mmol/L (ref 22–32)
Calcium: 9.4 mg/dL (ref 8.9–10.3)
Creatinine, Ser: 0.99 mg/dL (ref 0.44–1.00)
GFR calc Af Amer: >60 mL/min (ref >60)
GFR calc non Af Amer: >60 mL/min (ref >60)
GLUCOSE: 112 mg/dL — AB (ref 65–99)
POTASSIUM: 3.8 mmol/L (ref 3.5–5.1)
SODIUM: 143 mmol/L (ref 135–145)
Total Bilirubin: 0.5 mg/dL (ref 0.3–1.2)
Total Protein: 6.6 g/dL (ref 6.5–8.1)

## 2015-02-01 LAB — APTT: aPTT: 26 seconds (ref 24–37)

## 2015-02-01 NOTE — ED Notes (Signed)
She ambulates without difficulty to b.r. And back.

## 2015-02-01 NOTE — ED Notes (Signed)
Pt being sent by Tona Sensing MD w/ [facility] in [place], [place].  Pt had recent abnormal blood work (PLT 12).  Hx of Lyme disease.   Attending on-call for practice: [phone].

## 2015-02-01 NOTE — ED Provider Notes (Signed)
CSN: [id number]     Arrival date & time 02/01/15  1502 History   First MD Initiated Contact with Patient 02/01/15 1541     Chief Complaint  Patient presents with  . abnormal blood levels      (Consider location/radiation/quality/duration/timing/severity/associated sxs/prior Treatment) HPI [name] is a 54 y.o. female with history of Hashimoto's thyroiditis, fibromyalgia, asthma, depression, chronic Lyme's disease, currently undergoing experimental treatment at [facility]. Patient states about a month ago they started her on rifampin. Patient states that every few weeks, patient undergoes blood work testing to make sure her values are normal. She states that she had blood work done 2 days ago and was called today and told that her platelets are 12. Patient denies any history of any blood disorders. She denies any history of anemia. She denies any active bleeding. She does not take any blood thinners. Patient states that she does have several bruises but attributes it to a "active foster child." Patient also reports several red spots to her arms and legs which have been there for the last month. She denies any nasal bleeding, no blood in her urine, no other active sources of bleeding at this time.  Past Medical History  Diagnosis Date  . Hashimoto thyroiditis   . Depression   . Asthma   . Vitamin D deficiency   . Fibromyalgia   . Allergic rhinitis   . History of babesiosis     PARASITITIS RESOLVED  . H/O hiatal hernia   . Anxiety   . Abnormal uterine bleeding (AUB)   . History of hyperparathyroidism     PRIMARY--  S/P PARATHYROIDECTOMY 01-28-2011  . History of Bell's palsy     2000--  resolved  . History of uterine fibroid   . Osteoarthritis     all joints secondary to lyme disease/  back is worse  . Chronic back pain     oa  . Hypoglycemia   . Musculoskeletal chest pain     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  . History of palpitations     stress induced  . History of  concussion     age 52--  no residual  . B12 deficiency   . GERD (gastroesophageal reflux disease)     watches diet  . Wears contact lenses   . Bruises easily   . Lyme disease     CHRONIC --  FOR 22 YRS (SINCE APPROX 1996)  AND HX CAT SCRATCH DISEASE WHICH HAS RESOLVED   Past Surgical History  Procedure Laterality Date  . Parathyroidectomy  01/28/2011    Procedure: PARATHYROIDECTOMY;  Surgeon: Earnstine Regal, MD;  Location: [facility]S;  Service: General;  Laterality: N/A;   . Tilt table study  08-21-2011    DR KLEIN    BP WERE CONSISTENT 110-117 THROUGH OUT STUDY/ BASELINE HR 68-70  . Stress echo report  09-01-2009  DR [place]    NORMAL LVF/  EF 60%/  NORMAL HYPERDYNAMIC RESPONSE  . Tonsillectomy  age 29  . Hysteroscopic myomectomy and abdominal myomectomy  2006  . Ganglion cyst excision  2007    HAND  . Dilatation & currettage/hysteroscopy with resectocope N/A 12/02/2012    Procedure: [place];  Surgeon: Darlyn Chamber, MD;  Location: [facility];  Service: Gynecology;  Laterality: N/A;  . Tilt table study N/A 08/21/2011    Procedure: TILT TABLE STUDY;  Surgeon: Deboraha Sprang, MD;  Location: [facility] CATH LAB;  Service: Cardiovascular;  Laterality: N/A;   Family History  Problem Relation Age of Onset  . Mitral valve prolapse Father   . Hypertension Father   . Dementia Father   . Hypothyroidism Mother   . Colon polyps Brother   . Aneurysm Brother   . Arthritis Brother   . Lupus Sister   . Rheum arthritis Sister   . Osteoarthritis Sister   . Cancer Maternal Aunt     breast   Social History  Substance Use Topics  . Smoking status: Never Smoker   . Smokeless tobacco: Never Used  . Alcohol Use: Yes     Comment: occasional wine    OB History    No data available     Review of Systems  Constitutional: Negative for fever and chills.  Respiratory: Negative for cough, chest tightness and shortness of breath.   Cardiovascular:  Negative for chest pain, palpitations and leg swelling.  Gastrointestinal: Negative for nausea, vomiting, abdominal pain and diarrhea.  Genitourinary: Negative for dysuria, flank pain, vaginal bleeding, vaginal discharge, vaginal pain and pelvic pain.  Musculoskeletal: Negative for myalgias, arthralgias, neck pain and neck stiffness.  Skin: Negative for rash and wound.  Neurological: Negative for dizziness, weakness and headaches.  All other systems reviewed and are negative.     Allergies  Contrast media; Flagyl; and Tinidazole  Home Medications   Prior to Admission medications   Medication Sig Start Date End Date Taking? Authorizing Provider  ARMOUR THYROID 60 MG tablet Take 60 mg by mouth daily. 01/12/15  Yes Historical Provider, MD  cefixime (SUPRAX) 400 MG tablet Take 400 mg by mouth daily.   Yes Historical Provider, MD  clonazePAM (KLONOPIN) 0.5 MG tablet Take 0.5 mg by mouth at bedtime as needed. For sleep   Yes Historical Provider, MD  dapsone 100 MG tablet Take 100 mg by mouth daily.   Yes Historical Provider, MD  Doxycycline Hyclate 150 MG TABS Take 150 mg by mouth 2 (two) times daily.   Yes Historical Provider, MD  DULoxetine (CYMBALTA) 60 MG capsule Take 60 mg by mouth every morning.    Yes Historical Provider, MD  fluconazole (DIFLUCAN) 100 MG tablet Take 100 mg by mouth. Twice weekly on days between Rifampin treatments.   Yes Historical Provider, MD  gabapentin (NEURONTIN) 300 MG capsule Take 300 mg by mouth at bedtime.    Yes Historical Provider, MD  GLUTATHIONE PO Take 1 tablet by mouth every 3 (three) days.    Yes Historical Provider, MD  Grapefruit Seed 60 % EXTR Take 1 drop by mouth 2 (two) times daily.    Yes Historical Provider, MD  hydrocortisone (CORTEF) 5 MG tablet Take 10 mg by mouth daily.  12/24/10  Yes Historical Provider, MD  hydroxychloroquine (PLAQUENIL) 200 MG tablet Take 200 mg by mouth 2 (two) times daily.   Yes Historical Provider, MD  L-Methylfolate  (DEPLIN) 15 MG TABS Take by mouth.   Yes Historical Provider, MD  leucovorin (WELLCOVORIN) 15 MG tablet Take 15 mg by mouth daily. with food 01/24/15  Yes Historical Provider, MD  levalbuterol (XOPENEX HFA) 45 MCG/ACT inhaler Inhale 1 puff into the lungs every 4 (four) hours as needed. For shortness of breath   Yes Historical Provider, MD  levocetirizine (XYZAL) 5 MG tablet Take 5 mg by mouth daily.  12/21/10  Yes Historical Provider, MD  MAGNESIUM CHLORIDE PO Take 1 tablet by mouth daily.    Yes Historical Provider, MD  montelukast (SINGULAIR) 10 MG tablet Take 10 mg  by mouth at bedtime.  12/21/10  Yes Historical Provider, MD  Multiple Vitamins-Minerals (MULTIVITAMINS THER. W/MINERALS) TABS Take 1 tablet by mouth daily.    Yes Historical Provider, MD  nystatin (MYCOSTATIN) 500000 UNITS TABS Take 2 tablets by mouth 2 (two) times daily.    Yes Historical Provider, MD  ondansetron (ZOFRAN-ODT) 8 MG disintegrating tablet Take 1 tablet by mouth every 8 (eight) hours as needed. Nausea/vomiting. 11/22/14  Yes Historical Provider, MD  progesterone (PROMETRIUM) 100 MG capsule Take 100 mg by mouth daily. 01/30/15  Yes Historical Provider, MD  rifampin (RIFADIN) 300 MG capsule Take 600 mg by mouth 2 (two) times daily. On Wednesday and Saturday.   Yes Historical Provider, MD  traMADol (ULTRAM) 50 MG tablet Take 50 mg by mouth every 12 (twelve) hours as needed for moderate pain or severe pain.   Yes Historical Provider, MD  traZODone (DESYREL) 150 MG tablet Take by mouth at bedtime.   Yes Historical Provider, MD   BP 154/84 mmHg  Pulse 100  Temp(Src) 97.9 F (36.6 C) (Oral)  Resp 19  SpO2 98%  LMP 01/22/2011 Physical Exam  Constitutional: She is oriented to person, place, and time. She appears well-developed and well-nourished. No distress.  HENT:  Head: Normocephalic.  Eyes: Conjunctivae are normal.  Neck: Neck supple.  Cardiovascular: Normal rate, regular rhythm and normal heart sounds.    Pulmonary/Chest: Effort normal and breath sounds normal. No respiratory distress. She has no wheezes. She has no rales.  Abdominal: Soft. Bowel sounds are normal. She exhibits no distension. There is no tenderness. There is no rebound.  Musculoskeletal: She exhibits no edema.  Neurological: She is alert and oriented to person, place, and time.  Skin: Skin is warm and dry.  Few small contusions to bilateral arms. Also rare scattered small petechiae to forearms and lower legs.   Psychiatric: She has a normal mood and affect. Her behavior is normal.  Nursing note and vitals reviewed.   ED Course  Procedures (including critical care time) Labs Review Labs Reviewed  CBC WITH DIFFERENTIAL/PLATELET - Abnormal; Notable for the following:    RBC 3.48 (*)    Hemoglobin 10.5 (*)    HCT 31.3 (*)    Platelets 104 (*)    All other components within normal limits  COMPREHENSIVE METABOLIC PANEL - Abnormal; Notable for the following:    Glucose, Bld 112 (*)    All other components within normal limits  PROTIME-INR  APTT    Imaging Review No results found. I have personally reviewed and evaluated these images and lab results as part of my medical decision-making.   EKG Interpretation None      MDM   Final diagnoses:  Anemia, unspecified anemia type  Thrombocytopenia ([place])    patient to the emergency department with possible platelet level of 12. We will repeat labs. No active bleeding  Platelets at 104. Hemoglobin also slightly lower at 10.5. Discussed results with patient. Will have her call her doctor tomorrow, and get advice on whether to hold off on rifampin. Will also recheck blood work in several days. Bleeding precautions as well as return precautions discussed.  Filed Vitals:   02/01/15 1518 02/01/15 1637 02/01/15 1803  BP: 154/84 129/69 117/67  Pulse: 100 99 88  Temp: 97.9 F (36.6 C) 97.6 F (36.4 C) 98 F (36.7 C)  TempSrc: Oral Oral Oral  Resp: 19 18 18   SpO2: 98%  99% 100%     Jeannett Senior, PA-C 02/02/15 [id number]  Dorie Rank, MD 02/03/15 [phone]

## 2015-02-01 NOTE — ED Notes (Signed)
Pt states that she has Lyme disease and doesn't believe she was treated properly for it and continuing to have to take meds and have blood work  Done.  Pt states that she got called due to abnormal lab work that was done on Monday.

## 2015-02-01 NOTE — Discharge Instructions (Signed)
Keep an eye on any bleeding. Call your doctor tomorrow to check if should stop rifampin. Return if any bleeding, bruising, or any new concerning symptoms.   Thrombocytopenia Thrombocytopenia is a condition in which there is an abnormally small number of platelets in your blood. Platelets are also called thrombocytes. Platelets are needed for blood clotting. CAUSES Thrombocytopenia is caused by:   Decreased production of platelets. This can be caused by:  Aplastic anemia in which your bone marrow quits making blood cells.  Cancer in the bone marrow.  Use of certain medicines, including chemotherapy.  Infection in the bone marrow.  Heavy alcohol consumption.  Increased destruction of platelets. This can be caused by:  Certain immune diseases.  Use of certain drugs.  Certain blood clotting disorders.  Certain inherited disorders.  Certain bleeding disorders.  Pregnancy.  Having an enlarged spleen (hypersplenism). In hypersplenism, the spleen gathers up platelets from circulation. This means the platelets are not available to help with blood clotting. The spleen can enlarge due to cirrhosis or other conditions. SYMPTOMS  The symptoms of thrombocytopenia are side effects of poor blood clotting. Some of these are:  Abnormal bleeding.  Nosebleeds.  Heavy menstrual periods.  Blood in the urine or stools.  Purpura. This is a purplish discoloration in the skin produced by small bleeding vessels near the surface of the skin.  Bruising.  A rash that may be petechial. This looks like pinpoint, purplish-red spots on the skin and mucous membranes. It is caused by bleeding from small blood vessels (capillaries). DIAGNOSIS  Your caregiver will make this diagnosis based on your exam and blood tests. Sometimes, a bone marrow study is done to look for the original cells (megakaryocytes) that make platelets. TREATMENT  Treatment depends on the cause of the condition.  Medicines may  be given to help protect your platelets from being destroyed.  In some cases, a replacement (transfusion) of platelets may be required to stop or prevent bleeding.  Sometimes, the spleen must be surgically removed. HOME CARE INSTRUCTIONS   Check the skin and linings inside your mouth for bruising or bleeding as directed by your caregiver.  Check your sputum, urine, and stool for blood as directed by your caregiver.  Do not return to any activities that could cause bumps or bruises until your caregiver says it is okay.  Take extra care not to cut yourself when shaving or when using scissors, needles, knives, and other tools.  Take extra care not to burn yourself when ironing or cooking.  Ask your caregiver if it is okay for you to drink alcohol.  Only take over-the-counter or prescription medicines as directed by your caregiver.  Notify all your caregivers, including dentists and eye doctors, about your condition. SEEK IMMEDIATE MEDICAL CARE IF:   You develop active bleeding from anywhere in your body.  You develop unexplained bruising or bleeding.  You have blood in your sputum, urine, or stool. MAKE SURE YOU:  Understand these instructions.  Will watch your condition.  Will get help right away if you are not doing well or get worse.   This information is not intended to replace advice given to you by your health care provider. Make sure you discuss any questions you have with your health care provider.   Document Released: 02/11/2005 Document Revised: 05/06/2011 Document Reviewed: 08/15/2014 Elsevier Interactive Patient Education [employer].

## 2015-02-14 DIAGNOSIS — G894 Chronic pain syndrome: Secondary | ICD-10-CM | POA: Diagnosis not present

## 2015-02-14 DIAGNOSIS — Z79899 Other long term (current) drug therapy: Secondary | ICD-10-CM | POA: Diagnosis not present

## 2015-02-14 DIAGNOSIS — J45909 Unspecified asthma, uncomplicated: Secondary | ICD-10-CM | POA: Diagnosis not present

## 2015-02-14 DIAGNOSIS — R0982 Postnasal drip: Secondary | ICD-10-CM | POA: Diagnosis not present

## 2015-02-14 DIAGNOSIS — Z5181 Encounter for therapeutic drug level monitoring: Secondary | ICD-10-CM | POA: Diagnosis not present

## 2015-02-14 DIAGNOSIS — M797 Fibromyalgia: Secondary | ICD-10-CM | POA: Diagnosis not present

## 2015-02-14 DIAGNOSIS — Z79891 Long term (current) use of opiate analgesic: Secondary | ICD-10-CM | POA: Diagnosis not present

## 2015-02-14 DIAGNOSIS — R05 Cough: Secondary | ICD-10-CM | POA: Diagnosis not present

## 2015-02-14 DIAGNOSIS — M545 Low back pain: Secondary | ICD-10-CM | POA: Diagnosis not present

## 2015-02-14 DIAGNOSIS — I519 Heart disease, unspecified: Secondary | ICD-10-CM | POA: Diagnosis not present

## 2015-03-03 DIAGNOSIS — Z779 Other contact with and (suspected) exposures hazardous to health: Secondary | ICD-10-CM | POA: Diagnosis not present

## 2015-03-03 DIAGNOSIS — R05 Cough: Secondary | ICD-10-CM | POA: Diagnosis not present

## 2015-03-03 DIAGNOSIS — J3489 Other specified disorders of nose and nasal sinuses: Secondary | ICD-10-CM | POA: Diagnosis not present

## 2015-03-03 DIAGNOSIS — J329 Chronic sinusitis, unspecified: Secondary | ICD-10-CM | POA: Diagnosis not present

## 2015-03-14 DIAGNOSIS — A6920 Lyme disease, unspecified: Secondary | ICD-10-CM | POA: Diagnosis not present

## 2015-03-14 DIAGNOSIS — R7989 Other specified abnormal findings of blood chemistry: Secondary | ICD-10-CM | POA: Diagnosis not present

## 2015-03-14 DIAGNOSIS — Z79899 Other long term (current) drug therapy: Secondary | ICD-10-CM | POA: Diagnosis not present

## 2015-04-03 DIAGNOSIS — M25552 Pain in left hip: Secondary | ICD-10-CM | POA: Diagnosis not present

## 2015-04-03 DIAGNOSIS — M25551 Pain in right hip: Secondary | ICD-10-CM | POA: Diagnosis not present

## 2015-04-06 DIAGNOSIS — N760 Acute vaginitis: Secondary | ICD-10-CM | POA: Diagnosis not present

## 2015-04-24 DIAGNOSIS — A6920 Lyme disease, unspecified: Secondary | ICD-10-CM | POA: Diagnosis not present

## 2015-04-24 DIAGNOSIS — M797 Fibromyalgia: Secondary | ICD-10-CM | POA: Diagnosis not present

## 2015-04-24 DIAGNOSIS — R5381 Other malaise: Secondary | ICD-10-CM | POA: Diagnosis not present

## 2015-04-24 DIAGNOSIS — G4709 Other insomnia: Secondary | ICD-10-CM | POA: Diagnosis not present

## 2015-05-15 ENCOUNTER — Other Ambulatory Visit
Admission: RE | Admit: 2015-05-15 | Discharge: 2015-05-15 | Disposition: A | Discharge status: Home / Self Care | Payer: 59 | Source: Ambulatory Visit | Attending: Nurse Practitioner | Admitting: Nurse Practitioner

## 2015-05-15 DIAGNOSIS — Z79899 Other long term (current) drug therapy: Secondary | ICD-10-CM | POA: Insufficient documentation

## 2015-05-15 DIAGNOSIS — A6920 Lyme disease, unspecified: Secondary | ICD-10-CM | POA: Insufficient documentation

## 2015-05-15 DIAGNOSIS — R7989 Other specified abnormal findings of blood chemistry: Secondary | ICD-10-CM | POA: Diagnosis not present

## 2015-06-06 LAB — MISC LABCORP TEST (SEND OUT): LABCORP TEST CODE: 7047

## 2015-09-22 DIAGNOSIS — A6920 Lyme disease, unspecified: Secondary | ICD-10-CM | POA: Diagnosis not present

## 2015-09-22 DIAGNOSIS — R5383 Other fatigue: Secondary | ICD-10-CM | POA: Diagnosis not present

## 2015-09-22 DIAGNOSIS — A449 Bartonellosis, unspecified: Secondary | ICD-10-CM | POA: Diagnosis not present

## 2015-09-22 DIAGNOSIS — Z789 Other specified health status: Secondary | ICD-10-CM | POA: Diagnosis not present

## 2015-09-22 DIAGNOSIS — R42 Dizziness and giddiness: Secondary | ICD-10-CM | POA: Diagnosis not present

## 2015-09-22 DIAGNOSIS — R5382 Chronic fatigue, unspecified: Secondary | ICD-10-CM | POA: Diagnosis not present

## 2015-09-22 DIAGNOSIS — R4189 Other symptoms and signs involving cognitive functions and awareness: Secondary | ICD-10-CM | POA: Diagnosis not present

## 2015-09-22 DIAGNOSIS — E039 Hypothyroidism, unspecified: Secondary | ICD-10-CM | POA: Diagnosis not present

## 2015-09-22 DIAGNOSIS — D518 Other vitamin B12 deficiency anemias: Secondary | ICD-10-CM | POA: Diagnosis not present

## 2015-09-22 DIAGNOSIS — R7989 Other specified abnormal findings of blood chemistry: Secondary | ICD-10-CM | POA: Diagnosis not present

## 2015-09-22 DIAGNOSIS — M2550 Pain in unspecified joint: Secondary | ICD-10-CM | POA: Diagnosis not present

## 2015-09-22 DIAGNOSIS — M791 Myalgia: Secondary | ICD-10-CM | POA: Diagnosis not present

## 2015-10-17 DIAGNOSIS — J4530 Mild persistent asthma, uncomplicated: Secondary | ICD-10-CM | POA: Diagnosis not present

## 2015-10-17 DIAGNOSIS — Z0000 Encounter for general adult medical examination without abnormal findings: Secondary | ICD-10-CM | POA: Diagnosis not present

## 2015-10-17 DIAGNOSIS — H9193 Unspecified hearing loss, bilateral: Secondary | ICD-10-CM | POA: Diagnosis not present

## 2015-10-17 DIAGNOSIS — Z209 Contact with and (suspected) exposure to unspecified communicable disease: Secondary | ICD-10-CM | POA: Diagnosis not present

## 2015-10-17 DIAGNOSIS — R5382 Chronic fatigue, unspecified: Secondary | ICD-10-CM | POA: Diagnosis not present

## 2015-10-17 DIAGNOSIS — J309 Allergic rhinitis, unspecified: Secondary | ICD-10-CM | POA: Diagnosis not present

## 2015-10-17 DIAGNOSIS — J069 Acute upper respiratory infection, unspecified: Secondary | ICD-10-CM | POA: Diagnosis not present

## 2015-10-17 DIAGNOSIS — Z13220 Encounter for screening for lipoid disorders: Secondary | ICD-10-CM | POA: Diagnosis not present

## 2015-10-17 DIAGNOSIS — M797 Fibromyalgia: Secondary | ICD-10-CM | POA: Diagnosis not present

## 2015-10-17 DIAGNOSIS — Z8619 Personal history of other infectious and parasitic diseases: Secondary | ICD-10-CM | POA: Diagnosis not present

## 2015-11-03 DIAGNOSIS — Z5181 Encounter for therapeutic drug level monitoring: Secondary | ICD-10-CM | POA: Diagnosis not present

## 2015-11-09 DIAGNOSIS — N951 Menopausal and female climacteric states: Secondary | ICD-10-CM | POA: Diagnosis not present

## 2015-11-09 DIAGNOSIS — R232 Flushing: Secondary | ICD-10-CM | POA: Diagnosis not present

## 2015-11-29 ENCOUNTER — Ambulatory Visit (INDEPENDENT_AMBULATORY_CARE_PROVIDER_SITE_OTHER): Payer: 59 | Admitting: Rheumatology

## 2015-11-29 DIAGNOSIS — G4709 Other insomnia: Secondary | ICD-10-CM | POA: Diagnosis not present

## 2015-11-29 DIAGNOSIS — M797 Fibromyalgia: Secondary | ICD-10-CM

## 2015-11-29 DIAGNOSIS — R5381 Other malaise: Secondary | ICD-10-CM | POA: Diagnosis not present

## 2015-11-29 DIAGNOSIS — M810 Age-related osteoporosis without current pathological fracture: Secondary | ICD-10-CM | POA: Diagnosis not present

## 2015-12-05 ENCOUNTER — Other Ambulatory Visit: Payer: Self-pay | Admitting: Obstetrics and Gynecology

## 2015-12-05 DIAGNOSIS — Z1231 Encounter for screening mammogram for malignant neoplasm of breast: Secondary | ICD-10-CM

## 2016-01-01 ENCOUNTER — Ambulatory Visit
Admission: RE | Admit: 2016-01-01 | Discharge: 2016-01-01 | Disposition: A | Discharge status: Home / Self Care | Payer: Medicare Other | Source: Ambulatory Visit | Attending: Obstetrics and Gynecology | Admitting: Obstetrics and Gynecology

## 2016-01-01 DIAGNOSIS — Z1231 Encounter for screening mammogram for malignant neoplasm of breast: Secondary | ICD-10-CM | POA: Diagnosis not present

## 2016-02-27 ENCOUNTER — Other Ambulatory Visit: Payer: Self-pay | Admitting: Rheumatology

## 2016-02-28 ENCOUNTER — Telehealth: Payer: Self-pay | Admitting: Rheumatology

## 2016-02-28 ENCOUNTER — Other Ambulatory Visit: Payer: Self-pay | Admitting: *Deleted

## 2016-02-28 NOTE — Telephone Encounter (Signed)
LMOM for patient to call back to schedule appointment.  

## 2016-02-28 NOTE — Telephone Encounter (Signed)
-----   Message from Carole Binning, LPN sent at [id number]  9:13 AM EST ----- Regarding: Please schedule patient for a follow up appointment Please schedule patient for a follow up appointment. Patient due April 2018. Thanks!

## 2016-02-28 NOTE — Telephone Encounter (Signed)
Last Visit: 11/29/15 Next Visit due in April 2018. Message sent to the front to schedule patient.  Uds: 11/07/15 Narc Agreement: 12/12/14 Left message for patient to call the office to advise her we need to update her narcotic agreement.   Okay to refill Tramadol?

## 2016-02-28 NOTE — Telephone Encounter (Signed)
Since the narcotic agreement needs to be updated since the last one was 12/12/2014, we will refill the tramadol this time but no more refills until narcotic agreement is updated. Patient may need to come into the office just to sign that form.

## 2016-03-04 NOTE — Telephone Encounter (Signed)
Left message for patient to advised would be mailing a copy of the narcotic agreement, needs to be updated.

## 2016-03-06 DIAGNOSIS — A440 Systemic bartonellosis: Secondary | ICD-10-CM | POA: Diagnosis not present

## 2016-03-06 DIAGNOSIS — A6920 Lyme disease, unspecified: Secondary | ICD-10-CM | POA: Diagnosis not present

## 2016-03-15 DIAGNOSIS — A6920 Lyme disease, unspecified: Secondary | ICD-10-CM | POA: Diagnosis not present

## 2016-03-15 DIAGNOSIS — A440 Systemic bartonellosis: Secondary | ICD-10-CM | POA: Diagnosis not present

## 2016-03-22 DIAGNOSIS — A440 Systemic bartonellosis: Secondary | ICD-10-CM | POA: Diagnosis not present

## 2016-03-22 DIAGNOSIS — A6920 Lyme disease, unspecified: Secondary | ICD-10-CM | POA: Diagnosis not present

## 2016-03-29 DIAGNOSIS — A440 Systemic bartonellosis: Secondary | ICD-10-CM | POA: Diagnosis not present

## 2016-03-29 DIAGNOSIS — A6920 Lyme disease, unspecified: Secondary | ICD-10-CM | POA: Diagnosis not present

## 2016-04-05 DIAGNOSIS — A6920 Lyme disease, unspecified: Secondary | ICD-10-CM | POA: Diagnosis not present

## 2016-04-05 DIAGNOSIS — A440 Systemic bartonellosis: Secondary | ICD-10-CM | POA: Diagnosis not present

## 2016-04-08 DIAGNOSIS — Z6833 Body mass index (BMI) 33.0-33.9, adult: Secondary | ICD-10-CM | POA: Diagnosis not present

## 2016-04-08 DIAGNOSIS — Z01419 Encounter for gynecological examination (general) (routine) without abnormal findings: Secondary | ICD-10-CM | POA: Diagnosis not present

## 2016-04-12 DIAGNOSIS — A6920 Lyme disease, unspecified: Secondary | ICD-10-CM | POA: Diagnosis not present

## 2016-04-12 DIAGNOSIS — A440 Systemic bartonellosis: Secondary | ICD-10-CM | POA: Diagnosis not present

## 2016-04-20 DIAGNOSIS — A6920 Lyme disease, unspecified: Secondary | ICD-10-CM | POA: Diagnosis not present

## 2016-04-20 DIAGNOSIS — A440 Systemic bartonellosis: Secondary | ICD-10-CM | POA: Diagnosis not present

## 2016-05-17 DIAGNOSIS — A440 Systemic bartonellosis: Secondary | ICD-10-CM | POA: Diagnosis not present

## 2016-05-17 DIAGNOSIS — A6920 Lyme disease, unspecified: Secondary | ICD-10-CM | POA: Diagnosis not present

## 2016-06-05 ENCOUNTER — Ambulatory Visit: Payer: Medicare Other | Admitting: Rheumatology

## 2016-06-05 DIAGNOSIS — F338 Other recurrent depressive disorders: Secondary | ICD-10-CM | POA: Insufficient documentation

## 2016-06-05 DIAGNOSIS — M8589 Other specified disorders of bone density and structure, multiple sites: Secondary | ICD-10-CM | POA: Insufficient documentation

## 2016-06-05 DIAGNOSIS — Z8709 Personal history of other diseases of the respiratory system: Secondary | ICD-10-CM | POA: Insufficient documentation

## 2016-06-05 DIAGNOSIS — Z8659 Personal history of other mental and behavioral disorders: Secondary | ICD-10-CM | POA: Insufficient documentation

## 2016-06-05 DIAGNOSIS — R5383 Other fatigue: Secondary | ICD-10-CM | POA: Insufficient documentation

## 2016-06-05 DIAGNOSIS — M797 Fibromyalgia: Secondary | ICD-10-CM | POA: Insufficient documentation

## 2016-06-05 DIAGNOSIS — M5136 Other intervertebral disc degeneration, lumbar region: Secondary | ICD-10-CM | POA: Insufficient documentation

## 2016-06-05 DIAGNOSIS — M5030 Other cervical disc degeneration, unspecified cervical region: Secondary | ICD-10-CM | POA: Insufficient documentation

## 2016-06-05 DIAGNOSIS — M51369 Other intervertebral disc degeneration, lumbar region without mention of lumbar back pain or lower extremity pain: Secondary | ICD-10-CM | POA: Insufficient documentation

## 2016-06-05 DIAGNOSIS — G4709 Other insomnia: Secondary | ICD-10-CM | POA: Insufficient documentation

## 2016-06-05 DIAGNOSIS — Z8639 Personal history of other endocrine, nutritional and metabolic disease: Secondary | ICD-10-CM | POA: Insufficient documentation

## 2016-06-05 NOTE — Progress Notes (Signed)
Office Visit Note  Patient: [name]             Date of Birth: Feb 23, 1961           MRN: [id number]             PCP: Vena Austria, MD Referring: Maury Dus, MD Visit Date: 06/19/2016 Occupation: '@GUAROCC' @    Subjective:  Generalized pain.   History of Present Illness: [name] is a 56 y.o. female with history of fibromyalgia disc disease and osteoarthritis. She states she's been off the treatment for Lyme disease now. The treatment was provided by her physician in [place]. She's been carrying her foster child every day and who is some about 16 months now. It increases her lower back pain. She does not have much time for exercise. She has gained weight.  Activities of Daily Living:  Patient reports morning stiffness for all day hours.   Patient Denies nocturnal pain.  Difficulty dressing/grooming: Denies Difficulty climbing stairs: Reports Difficulty getting out of chair: Reports Difficulty using hands for taps, buttons, cutlery, and/or writing: Denies   Review of Systems  Constitutional: Negative for fatigue, night sweats, weight gain, weight loss and weakness.  HENT: Negative for mouth sores, trouble swallowing, trouble swallowing, mouth dryness and nose dryness.   Eyes: Negative for pain, redness, visual disturbance and dryness.  Respiratory: Negative for cough, shortness of breath and difficulty breathing.   Cardiovascular: Negative for chest pain, palpitations, hypertension, irregular heartbeat and swelling in legs/feet.  Gastrointestinal: Negative for blood in stool, constipation and diarrhea.  Endocrine: Negative for increased urination.  Genitourinary: Negative for vaginal dryness.  Musculoskeletal: Positive for arthralgias, joint pain, myalgias, morning stiffness and myalgias. Negative for joint swelling, muscle weakness and muscle tenderness.  Skin: Negative for color change, rash, hair loss, skin tightness, ulcers and sensitivity to  sunlight.  Allergic/Immunologic: Negative for susceptible to infections.  Neurological: Negative for dizziness, memory loss and night sweats.  Hematological: Negative for swollen glands.  Psychiatric/Behavioral: Positive for depressed mood and sleep disturbance. The patient is nervous/anxious.     PMFS History:  Patient Active Problem List   Diagnosis Date Noted  . Fibromyalgia 06/05/2016  . Other insomnia 06/05/2016  . Other fatigue 06/05/2016  . DDD (degenerative disc disease), cervical 06/05/2016  . DDD (degenerative disc disease), lumbar 06/05/2016  . Osteopenia of multiple sites 06/05/2016  . History of asthma 06/05/2016  . History of Hashimoto thyroiditis 06/05/2016  . History of anxiety 06/05/2016  . Seasonal affective disorder ([place]) 06/05/2016  . Postmenopausal bleeding 12/02/2012    Class: Present on Admission  . Fibroids, submucosal 12/02/2012    Class: Present on Admission  . Hx of Lyme disease 06/02/2012  . Postural dizziness 08/08/2011  . Hyperparathyroidism, primary ([place]) 01/08/2011    Past Medical History:  Diagnosis Date  . Abnormal uterine bleeding (AUB)   . Allergic rhinitis   . Anxiety   . Asthma   . B12 deficiency   . Bruises easily   . Chronic back pain    oa  . Depression   . Fibromyalgia   . GERD (gastroesophageal reflux disease)    watches diet  . H/O hiatal hernia   . Hashimoto thyroiditis   . History of babesiosis    PARASITITIS RESOLVED  . History of Bell's palsy    2000--  resolved  . History of concussion    age 59--  no residual  . History of hyperparathyroidism    PRIMARY--  S/P PARATHYROIDECTOMY 01-28-2011  . History of palpitations    stress induced  . History of uterine fibroid   . Hypoglycemia   . Lyme disease    CHRONIC --  FOR 22 YRS (SINCE APPROX 1996)  AND HX CAT SCRATCH DISEASE WHICH HAS RESOLVED  . Musculoskeletal chest pain    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  . Osteoarthritis    all joints secondary to lyme disease/   back is worse  . Vitamin D deficiency   . Wears contact lenses     Family History  Problem Relation Age of Onset  . Mitral valve prolapse Father   . Hypertension Father   . Dementia Father   . Hypothyroidism Mother   . Colon polyps Brother   . Aneurysm Brother   . Arthritis Brother   . Lupus Sister   . Rheum arthritis Sister   . Osteoarthritis Sister   . Cancer Maternal Aunt     breast   Past Surgical History:  Procedure Laterality Date  . DILATATION & CURRETTAGE/HYSTEROSCOPY WITH RESECTOCOPE N/A 12/02/2012   Procedure: DILATATION & CURETTAGE/HYSTEROSCOPY WITH RESECTOCOPE;  Surgeon: Darlyn Chamber, MD;  Location: [facility];  Service: Gynecology;  Laterality: N/A;  . GANGLION CYST EXCISION  2007   HAND  . HYSTEROSCOPIC MYOMECTOMY AND ABDOMINAL MYOMECTOMY  2006  . PARATHYROIDECTOMY  01/28/2011   Procedure: PARATHYROIDECTOMY;  Surgeon: Earnstine Regal, MD;  Location: [facility];  Service: General;  Laterality: N/A;   . STRESS ECHO REPORT  09-01-2009  DR [place]   NORMAL LVF/  EF 60%/  NORMAL HYPERDYNAMIC RESPONSE  . TILT TABLE STUDY  08-21-2011    DR KLEIN   BP WERE CONSISTENT 110-117 THROUGH OUT STUDY/ BASELINE HR 68-70  . TILT TABLE STUDY N/A 08/21/2011   Procedure: TILT TABLE STUDY;  Surgeon: Deboraha Sprang, MD;  Location: [facility];  Service: Cardiovascular;  Laterality: N/A;  . TONSILLECTOMY  age 89   Social History   Social History Narrative  . No narrative on file     Objective: Vital Signs: BP 120/70   Pulse 78   Resp 16   Ht '5\' 4"'  (1.626 m)   Wt 198 lb (89.8 kg)   LMP 01/22/2011   BMI 33.99 kg/m    Physical Exam  Constitutional: She is oriented to person, place, and time. She appears well-developed and well-nourished.  HENT:  Head: Normocephalic and atraumatic.  Eyes: Conjunctivae and EOM are normal.  Neck: Normal range of motion.  Cardiovascular: Normal rate, regular rhythm, normal heart sounds and intact distal pulses.   Pulmonary/Chest:  Effort normal and breath sounds normal.  Abdominal: Soft. Bowel sounds are normal.  Lymphadenopathy:    She has no cervical adenopathy.  Neurological: She is alert and oriented to person, place, and time.  Skin: Skin is warm and dry. Capillary refill takes less than 2 seconds.  Psychiatric: She has a normal mood and affect. Her behavior is normal.  Nursing note and vitals reviewed.    Musculoskeletal Exam: C-spine and thoracic spine good range of motion she has limited range of motion of her lumbar spine. Shoulder joints although joints wrist joint MCPs PIPs DIPs with good range of motion with no synovitis. Hip joints knee joints ankles MTPs PIPs DIPs are good range of motion with no synovitis. Fibromyalgia tender points with 12 out of 18 positive.  CDAI Exam: No CDAI exam completed.    Investigation: Findings:  06/13/2010 Autoimmune disease workup only showed a  positive ANA with a low titer at 1:80 homogenous pattern.  I was concerned that her GFR was low at 53 and calcium level was a little bit high at 10.6, otherwise CMP was normal.  Anti-CCP antibody and RF were negative. TSH was normal. ESR was low at 6.  CK was normal at 82. Hepatitis panel was negative. SPEP was negative.  Vitamin D level was low normal at 37.    Labs from 11/03/2015 shows urine drug screen is negative.  Despite [name] checking for tramadol, it was negative on the examination.  She is using tramadol sparingly.     Imaging: No results found.  Speciality Comments: No specialty comments available.    Procedures:  No procedures performed Allergies: Contrast media [iodinated diagnostic agents]; Flagyl [metronidazole hcl]; and Tinidazole   Assessment / Plan:     Visit Diagnoses: Fibromyalgia: She continues to have some generalized pain and discomfort. Her fatigue is better. Her symptoms are quite well controlled with Cymbalta. I'll check her labs today she's been taking long-term medications.  Other insomnia:  Controlled with medications  Other fatigue: Improved  DDD (degenerative disc disease), cervical: Minimal discomfort  DDD (degenerative disc disease), lumbar: She's been having increased discomfort in her lower back that she's been lifting her foster child. Weight loss diet and exercise was discussed. She's also having some muscle spasms. I've given her a prescription for tizanidine. Side effects were reviewed.  Other chronic pain - On tramadol. Her narcotic agreement was renewed today her UDS was in December 2017. Side effects of medications reviewed as well today  Trochanteric bursitis, right hip: Chronic pain stretching exercises were discussed.  Osteopenia of multiple sites  History of asthma  History of Hashimoto thyroiditis  History of anxiety  History of hyperparathyroidism  Medication monitoring encounter - Plan: CBC with Differential/Platelet, COMPLETE METABOLIC PANEL WITH GFR    Orders: Orders Placed This Encounter  Procedures  . CBC with Differential/Platelet  . COMPLETE METABOLIC PANEL WITH GFR   Meds ordered this encounter  Medications  . tiZANidine (ZANAFLEX) 4 MG tablet    Sig: 1 tablet by mouth daily at bedtime when necessary    Dispense:  30 tablet    Refill:  2    Face-to-face time spent with patient was 30 minutes. 50% of time was spent in counseling and coordination of care.  Follow-Up Instructions: Return in about 6 months (around 12/19/2016) for FMS, DDD.   Bo Merino, MD  Note - This record has been created using Editor, commissioning.  Chart creation errors have been sought, but may not always  have been located. Such creation errors do not reflect on  the standard of medical care.

## 2016-06-19 ENCOUNTER — Encounter: Payer: Self-pay | Admitting: Rheumatology

## 2016-06-19 ENCOUNTER — Ambulatory Visit (INDEPENDENT_AMBULATORY_CARE_PROVIDER_SITE_OTHER): Payer: 59 | Admitting: Rheumatology

## 2016-06-19 VITALS — BP 120/70 | HR 78 | Resp 16 | Ht 64.0 in | Wt 198.0 lb

## 2016-06-19 DIAGNOSIS — R5383 Other fatigue: Secondary | ICD-10-CM

## 2016-06-19 DIAGNOSIS — Z8639 Personal history of other endocrine, nutritional and metabolic disease: Secondary | ICD-10-CM

## 2016-06-19 DIAGNOSIS — Z8659 Personal history of other mental and behavioral disorders: Secondary | ICD-10-CM | POA: Diagnosis not present

## 2016-06-19 DIAGNOSIS — M8589 Other specified disorders of bone density and structure, multiple sites: Secondary | ICD-10-CM | POA: Diagnosis not present

## 2016-06-19 DIAGNOSIS — G8929 Other chronic pain: Secondary | ICD-10-CM | POA: Diagnosis not present

## 2016-06-19 DIAGNOSIS — M7061 Trochanteric bursitis, right hip: Secondary | ICD-10-CM | POA: Diagnosis not present

## 2016-06-19 DIAGNOSIS — Z8709 Personal history of other diseases of the respiratory system: Secondary | ICD-10-CM

## 2016-06-19 DIAGNOSIS — M797 Fibromyalgia: Secondary | ICD-10-CM

## 2016-06-19 DIAGNOSIS — M5030 Other cervical disc degeneration, unspecified cervical region: Secondary | ICD-10-CM

## 2016-06-19 DIAGNOSIS — G4709 Other insomnia: Secondary | ICD-10-CM | POA: Diagnosis not present

## 2016-06-19 DIAGNOSIS — Z5181 Encounter for therapeutic drug level monitoring: Secondary | ICD-10-CM | POA: Diagnosis not present

## 2016-06-19 DIAGNOSIS — M5136 Other intervertebral disc degeneration, lumbar region: Secondary | ICD-10-CM

## 2016-06-19 LAB — CBC WITH DIFFERENTIAL/PLATELET
BASOS PCT: 1 %
Basophils Absolute: 49 cells/uL (ref 0–200)
EOS ABS: 98 {cells}/uL (ref 15–500)
EOS PCT: 2 %
HCT: 42.7 % (ref 35.0–45.0)
Hemoglobin: 14.3 g/dL (ref 11.7–15.5)
Lymphocytes Relative: 41 %
Lymphs Abs: 2009 cells/uL (ref 850–3900)
MCH: 30.5 pg (ref 27.0–33.0)
MCHC: 33.5 g/dL (ref 32.0–36.0)
MCV: 91 fL (ref 80.0–100.0)
MONOS PCT: 8 %
MPV: 12.1 fL (ref 7.5–12.5)
Monocytes Absolute: 392 cells/uL (ref 200–950)
NEUTROS ABS: 2352 {cells}/uL (ref 1500–7800)
Neutrophils Relative %: 48 %
PLATELETS: 185 10*3/uL (ref 140–400)
RBC: 4.69 MIL/uL (ref 3.80–5.10)
RDW: 12.7 % (ref 11.0–15.0)
WBC: 4.9 10*3/uL (ref 3.8–10.8)

## 2016-06-19 MED ORDER — TIZANIDINE HCL 4 MG PO TABS: ORAL_TABLET | ORAL | 2 refills | Status: DC

## 2016-06-20 ENCOUNTER — Telehealth: Payer: Self-pay | Admitting: Radiology

## 2016-06-20 LAB — COMPLETE METABOLIC PANEL WITH GFR
AG Ratio: 2 Ratio (ref 1.0–2.5)
ALT: 26 U/L (ref 6–29)
AST: 22 U/L (ref 10–35)
Albumin: 4.5 g/dL (ref 3.6–5.1)
Alkaline Phosphatase: 68 U/L (ref 33–130)
BUN/Creatinine Ratio: 16.5 Ratio (ref 6–22)
BUN: 15 mg/dL (ref 7–25)
CO2: 26 mmol/L (ref 20–31)
Calcium: 9.3 mg/dL (ref 8.6–10.4)
Chloride: 107 mmol/L (ref 98–110)
Creat: 0.91 mg/dL (ref 0.50–1.05)
GFR, EST NON AFRICAN AMERICAN: 72 mL/min (ref >=60)
GFR, Est African American: 83 mL/min (ref >=60)
GLUCOSE: 92 mg/dL (ref 65–99)
Globulin: 2.3 g/dL (ref 1.9–3.7)
POTASSIUM: 4.4 mmol/L (ref 3.5–5.3)
SODIUM: 139 mmol/L (ref 135–146)
TOTAL PROTEIN: 6.8 g/dL (ref 6.1–8.1)
Total Bilirubin: 0.3 mg/dL (ref 0.2–1.2)

## 2016-06-20 NOTE — Telephone Encounter (Signed)
I have called patient to advise labs are normal  

## 2016-07-16 ENCOUNTER — Other Ambulatory Visit: Payer: Self-pay | Admitting: Rheumatology

## 2016-07-16 NOTE — Telephone Encounter (Signed)
ok 

## 2016-07-16 NOTE — Telephone Encounter (Signed)
Last Visit: 06/19/16 Next Visit due October 2018. Message sent to the front to schedule patient. UDS: 11/06/16 Narc Agreement: 06/19/16  Okay to refill Tramadol?

## 2016-07-17 ENCOUNTER — Telehealth: Payer: Self-pay | Admitting: Rheumatology

## 2016-07-17 NOTE — Telephone Encounter (Signed)
Left message for patient to call back to schedule rov in October.

## 2016-07-17 NOTE — Telephone Encounter (Signed)
-----   Message from Carole Binning, LPN sent at 0/15/8682 12:06 PM EDT ----- Regarding: Please schedule patient a follow up visit Please schedule a follow up visit. Patient will be due October 2018. Thanks!

## 2016-07-24 DIAGNOSIS — R413 Other amnesia: Secondary | ICD-10-CM | POA: Diagnosis not present

## 2016-07-24 DIAGNOSIS — Z8249 Family history of ischemic heart disease and other diseases of the circulatory system: Secondary | ICD-10-CM | POA: Diagnosis not present

## 2016-07-24 DIAGNOSIS — G44219 Episodic tension-type headache, not intractable: Secondary | ICD-10-CM | POA: Diagnosis not present

## 2016-07-26 ENCOUNTER — Telehealth: Payer: Self-pay | Admitting: Pharmacist

## 2016-07-26 DIAGNOSIS — E039 Hypothyroidism, unspecified: Secondary | ICD-10-CM | POA: Diagnosis not present

## 2016-07-26 DIAGNOSIS — R413 Other amnesia: Secondary | ICD-10-CM | POA: Diagnosis not present

## 2016-07-26 NOTE — Telephone Encounter (Signed)
Received a fax from patient's insurance regarding possible drug interaction between duloxetine and tramadol.  Concomitant use of both of these medications may lead to an increased risk of serotonin syndrome.  I reviewed patient's chart and it appears she has been on both medications for an extended period of time.  I called patient to discuss.  I left a voicemail asking patient to call me back.   Elisabeth Most, Pharm.D., BCPS, CPP Clinical Pharmacist Pager: [phone] Phone: [phone] 07/26/2016 2:31 PM

## 2016-07-31 NOTE — Telephone Encounter (Signed)
Patient returning your call.

## 2016-08-01 ENCOUNTER — Other Ambulatory Visit: Payer: Self-pay | Admitting: Neurology

## 2016-08-01 DIAGNOSIS — R6889 Other general symptoms and signs: Secondary | ICD-10-CM

## 2016-08-01 DIAGNOSIS — Z8249 Family history of ischemic heart disease and other diseases of the circulatory system: Secondary | ICD-10-CM

## 2016-08-16 NOTE — Telephone Encounter (Signed)
I spoke to patient regarding her medications.  She confirms she is taking both tramadol and duloxetine and has been on both for a long time.  Reviewed risk of serotonin syndrome and discussed signs and symptoms.  Patient denies any adverse effects from her medications and denies any questions or concerns at this time.   Elisabeth Most, Pharm.D., BCPS, CPP Clinical Pharmacist Pager: [phone] Phone: [phone] 08/16/2016 9:54 AM

## 2016-08-17 ENCOUNTER — Ambulatory Visit
Admission: RE | Admit: 2016-08-17 | Discharge: 2016-08-17 | Disposition: A | Discharge status: Home / Self Care | Payer: Medicare Other | Source: Ambulatory Visit | Attending: Neurology | Admitting: Neurology

## 2016-08-17 DIAGNOSIS — R6889 Other general symptoms and signs: Secondary | ICD-10-CM

## 2016-08-17 DIAGNOSIS — R51 Headache: Secondary | ICD-10-CM | POA: Diagnosis not present

## 2016-08-17 DIAGNOSIS — Z8249 Family history of ischemic heart disease and other diseases of the circulatory system: Secondary | ICD-10-CM

## 2016-09-27 DIAGNOSIS — A440 Systemic bartonellosis: Secondary | ICD-10-CM | POA: Diagnosis not present

## 2016-09-27 DIAGNOSIS — A6920 Lyme disease, unspecified: Secondary | ICD-10-CM | POA: Diagnosis not present

## 2016-09-27 DIAGNOSIS — R799 Abnormal finding of blood chemistry, unspecified: Secondary | ICD-10-CM | POA: Diagnosis not present

## 2016-10-13 ENCOUNTER — Other Ambulatory Visit: Payer: Self-pay | Admitting: Rheumatology

## 2016-10-15 NOTE — Telephone Encounter (Signed)
Last Visit: 06/19/16 Next Visit: 11/27/16 UDS: 11/07/15 Narc Agreement: 06/19/16  Okay to refill Tramadol?

## 2016-10-15 NOTE — Telephone Encounter (Signed)
ok 

## 2016-10-16 DIAGNOSIS — R799 Abnormal finding of blood chemistry, unspecified: Secondary | ICD-10-CM | POA: Diagnosis not present

## 2016-10-16 DIAGNOSIS — R5383 Other fatigue: Secondary | ICD-10-CM | POA: Diagnosis not present

## 2016-10-16 DIAGNOSIS — A6920 Lyme disease, unspecified: Secondary | ICD-10-CM | POA: Diagnosis not present

## 2016-10-31 DIAGNOSIS — R799 Abnormal finding of blood chemistry, unspecified: Secondary | ICD-10-CM | POA: Diagnosis not present

## 2016-10-31 DIAGNOSIS — R5383 Other fatigue: Secondary | ICD-10-CM | POA: Diagnosis not present

## 2016-10-31 DIAGNOSIS — A6920 Lyme disease, unspecified: Secondary | ICD-10-CM | POA: Diagnosis not present

## 2016-11-13 NOTE — Progress Notes (Signed)
Office Visit Note  Patient: [name]             Date of Birth: 1960-09-16           MRN: [id number]             PCP: Maury Dus, MD Referring: Maury Dus, MD Visit Date: 11/27/2016 Occupation: @GUAROCC @    Subjective:  Pain in both shoulders.   History of Present Illness: [name] is a 56 y.o. female history of fibromyalgia, disc disease. She has been taking care of her foster child itches putting extra strain on her back and shoulders. She states her bilateral shoulders have been hurting and she had pain. She's been taking tramadol for the lower back pain. Neck pain is tolerable. She rates her pain from fibromyalgia on the scale of 0-10 about 6-7.  Activities of Daily Living:  Patient reports morning stiffness for 1 hour.   Patient Denies nocturnal pain.  Difficulty dressing/grooming: Denies Difficulty climbing stairs: Denies Difficulty getting out of chair: Denies Difficulty using hands for taps, buttons, cutlery, and/or writing: Denies   Review of Systems  Constitutional: Positive for fatigue. Negative for night sweats, weight gain, weight loss and weakness.  HENT: Negative for mouth sores, trouble swallowing, trouble swallowing, mouth dryness and nose dryness.   Eyes: Negative for pain, redness, visual disturbance and dryness.  Respiratory: Negative for cough, shortness of breath and difficulty breathing.        Asthma  Cardiovascular: Negative for chest pain, palpitations, hypertension, irregular heartbeat and swelling in legs/feet.  Gastrointestinal: Negative for blood in stool, constipation and diarrhea.  Endocrine: Negative for increased urination.  Genitourinary: Negative for vaginal dryness.  Musculoskeletal: Positive for myalgias, morning stiffness and myalgias. Negative for arthralgias, joint pain, joint swelling, muscle weakness and muscle tenderness.  Skin: Negative for color change, rash, hair loss, skin tightness, ulcers and sensitivity  to sunlight.  Allergic/Immunologic: Negative for susceptible to infections.  Neurological: Negative for dizziness, memory loss and night sweats.  Hematological: Negative for swollen glands.  Psychiatric/Behavioral: Positive for depressed mood and sleep disturbance. The patient is not nervous/anxious.     PMFS History:  Patient Active Problem List   Diagnosis Date Noted  . Fibromyalgia 06/05/2016  . Other insomnia 06/05/2016  . Other fatigue 06/05/2016  . DDD (degenerative disc disease), cervical 06/05/2016  . DDD (degenerative disc disease), lumbar 06/05/2016  . Osteopenia of multiple sites 06/05/2016  . History of asthma 06/05/2016  . History of Hashimoto thyroiditis 06/05/2016  . History of anxiety 06/05/2016  . Seasonal affective disorder ([place]) 06/05/2016  . Postmenopausal bleeding 12/02/2012    Class: Present on Admission  . Fibroids, submucosal 12/02/2012    Class: Present on Admission  . Hx of Lyme disease 06/02/2012  . Postural dizziness 08/08/2011  . Hyperparathyroidism, primary ([place]) 01/08/2011    Past Medical History:  Diagnosis Date  . Abnormal uterine bleeding (AUB)   . Allergic rhinitis   . Anxiety   . Asthma   . B12 deficiency   . Bruises easily   . Chronic back pain    oa  . Depression   . Fibromyalgia   . GERD (gastroesophageal reflux disease)    watches diet  . H/O hiatal hernia   . Hashimoto thyroiditis   . History of babesiosis    PARASITITIS RESOLVED  . History of Bell's palsy    2000--  resolved  . History of concussion    age 53--  no residual  .  History of hyperparathyroidism    PRIMARY--  S/P PARATHYROIDECTOMY 01-28-2011  . History of palpitations    stress induced  . History of uterine fibroid   . Hypoglycemia   . Lyme disease    CHRONIC --  FOR 22 YRS (SINCE APPROX 1996)  AND HX CAT SCRATCH DISEASE WHICH HAS RESOLVED  . Musculoskeletal chest pain    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  . Osteoarthritis    all joints secondary to lyme  disease/  back is worse  . Vitamin D deficiency   . Wears contact lenses     Family History  Problem Relation Age of Onset  . Mitral valve prolapse Father   . Hypertension Father   . Dementia Father   . Hypothyroidism Mother   . Colon polyps Brother   . Aneurysm Brother   . Arthritis Brother   . Lupus Sister   . Rheum arthritis Sister   . Osteoarthritis Sister   . Cancer Maternal Aunt        breast   Past Surgical History:  Procedure Laterality Date  . DILATATION & CURRETTAGE/HYSTEROSCOPY WITH RESECTOCOPE N/A 12/02/2012   Procedure: DILATATION & CURETTAGE/HYSTEROSCOPY WITH RESECTOCOPE;  Surgeon: Darlyn Chamber, MD;  Location: [facility];  Service: Gynecology;  Laterality: N/A;  . GANGLION CYST EXCISION  2007   HAND  . HYSTEROSCOPIC MYOMECTOMY AND ABDOMINAL MYOMECTOMY  2006  . PARATHYROIDECTOMY  01/28/2011   Procedure: PARATHYROIDECTOMY;  Surgeon: Earnstine Regal, MD;  Location: [facility];  Service: General;  Laterality: N/A;   . STRESS ECHO REPORT  09-01-2009  DR [place]   NORMAL LVF/  EF 60%/  NORMAL HYPERDYNAMIC RESPONSE  . TILT TABLE STUDY  08-21-2011    DR KLEIN   BP WERE CONSISTENT 110-117 THROUGH OUT STUDY/ BASELINE HR 68-70  . TILT TABLE STUDY N/A 08/21/2011   Procedure: TILT TABLE STUDY;  Surgeon: Deboraha Sprang, MD;  Location: [facility] C[facility];  Service: Cardiovascular;  Laterality: N/A;  . TONSILLECTOMY  age 42   Social History   Social History Narrative  . No narrative on file     Objective: Vital Signs: BP 133/89 (BP Location: Left Arm, Patient Position: Sitting, Cuff Size: Normal)   Pulse (!) 101   Resp 14   Ht 5\' 4"  (1.626 m)   Wt 197 lb (89.4 kg)   LMP 01/22/2011   BMI 33.81 kg/m    Physical Exam  Constitutional: She is oriented to person, place, and time. She appears well-developed and well-nourished.  HENT:  Head: Normocephalic and atraumatic.  Eyes: Conjunctivae and EOM are normal.  Neck: Normal range of motion.  Cardiovascular: Normal  rate, regular rhythm, normal heart sounds and intact distal pulses.   Pulmonary/Chest: Effort normal and breath sounds normal.  Abdominal: Soft. Bowel sounds are normal.  Lymphadenopathy:    She has no cervical adenopathy.  Neurological: She is alert and oriented to person, place, and time.  Skin: Skin is warm and dry. Capillary refill takes less than 2 seconds.  Psychiatric: She has a normal mood and affect. Her behavior is normal.  Nursing note and vitals reviewed.    Musculoskeletal Exam: C-spine good range of motion. Thoracic and lumbar spine limited range of motion with some discomfort. She had bilateral trapezius is spasm. Shoulder joints elbow joints wrist joint MCPs PIPs DIPs with good range of motion with no synovitis. Hip joints knee joints ankles MTPs PIPs DIPs with good range of motion with no synovitis.  CDAI Exam:  No CDAI exam completed.    Investigation: No additional findings.   Imaging: No results found.  Speciality Comments: No specialty comments available.    Procedures:  No procedures performed Allergies: Contrast media [iodinated diagnostic agents]; Flagyl [metronidazole hcl]; and Tinidazole   Assessment / Plan:     Visit Diagnoses: Fibromyalgia - patient continues to have some generalized pain and discomfort. She has bilateral trapezius spasm. She states Cymbalta is not as effective anymore. She will be switching to some other medication for her PCP. Cymbalta 60 mg po qd, tizanidine 4 mg by mouth daily at bedtime  Other insomnia: She states trazodone as effective to some extent.  Other fatigue: Secondary to chronic insomnia.  Trochanteric bursitis, right hip: ITB and exercise were demonstrated.  DDD (degenerative disc disease), cervical: Doing fairly well. She had bilateral trapezius spasm. A handout on neck exercises was given.  DDD (degenerative disc disease), lumbar: She's been having increased lower back pain. I've given her a handout on back  exercises.  Other chronic pain - on tramadol 50 mg 1-2 tablets by mouth twice a day. Narc agreement 05/2016. I will check UDS today.  Osteopenia of multiple sites: Use of calcium and vitamin D was discussed.  Her other medical problems are listed as follows:  History of asthma  History of Hashimoto thyroiditis  History of anxiety  History of hyperparathyroidism  Medication management - Plan: Pain Mgmt, Profile 5 w/Conf, U, Pain Mgmt, Tramadol w/medMATCH, U    Orders: Orders Placed This Encounter  Procedures  . Pain Mgmt, Profile 5 w/Conf, U  . Pain Mgmt, Tramadol w/medMATCH, U   Meds ordered this encounter  Medications  . traMADol (ULTRAM) 50 MG tablet    Sig: TAKE 1 TO 2 TABLETS BY MOUTH TWICE A DAY AS NEEDED    Dispense:  120 tablet    Refill:  0    Not to exceed 5 additional fills before 11/18/2018PATIENT IS OUT OF REFILLS FOR THIS MEDICATION.    Face-to-face time spent with patient was 30 minutes. Greater than 50% of time was spent in counseling and coordination of care.  Follow-Up Instructions: Return in about 6 months (around 05/28/2017) for FMS DDD.   Bo Merino, MD  Note - This record has been created using Editor, commissioning.  Chart creation errors have been sought, but may not always  have been located. Such creation errors do not reflect on  the standard of medical care.

## 2016-11-18 DIAGNOSIS — R799 Abnormal finding of blood chemistry, unspecified: Secondary | ICD-10-CM | POA: Diagnosis not present

## 2016-11-18 DIAGNOSIS — A6920 Lyme disease, unspecified: Secondary | ICD-10-CM | POA: Diagnosis not present

## 2016-11-18 DIAGNOSIS — R5383 Other fatigue: Secondary | ICD-10-CM | POA: Diagnosis not present

## 2016-11-27 ENCOUNTER — Encounter: Payer: Self-pay | Admitting: Rheumatology

## 2016-11-27 ENCOUNTER — Ambulatory Visit (INDEPENDENT_AMBULATORY_CARE_PROVIDER_SITE_OTHER): Payer: 59 | Admitting: Rheumatology

## 2016-11-27 VITALS — BP 133/89 | HR 101 | Resp 14 | Ht 64.0 in | Wt 197.0 lb

## 2016-11-27 DIAGNOSIS — M5136 Other intervertebral disc degeneration, lumbar region: Secondary | ICD-10-CM

## 2016-11-27 DIAGNOSIS — M51369 Other intervertebral disc degeneration, lumbar region without mention of lumbar back pain or lower extremity pain: Secondary | ICD-10-CM

## 2016-11-27 DIAGNOSIS — M5030 Other cervical disc degeneration, unspecified cervical region: Secondary | ICD-10-CM | POA: Diagnosis not present

## 2016-11-27 DIAGNOSIS — Z79899 Other long term (current) drug therapy: Secondary | ICD-10-CM | POA: Diagnosis not present

## 2016-11-27 DIAGNOSIS — Z8659 Personal history of other mental and behavioral disorders: Secondary | ICD-10-CM | POA: Diagnosis not present

## 2016-11-27 DIAGNOSIS — G4709 Other insomnia: Secondary | ICD-10-CM

## 2016-11-27 DIAGNOSIS — Z8709 Personal history of other diseases of the respiratory system: Secondary | ICD-10-CM

## 2016-11-27 DIAGNOSIS — M8589 Other specified disorders of bone density and structure, multiple sites: Secondary | ICD-10-CM | POA: Diagnosis not present

## 2016-11-27 DIAGNOSIS — Z8639 Personal history of other endocrine, nutritional and metabolic disease: Secondary | ICD-10-CM | POA: Diagnosis not present

## 2016-11-27 DIAGNOSIS — G8929 Other chronic pain: Secondary | ICD-10-CM

## 2016-11-27 DIAGNOSIS — M797 Fibromyalgia: Secondary | ICD-10-CM | POA: Diagnosis not present

## 2016-11-27 DIAGNOSIS — R5383 Other fatigue: Secondary | ICD-10-CM | POA: Diagnosis not present

## 2016-11-27 MED ORDER — TRAMADOL HCL 50 MG PO TABS: ORAL_TABLET | ORAL | 0 refills | Status: DC

## 2016-11-27 NOTE — Patient Instructions (Signed)
Cervical Strain and Sprain Rehab Ask your health care provider which exercises are safe for you. Do exercises exactly as told by your health care provider and adjust them as directed. It is normal to feel mild stretching, pulling, tightness, or discomfort as you do these exercises, but you should stop right away if you feel sudden pain or your pain gets worse.Do not begin these exercises until told by your health care provider. Stretching and range of motion exercises These exercises warm up your muscles and joints and improve the movement and flexibility of your neck. These exercises also help to relieve pain, numbness, and tingling. Exercise A: Cervical side bend  1. Using good posture, sit on a stable chair or stand up. 2. Without moving your shoulders, slowly tilt your left / right ear to your shoulder until you feel a stretch in your neck muscles. You should be looking straight ahead. 3. Hold for __________ seconds. 4. Repeat with the other side of your neck. Repeat __________ times. Complete this exercise __________ times a day. Exercise B: Cervical rotation  1. Using good posture, sit on a stable chair or stand up. 2. Slowly turn your head to the side as if you are looking over your left / right shoulder. ? Keep your eyes level with the ground. ? Stop when you feel a stretch along the side and the back of your neck. 3. Hold for __________ seconds. 4. Repeat this by turning to your other side. Repeat __________ times. Complete this exercise __________ times a day. Exercise C: Thoracic extension and pectoral stretch 1. Roll a towel or a small blanket so it is about 4 inches (10 cm) in diameter. 2. Lie down on your back on a firm surface. 3. Put the towel lengthwise, under your spine in the middle of your back. It should not be not under your shoulder blades. The towel should line up with your spine from your middle back to your lower back. 4. Put your hands behind your head and let your  elbows fall out to your sides. 5. Hold for __________ seconds. Repeat __________ times. Complete this exercise __________ times a day. Strengthening exercises These exercises build strength and endurance in your neck. Endurance is the ability to use your muscles for a long time, even after your muscles get tired. Exercise D: Upper cervical flexion, isometric 1. Lie on your back with a thin pillow behind your head and a small rolled-up towel under your neck. 2. Gently tuck your chin toward your chest and nod your head down to look toward your feet. Do not lift your head off the pillow. 3. Hold for __________ seconds. 4. Release the tension slowly. Relax your neck muscles completely before you repeat this exercise. Repeat __________ times. Complete this exercise __________ times a day. Exercise E: Cervical extension, isometric  1. Stand about 6 inches (15 cm) away from a wall, with your back facing the wall. 2. Place a soft object, about 6-8 inches (15-20 cm) in diameter, between the back of your head and the wall. A soft object could be a small pillow, a ball, or a folded towel. 3. Gently tilt your head back and press into the soft object. Keep your jaw and forehead relaxed. 4. Hold for __________ seconds. 5. Release the tension slowly. Relax your neck muscles completely before you repeat this exercise. Repeat __________ times. Complete this exercise __________ times a day. Posture and body mechanics  Body mechanics refers to the movements and positions of   your body while you do your daily activities. Posture is part of body mechanics. Good posture and healthy body mechanics can help to relieve stress in your body's tissues and joints. Good posture means that your spine is in its natural S-curve position (your spine is neutral), your shoulders are pulled back slightly, and your head is not tipped forward. The following are general guidelines for applying improved posture and body mechanics to  your everyday activities. Standing  When standing, keep your spine neutral and keep your feet about hip-width apart. Keep a slight bend in your knees. Your ears, shoulders, and hips should line up.  When you do a task in which you stand in one place for a long time, place one foot up on a stable object that is 2-4 inches (5-10 cm) high, such as a footstool. This helps keep your spine neutral. Sitting   When sitting, keep your spine neutral and your keep feet flat on the floor. Use a footrest, if necessary, and keep your thighs parallel to the floor. Avoid rounding your shoulders, and avoid tilting your head forward.  When working at a desk or a computer, keep your desk at a height where your hands are slightly lower than your elbows. Slide your chair under your desk so you are close enough to maintain good posture.  When working at a computer, place your monitor at a height where you are looking straight ahead and you do not have to tilt your head forward or downward to look at the screen. Resting When lying down and resting, avoid positions that are most painful for you. Try to support your neck in a neutral position. You can use a contour pillow or a small rolled-up towel. Your pillow should support your neck but not push on it. This information is not intended to replace advice given to you by your health care provider. Make sure you discuss any questions you have with your health care provider. Document Released: 02/11/2005 Document Revised: 10/19/2015 Document Reviewed: 01/18/2015 Elsevier Interactive Patient Education  2018 [employer] Back Exercises The following exercises strengthen the muscles that help to support the back. They also help to keep the lower back flexible. Doing these exercises can help to prevent back pain or lessen existing pain. If you have back pain or discomfort, try doing these exercises 2-3 times each day or as told by your health care provider. When the pain  goes away, do them once each day, but increase the number of times that you repeat the steps for each exercise (do more repetitions). If you do not have back pain or discomfort, do these exercises once each day or as told by your health care provider. Exercises Single Knee to Chest  Repeat these steps 3-5 times for each leg: 5. Lie on your back on a firm bed or the floor with your legs extended. 6. Bring one knee to your chest. Your other leg should stay extended and in contact with the floor. 7. Hold your knee in place by grabbing your knee or thigh. 8. Pull on your knee until you feel a gentle stretch in your lower back. 9. Hold the stretch for 10-30 seconds. 10. Slowly release and straighten your leg.  Pelvic Tilt  Repeat these steps 5-10 times: 1. Lie on your back on a firm bed or the floor with your legs extended. 2. Bend your knees so they are pointing toward the ceiling and your feet are flat on the floor. 3. Tighten   your lower abdominal muscles to press your lower back against the floor. This motion will tilt your pelvis so your tailbone points up toward the ceiling instead of pointing to your feet or the floor. 4. With gentle tension and even breathing, hold this position for 5-10 seconds.  Cat-Cow  Repeat these steps until your lower back becomes more flexible: 6. Get into a hands-and-knees position on a firm surface. Keep your hands under your shoulders, and keep your knees under your hips. You may place padding under your knees for comfort. 7. Let your head hang down, and point your tailbone toward the floor so your lower back becomes rounded like the back of a cat. 8. Hold this position for 5 seconds. 9. Slowly lift your head and point your tailbone up toward the ceiling so your back forms a sagging arch like the back of a cow. 10. Hold this position for 5 seconds.  Press-Ups  Repeat these steps 5-10 times: 5. Lie on your abdomen (face-down) on the floor. 6. Place your  palms near your head, about shoulder-width apart. 7. While you keep your back as relaxed as possible and keep your hips on the floor, slowly straighten your arms to raise the top half of your body and lift your shoulders. Do not use your back muscles to raise your upper torso. You may adjust the placement of your hands to make yourself more comfortable. 8. Hold this position for 5 seconds while you keep your back relaxed. 9. Slowly return to lying flat on the floor.  Bridges  Repeat these steps 10 times: 6. Lie on your back on a firm surface. 7. Bend your knees so they are pointing toward the ceiling and your feet are flat on the floor. 8. Tighten your buttocks muscles and lift your buttocks off of the floor until your waist is at almost the same height as your knees. You should feel the muscles working in your buttocks and the back of your thighs. If you do not feel these muscles, slide your feet 1-2 inches farther away from your buttocks. 9. Hold this position for 3-5 seconds. 10. Slowly lower your hips to the starting position, and allow your buttocks muscles to relax completely.  If this exercise is too easy, try doing it with your arms crossed over your chest. Abdominal Crunches  Repeat these steps 5-10 times: 1. Lie on your back on a firm bed or the floor with your legs extended. 2. Bend your knees so they are pointing toward the ceiling and your feet are flat on the floor. 3. Cross your arms over your chest. 4. Tip your chin slightly toward your chest without bending your neck. 5. Tighten your abdominal muscles and slowly raise your trunk (torso) high enough to lift your shoulder blades a tiny bit off of the floor. Avoid raising your torso higher than that, because it can put too much stress on your low back and it does not help to strengthen your abdominal muscles. 6. Slowly return to your starting position.  Back Lifts Repeat these steps 5-10 times: 1. Lie on your abdomen  (face-down) with your arms at your sides, and rest your forehead on the floor. 2. Tighten the muscles in your legs and your buttocks. 3. Slowly lift your chest off of the floor while you keep your hips pressed to the floor. Keep the back of your head in line with the curve in your back. Your eyes should be looking at the floor. 4.   Hold this position for 3-5 seconds. 5. Slowly return to your starting position.  Contact a health care provider if:  Your back pain or discomfort gets much worse when you do an exercise.  Your back pain or discomfort does not lessen within 2 hours after you exercise. If you have any of these problems, stop doing these exercises right away. Do not do them again unless your health care provider says that you can. Get help right away if:  You develop sudden, severe back pain. If this happens, stop doing the exercises right away. Do not do them again unless your health care provider says that you can. This information is not intended to replace advice given to you by your health care provider. Make sure you discuss any questions you have with your health care provider. Document Released: 03/21/2004 Document Revised: 06/21/2015 Document Reviewed: 04/07/2014 Elsevier Interactive Patient Education  2017 Elsevier Inc[employer]  

## 2016-11-30 LAB — PAIN MGMT, PROFILE 5 W/CONF, U
ALPHAHYDROXYALPRAZOLAM: NEGATIVE ng/mL (ref <25)
ALPHAHYDROXYTRIAZOLAM: NEGATIVE ng/mL (ref <50)
AMINOCLONAZEPAM: NEGATIVE ng/mL (ref <25)
Alphahydroxymidazolam: NEGATIVE ng/mL (ref <50)
Amphetamines: NEGATIVE ng/mL (ref <500)
BARBITURATES: NEGATIVE ng/mL (ref <300)
Benzodiazepines: NEGATIVE ng/mL (ref <100)
COCAINE METABOLITE: NEGATIVE ng/mL (ref <150)
Codeine: NEGATIVE ng/mL (ref <50)
Creatinine: 259.7 mg/dL
HYDROMORPHONE: NEGATIVE ng/mL (ref <50)
Hydrocodone: NEGATIVE ng/mL (ref <50)
Hydroxyethylflurazepam: NEGATIVE ng/mL (ref <50)
Lorazepam: NEGATIVE ng/mL (ref <50)
METHADONE METABOLITE: NEGATIVE ng/mL (ref <100)
MORPHINE: NEGATIVE ng/mL (ref <50)
Marijuana Metabolite: NEGATIVE ng/mL (ref <20)
Nordiazepam: NEGATIVE ng/mL (ref <50)
Norhydrocodone: NEGATIVE ng/mL (ref <50)
OXIDANT: NEGATIVE ug/mL (ref <200)
Opiates: NEGATIVE ng/mL (ref <100)
Oxazepam: NEGATIVE ng/mL (ref <50)
Oxycodone: NEGATIVE ng/mL (ref <100)
PH: 5.86 (ref 4.5–9.0)
Temazepam: NEGATIVE ng/mL (ref <50)

## 2016-11-30 LAB — PAIN MGMT, TRAMADOL W/MEDMATCH, U
DESMETHYLTRAMADOL: 2220 ng/mL — AB (ref <100)
Tramadol: 12192 ng/mL — ABNORMAL HIGH (ref <100)

## 2016-11-30 NOTE — Progress Notes (Signed)
C/w

## 2016-12-11 DIAGNOSIS — R799 Abnormal finding of blood chemistry, unspecified: Secondary | ICD-10-CM | POA: Diagnosis not present

## 2016-12-11 DIAGNOSIS — A440 Systemic bartonellosis: Secondary | ICD-10-CM | POA: Diagnosis not present

## 2016-12-11 DIAGNOSIS — A6920 Lyme disease, unspecified: Secondary | ICD-10-CM | POA: Diagnosis not present

## 2016-12-25 DIAGNOSIS — R799 Abnormal finding of blood chemistry, unspecified: Secondary | ICD-10-CM | POA: Diagnosis not present

## 2016-12-25 DIAGNOSIS — A6920 Lyme disease, unspecified: Secondary | ICD-10-CM | POA: Diagnosis not present

## 2016-12-25 DIAGNOSIS — A440 Systemic bartonellosis: Secondary | ICD-10-CM | POA: Diagnosis not present

## 2017-01-13 DIAGNOSIS — D225 Melanocytic nevi of trunk: Secondary | ICD-10-CM | POA: Diagnosis not present

## 2017-01-13 DIAGNOSIS — D2262 Melanocytic nevi of left upper limb, including shoulder: Secondary | ICD-10-CM | POA: Diagnosis not present

## 2017-01-13 DIAGNOSIS — D2261 Melanocytic nevi of right upper limb, including shoulder: Secondary | ICD-10-CM | POA: Diagnosis not present

## 2017-01-13 DIAGNOSIS — B078 Other viral warts: Secondary | ICD-10-CM | POA: Diagnosis not present

## 2017-01-14 DIAGNOSIS — R799 Abnormal finding of blood chemistry, unspecified: Secondary | ICD-10-CM | POA: Diagnosis not present

## 2017-01-14 DIAGNOSIS — A440 Systemic bartonellosis: Secondary | ICD-10-CM | POA: Diagnosis not present

## 2017-01-14 DIAGNOSIS — A6920 Lyme disease, unspecified: Secondary | ICD-10-CM | POA: Diagnosis not present

## 2017-01-24 DIAGNOSIS — A6920 Lyme disease, unspecified: Secondary | ICD-10-CM | POA: Diagnosis not present

## 2017-01-24 DIAGNOSIS — Z79899 Other long term (current) drug therapy: Secondary | ICD-10-CM | POA: Diagnosis not present

## 2017-01-24 DIAGNOSIS — R7989 Other specified abnormal findings of blood chemistry: Secondary | ICD-10-CM | POA: Diagnosis not present

## 2017-01-31 DIAGNOSIS — R7989 Other specified abnormal findings of blood chemistry: Secondary | ICD-10-CM | POA: Diagnosis not present

## 2017-01-31 DIAGNOSIS — Z79899 Other long term (current) drug therapy: Secondary | ICD-10-CM | POA: Diagnosis not present

## 2017-01-31 DIAGNOSIS — A6920 Lyme disease, unspecified: Secondary | ICD-10-CM | POA: Diagnosis not present

## 2017-02-03 ENCOUNTER — Other Ambulatory Visit: Payer: Self-pay | Admitting: Rheumatology

## 2017-02-05 NOTE — Telephone Encounter (Signed)
ok 

## 2017-02-05 NOTE — Telephone Encounter (Signed)
Last Visit: 11/27/16 Next Visit due April 2019. Message sent to the front to schedule patient.  UDS: 11/27/16 Narc Agreement: 06/19/16  Okay to refill tramadol?

## 2017-02-07 DIAGNOSIS — Z79899 Other long term (current) drug therapy: Secondary | ICD-10-CM | POA: Diagnosis not present

## 2017-02-07 DIAGNOSIS — A6920 Lyme disease, unspecified: Secondary | ICD-10-CM | POA: Diagnosis not present

## 2017-02-07 DIAGNOSIS — R7989 Other specified abnormal findings of blood chemistry: Secondary | ICD-10-CM | POA: Diagnosis not present

## 2017-02-14 DIAGNOSIS — Z79899 Other long term (current) drug therapy: Secondary | ICD-10-CM | POA: Diagnosis not present

## 2017-02-14 DIAGNOSIS — A6920 Lyme disease, unspecified: Secondary | ICD-10-CM | POA: Diagnosis not present

## 2017-02-14 DIAGNOSIS — R7989 Other specified abnormal findings of blood chemistry: Secondary | ICD-10-CM | POA: Diagnosis not present

## 2017-02-19 DIAGNOSIS — A6920 Lyme disease, unspecified: Secondary | ICD-10-CM | POA: Diagnosis not present

## 2017-02-19 DIAGNOSIS — R7989 Other specified abnormal findings of blood chemistry: Secondary | ICD-10-CM | POA: Diagnosis not present

## 2017-02-19 DIAGNOSIS — Z79899 Other long term (current) drug therapy: Secondary | ICD-10-CM | POA: Diagnosis not present

## 2017-02-28 DIAGNOSIS — R7989 Other specified abnormal findings of blood chemistry: Secondary | ICD-10-CM | POA: Diagnosis not present

## 2017-02-28 DIAGNOSIS — Z79899 Other long term (current) drug therapy: Secondary | ICD-10-CM | POA: Diagnosis not present

## 2017-02-28 DIAGNOSIS — A6920 Lyme disease, unspecified: Secondary | ICD-10-CM | POA: Diagnosis not present

## 2017-03-05 ENCOUNTER — Other Ambulatory Visit: Payer: Self-pay | Admitting: Obstetrics and Gynecology

## 2017-03-05 DIAGNOSIS — Z1231 Encounter for screening mammogram for malignant neoplasm of breast: Secondary | ICD-10-CM

## 2017-03-05 DIAGNOSIS — Z79899 Other long term (current) drug therapy: Secondary | ICD-10-CM | POA: Diagnosis not present

## 2017-03-05 DIAGNOSIS — R7989 Other specified abnormal findings of blood chemistry: Secondary | ICD-10-CM | POA: Diagnosis not present

## 2017-03-05 DIAGNOSIS — A6920 Lyme disease, unspecified: Secondary | ICD-10-CM | POA: Diagnosis not present

## 2017-03-19 ENCOUNTER — Telehealth: Payer: Self-pay | Admitting: Rheumatology

## 2017-03-19 NOTE — Telephone Encounter (Signed)
Reviewed patient's chart in Epic and [name]. We have not prescribed Gabapentin for this patient in the past. Attempted to contact the patient and left message for patient to call the office.

## 2017-03-19 NOTE — Telephone Encounter (Signed)
Patient called to request a prescription refill for her Gabapentin.  Patient uses [facility] on [address].  Patient's CB# [phone]

## 2017-03-24 ENCOUNTER — Telehealth: Payer: Self-pay | Admitting: Rheumatology

## 2017-03-24 NOTE — Telephone Encounter (Signed)
Patient left a voicemail returning your call.  Her CB# [phone]

## 2017-03-25 NOTE — Telephone Encounter (Signed)
Attempted to contact the patient and left message for patient to call the office.  

## 2017-03-26 ENCOUNTER — Telehealth: Payer: Self-pay | Admitting: Rheumatology

## 2017-03-26 NOTE — Telephone Encounter (Signed)
Patient advised we have never prescribed this medication for her. Patient states she will contact her other physician.

## 2017-03-26 NOTE — Telephone Encounter (Signed)
Patient returning your call.

## 2017-03-28 DIAGNOSIS — Z79899 Other long term (current) drug therapy: Secondary | ICD-10-CM | POA: Diagnosis not present

## 2017-03-28 DIAGNOSIS — A6920 Lyme disease, unspecified: Secondary | ICD-10-CM | POA: Diagnosis not present

## 2017-03-28 DIAGNOSIS — R7989 Other specified abnormal findings of blood chemistry: Secondary | ICD-10-CM | POA: Diagnosis not present

## 2017-04-04 ENCOUNTER — Ambulatory Visit
Admission: RE | Admit: 2017-04-04 | Discharge: 2017-04-04 | Disposition: A | Discharge status: Home / Self Care | Payer: 59 | Source: Ambulatory Visit | Attending: Obstetrics and Gynecology | Admitting: Obstetrics and Gynecology

## 2017-04-04 DIAGNOSIS — Z1231 Encounter for screening mammogram for malignant neoplasm of breast: Secondary | ICD-10-CM | POA: Diagnosis not present

## 2017-04-10 ENCOUNTER — Other Ambulatory Visit: Payer: Self-pay | Admitting: Rheumatology

## 2017-04-11 NOTE — Telephone Encounter (Addendum)
Last Visit: 11/27/16 Next Visit: 05/28/17  UDS: 11/27/16 Narc Agreement: 06/19/16  Okay to refill tramadol?

## 2017-04-22 DIAGNOSIS — A6920 Lyme disease, unspecified: Secondary | ICD-10-CM | POA: Diagnosis not present

## 2017-04-22 DIAGNOSIS — Z79899 Other long term (current) drug therapy: Secondary | ICD-10-CM | POA: Diagnosis not present

## 2017-04-22 DIAGNOSIS — R7989 Other specified abnormal findings of blood chemistry: Secondary | ICD-10-CM | POA: Diagnosis not present

## 2017-05-14 NOTE — Progress Notes (Deleted)
Office Visit Note  Patient: [name]             Date of Birth: April 09, 1960           MRN: [id number]             PCP: Maury Dus, MD Referring: Maury Dus, MD Visit Date: 05/28/2017 Occupation: @GUAROCC @    Subjective:  No chief complaint on file.   History of Present Illness: [name] is a 57 y.o. female ***   Activities of Daily Living:  Patient reports morning stiffness for *** {minute/hour:19697}.   Patient {ACTIONS;DENIES/REPORTS:21021675::"Denies"} nocturnal pain.  Difficulty dressing/grooming: {ACTIONS;DENIES/REPORTS:21021675::"Denies"} Difficulty climbing stairs: {ACTIONS;DENIES/REPORTS:21021675::"Denies"} Difficulty getting out of chair: {ACTIONS;DENIES/REPORTS:21021675::"Denies"} Difficulty using hands for taps, buttons, cutlery, and/or writing: {ACTIONS;DENIES/REPORTS:21021675::"Denies"}   No Rheumatology ROS completed.   PMFS History:  Patient Active Problem List   Diagnosis Date Noted  . Fibromyalgia 06/05/2016  . Other insomnia 06/05/2016  . Other fatigue 06/05/2016  . DDD (degenerative disc disease), cervical 06/05/2016  . DDD (degenerative disc disease), lumbar 06/05/2016  . Osteopenia of multiple sites 06/05/2016  . History of asthma 06/05/2016  . History of Hashimoto thyroiditis 06/05/2016  . History of anxiety 06/05/2016  . Seasonal affective disorder ([place]) 06/05/2016  . Postmenopausal bleeding 12/02/2012    Class: Present on Admission  . Fibroids, submucosal 12/02/2012    Class: Present on Admission  . Hx of Lyme disease 06/02/2012  . Postural dizziness 08/08/2011  . Hyperparathyroidism, primary ([place]) 01/08/2011    Past Medical History:  Diagnosis Date  . Abnormal uterine bleeding (AUB)   . Allergic rhinitis   . Anxiety   . Asthma   . B12 deficiency   . Bruises easily   . Chronic back pain    oa  . Depression   . Fibromyalgia   . GERD (gastroesophageal reflux disease)    watches diet  . H/O hiatal hernia     . Hashimoto thyroiditis   . History of babesiosis    PARASITITIS RESOLVED  . History of Bell's palsy    2000--  resolved  . History of concussion    age 68--  no residual  . History of hyperparathyroidism    PRIMARY--  S/P PARATHYROIDECTOMY 01-28-2011  . History of palpitations    stress induced  . History of uterine fibroid   . Hypoglycemia   . Lyme disease    CHRONIC --  FOR 22 YRS (SINCE APPROX 1996)  AND HX CAT SCRATCH DISEASE WHICH HAS RESOLVED  . Musculoskeletal chest pain    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  . Osteoarthritis    all joints secondary to lyme disease/  back is worse  . Vitamin D deficiency   . Wears contact lenses     Family History  Problem Relation Age of Onset  . Mitral valve prolapse Father   . Hypertension Father   . Dementia Father   . Hypothyroidism Mother   . Colon polyps Brother   . Aneurysm Brother   . Arthritis Brother   . Lupus Sister   . Rheum arthritis Sister   . Osteoarthritis Sister   . Cancer Maternal Aunt        breast   Past Surgical History:  Procedure Laterality Date  . DILATATION & CURRETTAGE/HYSTEROSCOPY WITH RESECTOCOPE N/A 12/02/2012   Procedure: DILATATION & CURETTAGE/HYSTEROSCOPY WITH RESECTOCOPE;  Surgeon: Darlyn Chamber, MD;  Location: [facility];  Service: Gynecology;  Laterality: N/A;  . GANGLION CYST EXCISION  2007  HAND  . HYSTEROSCOPIC MYOMECTOMY AND ABDOMINAL MYOMECTOMY  2006  . PARATHYROIDECTOMY  01/28/2011   Procedure: PARATHYROIDECTOMY;  Surgeon: Earnstine Regal, MD;  Location: [facility];  Service: General;  Laterality: N/A;   . STRESS ECHO REPORT  09-01-2009  DR [place]   NORMAL LVF/  EF 60%/  NORMAL HYPERDYNAMIC RESPONSE  . TILT TABLE STUDY  08-21-2011    DR KLEIN   BP WERE CONSISTENT 110-117 THROUGH OUT STUDY/ BASELINE HR 68-70  . TILT TABLE STUDY N/A 08/21/2011   Procedure: TILT TABLE STUDY;  Surgeon: Deboraha Sprang, MD;  Location: [facility] CATH LAB;  Service: Cardiovascular;  Laterality: N/A;  .  TONSILLECTOMY  age 19   Social History   Social History Narrative  . Not on file     Objective: Vital Signs: LMP 01/22/2011    Physical Exam   Musculoskeletal Exam: ***  CDAI Exam: No CDAI exam completed.    Investigation: No additional findings. CBC Latest Ref Rng & Units 06/19/2016 02/01/2015 12/02/2012  WBC 3.8 - 10.8 K/uL 4.9 4.2 4.4  Hemoglobin 11.7 - 15.5 g/dL 14.3 10.5(L) 13.7  Hematocrit 35.0 - 45.0 % 42.7 31.3(L) 39.1  Platelets 140 - 400 K/uL 185 104(L) 162   CMP Latest Ref Rng & Units 06/19/2016 02/01/2015 08/13/2011  Glucose 65 - 99 mg/dL 92 112(H) -  BUN 7 - 25 mg/dL 15 14 -  Creatinine 0.50 - 1.05 mg/dL 0.91 0.99 -  Sodium 135 - 146 mmol/L 139 143 -  Potassium 3.5 - 5.3 mmol/L 4.4 3.8 -  Chloride 98 - 110 mmol/L 107 109 -  CO2 20 - 31 mmol/L 26 28 -  Calcium 8.6 - 10.4 mg/dL 9.3 9.4 9.3  Total Protein 6.1 - 8.1 g/dL 6.8 6.6 -  Total Bilirubin 0.2 - 1.2 mg/dL 0.3 0.5 -  Alkaline Phos 33 - 130 U/L 68 59 -  AST 10 - 35 U/L 22 25 -  ALT 6 - 29 U/L 26 26 -    Imaging: No results found.  Speciality Comments: No specialty comments available.    Procedures:  No procedures performed Allergies: Contrast media [iodinated diagnostic agents]; Flagyl [metronidazole hcl]; and Tinidazole   Assessment / Plan:     Visit Diagnoses: Fibromyalgia - Zanaflex 4 mg po daily at bedtimeCymbalta 60 mg daily  Other insomnia - On Trazodone   Other fatigue  Trochanteric bursitis, right hip  DDD (degenerative disc disease), cervical  DDD (degenerative disc disease), lumbar  Other chronic pain  Medication monitoring encounter - Tramadol 50 mg 1-2 tablets by mouth twice dailyNarcotic agreement 4/2018UDS:  Osteopenia of multiple sites  History of asthma  History of Hashimoto thyroiditis  History of anxiety  History of hyperparathyroidism    Orders: No orders of the defined types were placed in this encounter.  No orders of the defined types were placed in  this encounter.   Face-to-face time spent with patient was *** minutes. 50% of time was spent in counseling and coordination of care.  Follow-Up Instructions: No Follow-up on file.   Ofilia Neas, PA-C  Note - This record has been created using Dragon software.  Chart creation errors have been sought, but may not always  have been located. Such creation errors do not reflect on  the standard of medical care.

## 2017-05-22 DIAGNOSIS — H43813 Vitreous degeneration, bilateral: Secondary | ICD-10-CM | POA: Diagnosis not present

## 2017-05-22 DIAGNOSIS — H33322 Round hole, left eye: Secondary | ICD-10-CM | POA: Diagnosis not present

## 2017-05-22 DIAGNOSIS — H35413 Lattice degeneration of retina, bilateral: Secondary | ICD-10-CM | POA: Diagnosis not present

## 2017-05-28 ENCOUNTER — Ambulatory Visit: Payer: 59 | Admitting: Physician Assistant

## 2017-06-06 DIAGNOSIS — E039 Hypothyroidism, unspecified: Secondary | ICD-10-CM | POA: Diagnosis not present

## 2017-06-06 DIAGNOSIS — R945 Abnormal results of liver function studies: Secondary | ICD-10-CM | POA: Diagnosis not present

## 2017-06-12 ENCOUNTER — Other Ambulatory Visit: Payer: Self-pay | Admitting: *Deleted

## 2017-06-12 MED ORDER — TRAMADOL HCL 50 MG PO TABS: ORAL_TABLET | ORAL | 0 refills | Status: DC

## 2017-06-12 NOTE — Telephone Encounter (Signed)
Refill request received via fax  Last Visit: 11/27/16 Next Visit: 08/01/17  UDS: 11/27/16 Narc Agreement: 06/19/16  Okay to refill tramadol?

## 2017-06-16 ENCOUNTER — Other Ambulatory Visit (HOSPITAL_COMMUNITY): Payer: Self-pay | Admitting: Internal Medicine

## 2017-06-16 DIAGNOSIS — R947 Abnormal results of other endocrine function studies: Secondary | ICD-10-CM

## 2017-06-16 DIAGNOSIS — K760 Fatty (change of) liver, not elsewhere classified: Secondary | ICD-10-CM

## 2017-06-26 ENCOUNTER — Ambulatory Visit (HOSPITAL_COMMUNITY)
Admission: RE | Admit: 2017-06-26 | Discharge: 2017-06-26 | Disposition: A | Discharge status: Home / Self Care | Payer: 59 | Source: Ambulatory Visit | Attending: Internal Medicine | Admitting: Internal Medicine

## 2017-06-26 DIAGNOSIS — K760 Fatty (change of) liver, not elsewhere classified: Secondary | ICD-10-CM | POA: Diagnosis not present

## 2017-06-26 DIAGNOSIS — R947 Abnormal results of other endocrine function studies: Secondary | ICD-10-CM

## 2017-07-17 DIAGNOSIS — Z01419 Encounter for gynecological examination (general) (routine) without abnormal findings: Secondary | ICD-10-CM | POA: Diagnosis not present

## 2017-07-17 DIAGNOSIS — Z6833 Body mass index (BMI) 33.0-33.9, adult: Secondary | ICD-10-CM | POA: Diagnosis not present

## 2017-07-18 NOTE — Progress Notes (Signed)
Office Visit Note  Patient: [name]             Date of Birth: 1960/05/28           MRN: [id number]             PCP: Maury Dus, MD Referring: Maury Dus, MD Visit Date: 08/01/2017 Occupation: @GUAROCC @    Subjective:  Back pain   History of Present Illness: [name] is a 57 y.o. female with history of fibromyalgia and DDD.  She reports that she has been having increased pain in her neck thoracic and lumbar spine.  She states she is previously seeing Dr. Cay Schillings but would like [name] to manage her back pain.  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  She states that she has muscle aches and muscle tenderness in her back as well.  She is been taking tramadol for pain relief.  She is no longer taking Zanaflex 4 mg at bedtime.  She states that she is to go to a chiropractor for her back but was unable to due to the having to spend time with her foster children.  She reports she continues to have insomnia and takes gabapentin at bedtime which helps with her drowsiness.  She states that she occasionally does take Klonopin if she really cannot sleep at night.  She states she continues to have worsening fatigue.  She occasionally will have discomfort in her bilateral knee joints especially if she is climbing steps.    Activities of Daily Living:  Patient reports morning stiffness for 30 minutes.   Patient Denies nocturnal pain.  Difficulty dressing/grooming: Reports Difficulty climbing stairs: Reports Difficulty getting out of chair: Denies Difficulty using hands for taps, buttons, cutlery, and/or writing: Reports   Review of Systems  Constitutional: Positive for fatigue.  HENT: Negative for mouth sores, mouth dryness and nose dryness.   Eyes: Positive for dryness. Negative for pain and visual disturbance.  Respiratory: Negative for cough, hemoptysis, shortness of breath and difficulty breathing.     Cardiovascular: Positive for swelling in legs/feet. Negative for chest pain, palpitations and hypertension.  Gastrointestinal: Negative for blood in stool, constipation and diarrhea.  Endocrine: Negative for increased urination.  Genitourinary: Negative for difficulty urinating and painful urination.  Musculoskeletal: Positive for arthralgias, joint pain, muscle weakness, morning stiffness and muscle tenderness. Negative for joint swelling, myalgias and myalgias.  Skin: Negative for color change, pallor, rash, hair loss, nodules/bumps, skin tightness, ulcers and sensitivity to sunlight.  Allergic/Immunologic: Negative for susceptible to infections.  Neurological: Negative for dizziness, headaches and weakness.  Hematological: Negative for swollen glands.  Psychiatric/Behavioral: Positive for sleep disturbance. Negative for depressed mood. The patient is not nervous/anxious.     PMFS History:  Patient Active Problem List   Diagnosis Date Noted  . Fibromyalgia 06/05/2016  . Other insomnia 06/05/2016  . Other fatigue 06/05/2016  . DDD (degenerative disc disease), cervical 06/05/2016  . DDD (degenerative disc disease), lumbar 06/05/2016  . Osteopenia of multiple sites 06/05/2016  . History of asthma 06/05/2016  . History of Hashimoto thyroiditis 06/05/2016  . History of anxiety 06/05/2016  . Seasonal affective disorder ([place]) 06/05/2016  . Postmenopausal bleeding 12/02/2012    Class: Present on Admission  . Fibroids, submucosal 12/02/2012    Class: Present on Admission  . Hx of Lyme disease 06/02/2012  . Postural dizziness 08/08/2011  . Hyperparathyroidism, primary ([place]) 01/08/2011  Past Medical History:  Diagnosis Date  . Abnormal uterine bleeding (AUB)   . Allergic rhinitis   . Anxiety   . Asthma   . B12 deficiency   . Bruises easily   . Chronic back pain    oa  . Depression   . Fibromyalgia   . GERD (gastroesophageal reflux disease)    watches diet  . H/O hiatal  hernia   . Hashimoto thyroiditis   . History of babesiosis    PARASITITIS RESOLVED  . History of Bell's palsy    2000--  resolved  . History of concussion    age 57--  no residual  . History of hyperparathyroidism    PRIMARY--  S/P PARATHYROIDECTOMY 01-28-2011  . History of palpitations    stress induced  . History of uterine fibroid   . Hypoglycemia   . Lyme disease    CHRONIC --  FOR 22 YRS (SINCE APPROX 1996)  AND HX CAT SCRATCH DISEASE WHICH HAS RESOLVED  . Musculoskeletal chest pain    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  . Osteoarthritis    all joints secondary to lyme disease/  back is worse  . Vitamin D deficiency   . Wears contact lenses     Family History  Problem Relation Age of Onset  . Mitral valve prolapse Father   . Hypertension Father   . Dementia Father   . Hypothyroidism Mother   . Colon polyps Brother   . Aneurysm Brother   . Arthritis Brother   . Lupus Sister   . Rheum arthritis Sister   . Osteoarthritis Sister   . Cancer Maternal Aunt        breast   Past Surgical History:  Procedure Laterality Date  . DILATATION & CURRETTAGE/HYSTEROSCOPY WITH RESECTOCOPE N/A 12/02/2012   Procedure: DILATATION & CURETTAGE/HYSTEROSCOPY WITH RESECTOCOPE;  Surgeon: Darlyn Chamber, MD;  Location: [facility];  Service: Gynecology;  Laterality: N/A;  . GANGLION CYST EXCISION  2007   HAND  . HYSTEROSCOPIC MYOMECTOMY AND ABDOMINAL MYOMECTOMY  2006  . PARATHYROIDECTOMY  01/28/2011   Procedure: PARATHYROIDECTOMY;  Surgeon: Earnstine Regal, MD;  Location: [facility];  Service: General;  Laterality: N/A;   . STRESS ECHO REPORT  09-01-2009  DR [place]   NORMAL LVF/  EF 60%/  NORMAL HYPERDYNAMIC RESPONSE  . TILT TABLE STUDY  08-21-2011    DR KLEIN   BP WERE CONSISTENT 110-117 THROUGH OUT STUDY/ BASELINE HR 68-70  . TILT TABLE STUDY N/A 08/21/2011   Procedure: TILT TABLE STUDY;  Surgeon: Deboraha Sprang, MD;  [facility] C[facility];  Service: Cardiovascular;  Laterality: N/A;  .  TONSILLECTOMY  age 49   Social History   Social History Narrative  . Not on file     Objective: Vital Signs: BP 127/84 (BP Location: Left Arm, Patient Position: Sitting, Cuff Size: Normal)   Pulse 95   Resp 16   Ht 5' 4.5" (1.638 m)   Wt 200 lb (90.7 kg)   LMP 01/22/2011   BMI 33.80 kg/m    Physical Exam  Constitutional: She is oriented to person, place, and time. She appears well-developed and well-nourished.  HENT:  Head: Normocephalic and atraumatic.  Eyes: Conjunctivae and EOM are normal.  Neck: Normal range of motion.  Cardiovascular: Normal rate, regular rhythm, normal heart sounds and intact distal pulses.  Pulmonary/Chest: Effort normal and breath sounds normal.  Abdominal: Soft. Bowel sounds are normal.  Lymphadenopathy:    She has no cervical  adenopathy.  Neurological: She is alert and oriented to person, place, and time.  Skin: Skin is warm and dry. Capillary refill takes less than 2 seconds.  Psychiatric: She has a normal mood and affect. Her behavior is normal.  Nursing note and vitals reviewed.    Musculoskeletal Exam: C-spine limited range of motion with discomfort.  She has midline spinal tenderness.  She has trapezius tenderness bilaterally.  She has slightly limited range of motion of thoracic and lumbar spine with discomfort.  Shoulder joints, elbow joints, wrist joints, MCPs, PIPs, DIPs good range of motion with no synovitis.  She has tenderness of MCP joints.  Hip joints, knee joints, ankle joints, MTPs, PIPs, DIPs good range of motion with no synovitis.  She has mild PIP and DIP synovial thickening consistent with osteoarthritis of bilateral feet.  She has tenderness of bilateral trochanteric bursa.  CDAI Exam: No CDAI exam completed.    Investigation: No additional findings. CBC Latest Ref Rng & Units 06/19/2016 02/01/2015 12/02/2012  WBC 3.8 - 10.8 K/uL 4.9 4.2 4.4  Hemoglobin 11.7 - 15.5 g/dL 14.3 10.5(L) 13.7  Hematocrit 35.0 - 45.0 % 42.7 31.3(L)  39.1  Platelets 140 - 400 K/uL 185 104(L) 162   CMP Latest Ref Rng & Units 06/19/2016 02/01/2015 08/13/2011  Glucose 65 - 99 mg/dL 92 112(H) -  BUN 7 - 25 mg/dL 15 14 -  Creatinine 0.50 - 1.05 mg/dL 0.91 0.99 -  Sodium 135 - 146 mmol/L 139 143 -  Potassium 3.5 - 5.3 mmol/L 4.4 3.8 -  Chloride 98 - 110 mmol/L 107 109 -  CO2 20 - 31 mmol/L 26 28 -  Calcium 8.6 - 10.4 mg/dL 9.3 9.4 9.3  Total Protein 6.1 - 8.1 g/dL 6.8 6.6 -  Total Bilirubin 0.2 - 1.2 mg/dL 0.3 0.5 -  Alkaline Phos 33 - 130 U/L 68 59 -  AST 10 - 35 U/L 22 25 -  ALT 6 - 29 U/L 26 26 -     Imaging: Xr Thoracic Spine 2 View  Result Date: 08/01/2017 Anterior spurring of the thoracic spine was noted.  Mild disc space narrowing was noted.  No spondylolisthesis was noted.  No syndesmophytes were noted. Mild spondylosis of thoracic spine.  Xr Cervical Spine 2 Or 3 Views  Result Date: 08/01/2017 C5-C6 narrowing with anterior spurring was noted.  No facet joint arthropathy was noted. Impression: These findings are consistent with disc disease of cervical spine.  Xr Lumbar Spine 2-3 Views  Result Date: 08/01/2017 Mild anterior spurring was noted.  No significant disc space narrowing was noted.  No significant facet joint arthropathy was noted.  SI joints were unremarkable. Mild spondylosis of the lumbar spine.   Speciality Comments: No specialty comments available.    Procedures:  No procedures performed Allergies: Contrast media [iodinated diagnostic agents]; Flagyl [metronidazole hcl]; and Tinidazole   Assessment / Plan:     Visit Diagnoses: Fibromyalgia -She has generalized muscle aches and muscle tenderness due to fibromyalgia.  Her fibromyalgia has been flaring more frequently due to stress at home.  Is been having increased pain in her neck thoracic and lower back.  She requested x-rays of her C-spine, thoracic spine, lumbar spine.  She takes Cymbalta 60 mg daily.  She she is been taking gabapentin 300 mg p.o. 1 tablet  at bedtime which has been helping with her insomnia.  She has a lot of difficulty sleeping at night she will take her Klonopin.  She is no longer taking  Zanaflex 4 mg at bedtime.  She is encouraged to continue to try to exercise on a regular basis.  She is also encouraged to return to her chiropractor since that helped her symptoms in the past.  I also suggested that she try and get a massage for myofascial release.  Other insomnia: She take Gabapentin 300 mg po 1 tablet at bedtime which helps her sleep at night.    Other fatigue: Worsening.    DDD (degenerative disc disease), cervical -She has limited ROM with discomfort on exam. X-ray of the c-spine was obtained today.  She was given a handout of neck exercises she can perform at home.  She is planning on returning to see the chiropractor on a regular basis. Plan: XR Cervical Spine 2 or 3 views.  She has C5-C6 narrowing otherwise unremarkable.  DDD (degenerative disc disease), lumbar - She has been having increased pain in the lumbar region.  She has midline spinal tenderness but no sciatica. X-ray of the lumbar spine was obtained today. Plan: XR Lumbar Spine 2-3 Views.  Showed only facet joint arthropathy.  Trochanteric bursitis, both hip: She has tenderness of bilateral trochanteric bursa.  She was given a handout of exercises she can perform at home.   Chronic midline thoracic back pain -She has midline spinal tenderness in the thoracic region.  She has generalized muscle tenderness.  XR of thoracic spine was obtained today.  Back exercises were provided to the patient. Plan: XR Thoracic Spine 2 View.  The x-ray revealed multilevel spondylosis.  Proper posture and muscle strengthening exercises were discussed.  Handout was given.  Chronic midline low back pain without sciatica -She has midline spinal tenderness in the lumbar region.  She was previously seeing Dr. Cay Schillings.  X-rays of the lumbar spine were obtained today.  She was given a handout  of back exercises she can perform at home. Plan: XR Lumbar Spine 2-3 Views  Neck pain - She has been having increased pain and neck stiffness.  She requested a x-ray of the c-spine.  She was given a handout of neck exercises she can perform at home. Plan: XR Cervical Spine 2 or 3 views  Medication monitoring encounter -She takes Tramadol for pain relief. UDS and narcotic agreement were updated today. Plan: Pain Mgmt, Tramadol w/medMATCH, U, Pain Mgmt, Profile 5 w/Conf, U   Other medical conditions are listed as follows:   Osteopenia of multiple sites  History of asthma  History of Hashimoto thyroiditis  History of anxiety  History of hyperparathyroidism  History of Lyme disease per patient.  She has been followed by Dr. Janalee Dane.  Who has been prescribing her Plaquenil and other medications.    Orders: Orders Placed This Encounter  Procedures  . XR Cervical Spine 2 or 3 views  . XR Thoracic Spine 2 View  . XR Lumbar Spine 2-3 Views  . Pain Mgmt, Tramadol w/medMATCH, U  . Pain Mgmt, Profile 5 w/Conf, U   No orders of the defined types were placed in this encounter.   Face-to-face time spent with patient was 30 minutes. >50% of time was spent in counseling and coordination of care.  Follow-Up Instructions: Return in about 6 months (around 01/31/2018) for Fibromyalgia, DDD, Ankylosing spondylitis.   Hazel Sams PA-C  I examined and evaluated the patient with Hazel Sams PA.  Patient continues to have a lot of back discomfort and generalized pain from fibromyalgia.  The x-rays obtained today were reviewed with patient.  She will  continue tramadol for pain management.  Side effects were again reviewed today.  The plan of care was discussed as noted above.   Bo Merino, MD  Note - This record has been created using Editor, commissioning.  Chart creation errors have been sought, but may not always  have been located. Such creation errors do not reflect on  the standard of  medical care.

## 2017-08-01 ENCOUNTER — Encounter (INDEPENDENT_AMBULATORY_CARE_PROVIDER_SITE_OTHER): Payer: Self-pay

## 2017-08-01 ENCOUNTER — Ambulatory Visit (INDEPENDENT_AMBULATORY_CARE_PROVIDER_SITE_OTHER): Payer: Self-pay

## 2017-08-01 ENCOUNTER — Ambulatory Visit (INDEPENDENT_AMBULATORY_CARE_PROVIDER_SITE_OTHER): Payer: 59 | Admitting: Rheumatology

## 2017-08-01 ENCOUNTER — Encounter: Payer: Self-pay | Admitting: Physician Assistant

## 2017-08-01 VITALS — BP 127/84 | HR 95 | Resp 16 | Ht 64.5 in | Wt 200.0 lb

## 2017-08-01 DIAGNOSIS — M546 Pain in thoracic spine: Secondary | ICD-10-CM

## 2017-08-01 DIAGNOSIS — Z5181 Encounter for therapeutic drug level monitoring: Secondary | ICD-10-CM | POA: Diagnosis not present

## 2017-08-01 DIAGNOSIS — Z8619 Personal history of other infectious and parasitic diseases: Secondary | ICD-10-CM

## 2017-08-01 DIAGNOSIS — M7061 Trochanteric bursitis, right hip: Secondary | ICD-10-CM | POA: Diagnosis not present

## 2017-08-01 DIAGNOSIS — G4709 Other insomnia: Secondary | ICD-10-CM | POA: Diagnosis not present

## 2017-08-01 DIAGNOSIS — M5136 Other intervertebral disc degeneration, lumbar region: Secondary | ICD-10-CM

## 2017-08-01 DIAGNOSIS — M5030 Other cervical disc degeneration, unspecified cervical region: Secondary | ICD-10-CM | POA: Diagnosis not present

## 2017-08-01 DIAGNOSIS — Z8639 Personal history of other endocrine, nutritional and metabolic disease: Secondary | ICD-10-CM

## 2017-08-01 DIAGNOSIS — M797 Fibromyalgia: Secondary | ICD-10-CM

## 2017-08-01 DIAGNOSIS — M51369 Other intervertebral disc degeneration, lumbar region without mention of lumbar back pain or lower extremity pain: Secondary | ICD-10-CM

## 2017-08-01 DIAGNOSIS — R5383 Other fatigue: Secondary | ICD-10-CM

## 2017-08-01 DIAGNOSIS — M542 Cervicalgia: Secondary | ICD-10-CM

## 2017-08-01 DIAGNOSIS — M545 Low back pain: Secondary | ICD-10-CM

## 2017-08-01 DIAGNOSIS — G8929 Other chronic pain: Secondary | ICD-10-CM

## 2017-08-01 DIAGNOSIS — Z8659 Personal history of other mental and behavioral disorders: Secondary | ICD-10-CM | POA: Diagnosis not present

## 2017-08-01 DIAGNOSIS — Z8709 Personal history of other diseases of the respiratory system: Secondary | ICD-10-CM

## 2017-08-01 DIAGNOSIS — M8589 Other specified disorders of bone density and structure, multiple sites: Secondary | ICD-10-CM

## 2017-08-01 NOTE — Patient Instructions (Addendum)
Neck Exercises Neck exercises can be important for many reasons:  They can help you to improve and maintain flexibility in your neck. This can be especially important as you age.  They can help to make your neck stronger. This can make movement easier.  They can reduce or prevent neck pain.  They may help your upper back.  Ask your health care provider which neck exercises would be best for you. Exercises Neck Press Repeat this exercise 10 times. Do it first thing in the morning and right before bed or as told by your health care provider. 1. Lie on your back on a firm bed or on the floor with a pillow under your head. 2. Use your neck muscles to push your head down on the pillow and straighten your spine. 3. Hold the position as well as you can. Keep your head facing up and your chin tucked. 4. Slowly count to 5 while holding this position. 5. Relax for a few seconds. Then repeat.  Isometric Strengthening Do a full set of these exercises 2 times a day or as told by your health care provider. 1. Sit in a supportive chair and place your hand on your forehead. 2. Push forward with your head and neck while pushing back with your hand. Hold for 10 seconds. 3. Relax. Then repeat the exercise 3 times. 4. Next, do thesequence again, this time putting your hand against the back of your head. Use your head and neck to push backward against the hand pressure. 5. Finally, do the same exercise on either side of your head, pushing sideways against the pressure of your hand.  Prone Head Lifts Repeat this exercise 5 times. Do this 2 times a day or as told by your health care provider. 1. Lie face-down, resting on your elbows so that your chest and upper back are raised. 2. Start with your head facing downward, near your chest. Position your chin either on or near your chest. 3. Slowly lift your head upward. Lift until you are looking straight ahead. Then continue lifting your head as far back as  you can stretch. 4. Hold your head up for 5 seconds. Then slowly lower it to your starting position.  Supine Head Lifts Repeat this exercise 8-10 times. Do this 2 times a day or as told by your health care provider. 1. Lie on your back, bending your knees to point to the ceiling and keeping your feet flat on the floor. 2. Lift your head slowly off the floor, raising your chin toward your chest. 3. Hold for 5 seconds. 4. Relax and repeat.  Scapular Retraction Repeat this exercise 5 times. Do this 2 times a day or as told by your health care provider. 1. Stand with your arms at your sides. Look straight ahead. 2. Slowly pull both shoulders backward and downward until you feel a stretch between your shoulder blades in your upper back. 3. Hold for 10-30 seconds. 4. Relax and repeat.  Contact a health care provider if:  Your neck pain or discomfort gets much worse when you do an exercise.  Your neck pain or discomfort does not improve within 2 hours after you exercise. If you have any of these problems, stop exercising right away. Do not do the exercises again unless your health care provider says that you can. Get help right away if:  You develop sudden, severe neck pain. If this happens, stop exercising right away. Do not do the exercises again unless your   of these problems, stop exercising right away. Do not do the exercises again unless your health care provider says that you can.  Get help right away if:  · You develop sudden, severe neck pain. If this happens, stop exercising right away. Do not do the exercises again unless your health care provider says that you can.  Exercises  Neck Stretch    Repeat this exercise 3–5 times.  1. Do this exercise while standing or while sitting in a chair.  2. Place your feet flat on the floor, shoulder-width apart.  3. Slowly turn your head to the right. Turn it all the way to the right so you can look over your right shoulder. Do not tilt or tip your head.  4. Hold this position for 10–30 seconds.  5. Slowly turn your head to the left, to look over your left shoulder.  6. Hold this position for 10–30 seconds.    Neck Retraction   Repeat this exercise 8–10 times. Do this 3–4 times a day or as told by your health care provider.  1. Do this exercise while standing or while sitting in a sturdy chair.  2. Look straight ahead. Do not bend your neck.  3. Use your fingers to push your chin backward. Do not bend your neck for this movement. Continue to face straight ahead. If you are doing the exercise properly, you will feel a slight sensation in your throat and a stretch at the back of your neck.  4. Hold the stretch for 1–2 seconds. Relax and repeat.    This information is not intended to replace advice given to you by your health care provider. Make sure you discuss any questions you have with your health care provider.  Document Released: 01/23/2015 Document Revised: 07/20/2015 Document Reviewed: 08/22/2014  Elsevier Interactive Patient Education © 2018 [employer]  Back Exercises  If you have pain in your back, do these exercises 2–3 times each day or as told by your doctor. When the pain goes away, do the exercises once each day, but repeat the steps more times for each exercise (do more repetitions). If you do not have pain in your back, do these exercises once each day or as told by your doctor.  Exercises  Single Knee to Chest    Do these steps 3–5 times in a row for each leg:  1. Lie on your back on a firm bed or the floor with your legs stretched out.  2. Bring one knee to your chest.  3. Hold your knee to your chest by grabbing your knee or thigh.  4. Pull on your knee until you feel a gentle stretch in your lower back.  5. Keep doing the stretch for 10–30 seconds.  6. Slowly let go of your leg and straighten it.    Pelvic Tilt    Do these steps 5–10 times in a row:  1. Lie on your back on a firm bed or the floor with your legs stretched out.  2. Bend your knees so they point up to the ceiling. Your feet should be flat on the floor.  3. Tighten your lower belly (abdomen) muscles to press your lower back  against the floor. This will make your tailbone point up to the ceiling instead of pointing down to your feet or the floor.  4. Stay in this position for 5–10 seconds while you gently tighten your muscles and breathe evenly.    Cat–Cow    Do   the back of a cow. 5. Stay in this position for 5 seconds.  Press-Ups  Do these steps 5-10 times in a row: 1. Lie on your belly (face-down) on the floor. 2. Place your hands near your head, about shoulder-width apart. 3. While you keep your back relaxed and keep your hips on the floor, slowly straighten your arms to raise the top half of your body and lift your shoulders. Do not use your back muscles. To make yourself more comfortable, you may change where you place your hands. 4. Stay in this position for 5 seconds. 5. Slowly return to lying flat on the floor.  Bridges  Do these steps 10 times in a row: 1. Lie on your back on a firm surface. 2. Bend your knees so they point up to the ceiling. Your feet should be flat on the floor. 3. Tighten your butt muscles and lift your butt off of the floor until your waist is almost as high as your knees. If you do not feel the muscles working in your butt and the back of your thighs, slide your feet 1-2 inches farther away from your butt. 4. Stay in this position for 3-5 seconds. 5. Slowly lower your butt to the floor, and let your butt muscles relax.  If this exercise is too easy, try doing it with your arms crossed over your chest. Belly Crunches  Do these steps 5-10 times in a row: 1. Lie on your back on a firm bed or the floor with your  legs stretched out. 2. Bend your knees so they point up to the ceiling. Your feet should be flat on the floor. 3. Cross your arms over your chest. 4. Tip your chin a little bit toward your chest but do not bend your neck. 5. Tighten your belly muscles and slowly raise your chest just enough to lift your shoulder blades a tiny bit off of the floor. 6. Slowly lower your chest and your head to the floor.  Back Lifts Do these steps 5-10 times in a row: 1. Lie on your belly (face-down) with your arms at your sides, and rest your forehead on the floor. 2. Tighten the muscles in your legs and your butt. 3. Slowly lift your chest off of the floor while you keep your hips on the floor. Keep the back of your head in line with the curve in your back. Look at the floor while you do this. 4. Stay in this position for 3-5 seconds. 5. Slowly lower your chest and your face to the floor.  Contact a doctor if:  Your back pain gets a lot worse when you do an exercise.  Your back pain does not lessen 2 hours after you exercise. If you have any of these problems, stop doing the exercises. Do not do them again unless your doctor says it is okay. Get help right away if:  You have sudden, very bad back pain. If this happens, stop doing the exercises. Do not do them again unless your doctor says it is okay. This information is not intended to replace advice given to you by your health care provider. Make sure you discuss any questions you have with your health care provider. Document Released: 03/16/2010 Document Revised: 07/20/2015 Document Reviewed: 04/07/2014 Elsevier Interactive Patient Education  2018 [employer]. Trochanteric Bursitis Rehab Ask your health care provider which exercises are safe for you. Do exercises exactly as told by your health care provider and adjust them as  directed. It is normal to feel mild stretching, pulling, tightness, or discomfort as you do these exercises, but you should stop  right away if you feel sudden pain or your pain gets worse.Do not begin these exercises until told by your health care provider. Stretching exercises These exercises warm up your muscles and joints and improve the movement and flexibility of your hip. These exercises also help to relieve pain and stiffness. Exercise A: Iliotibial band stretch  1. Lie on your side with your left / right leg in the top position. 2. Bend your left / right knee and grab your ankle. 3. Slowly bring your knee back so your thigh is behind your body. 4. Slowly lower your knee toward the floor until you feel a gentle stretch on the outside of your left / right thigh. If you do not feel a stretch and your knee will not fall farther, place the heel of your other foot on top of your outer knee and pull your thigh down farther. 5. Hold this position for __________ seconds. 6. Slowly return to the starting position. Repeat __________ times. Complete this exercise __________ times a day. Strengthening exercises These exercises build strength and endurance in your hip and pelvis. Endurance is the ability to use your muscles for a long time, even after they get tired. Exercise B: Bridge ( hip extensors) 1. Lie on your back on a firm surface with your knees bent and your feet flat on the floor. 2. Tighten your buttocks muscles and lift your buttocks off the floor until your trunk is level with your thighs. You should feel the muscles working in your buttocks and the back of your thighs. If this exercise is too easy, try doing it with your arms crossed over your chest. 3. Hold this position for __________ seconds. 4. Slowly return to the starting position. 5. Let your muscles relax completely between repetitions. Repeat __________ times. Complete this exercise __________ times a day. Exercise C: Squats ( knee extensors and  quadriceps) 1. Stand in front of a table, with your feet and knees pointing straight ahead. You may rest  your hands on the table for balance but not for support. 2. Slowly bend your knees and lower your hips like you are going to sit in a chair. ? Keep your weight over your heels, not over your toes. ? Keep your lower legs upright so they are parallel with the table legs. ? Do not let your hips go lower than your knees. ? Do not bend lower than told by your health care provider. ? If your hip pain increases, do not bend as low. 3. Hold this position for __________ seconds. 4. Slowly push with your legs to return to standing. Do not use your hands to pull yourself to standing. Repeat __________ times. Complete this exercise __________ times a day. Exercise D: Hip hike 1. Stand sideways on a bottom step. Stand on your left / right leg with your other foot unsupported next to the step. You can hold onto the railing or wall if needed for balance. 2. Keeping your knees straight and your torso square, lift your left / right hip up toward the ceiling. 3. Hold this position for __________ seconds. 4. Slowly let your left / right hip lower toward the floor, past the starting position. Your foot should get closer to the floor. Do not lean or bend your knees. Repeat __________ times. Complete this exercise __________ times a day. Exercise E: Single leg  stand 1. Stand near a counter or door frame that you can hold onto for balance as needed. It is helpful to stand in front of a mirror for this exercise so you can watch your hip. 2. Squeeze your left / right buttock muscles then lift up your other foot. Do not let your left / right hip push out to the side. 3. Hold this position for __________ seconds. Repeat __________ times. Complete this exercise __________ times a day. This information is not intended to replace advice given to you by your health care provider. Make sure you discuss any questions you have with your health care provider. Document Released: 03/21/2004 Document Revised: 10/19/2015 Document  Reviewed: 01/27/2015 Elsevier Interactive Patient Education  [employer].

## 2017-08-04 DIAGNOSIS — Z79899 Other long term (current) drug therapy: Secondary | ICD-10-CM | POA: Diagnosis not present

## 2017-08-04 DIAGNOSIS — E039 Hypothyroidism, unspecified: Secondary | ICD-10-CM | POA: Diagnosis not present

## 2017-08-04 DIAGNOSIS — R7989 Other specified abnormal findings of blood chemistry: Secondary | ICD-10-CM | POA: Diagnosis not present

## 2017-08-05 LAB — PAIN MGMT, PROFILE 5 W/CONF, U
Amphetamine: NEGATIVE ng/mL (ref <250)
Amphetamines: NEGATIVE ng/mL (ref <500)
BENZODIAZEPINES: NEGATIVE ng/mL (ref <100)
Barbiturates: NEGATIVE ng/mL (ref <300)
COCAINE METABOLITE: NEGATIVE ng/mL (ref <150)
Creatinine: 175.8 mg/dL
METHADONE METABOLITE: NEGATIVE ng/mL (ref <100)
METHAMPHETAMINE: NEGATIVE ng/mL (ref <250)
Marijuana Metabolite: NEGATIVE ng/mL (ref <20)
OXYCODONE: NEGATIVE ng/mL (ref <100)
Opiates: NEGATIVE ng/mL (ref <100)
Oxidant: NEGATIVE ug/mL (ref <200)
pH: 7.72 (ref 4.5–9.0)

## 2017-08-05 LAB — PAIN MGMT, TRAMADOL W/MEDMATCH, U
DESMETHYLTRAMADOL: 1169 ng/mL — AB (ref <100)
Tramadol: 3224 ng/mL — ABNORMAL HIGH (ref <100)

## 2017-08-13 DIAGNOSIS — L509 Urticaria, unspecified: Secondary | ICD-10-CM | POA: Diagnosis not present

## 2017-09-02 ENCOUNTER — Other Ambulatory Visit: Payer: Self-pay | Admitting: Physician Assistant

## 2017-09-03 NOTE — Telephone Encounter (Signed)
Last Visit: 08/01/17 Next visit: 01/30/18 UDS: 08/01/17 Narc Agreement: 08/07/17  Okay to refill Tramadol?

## 2017-09-17 DIAGNOSIS — R5383 Other fatigue: Secondary | ICD-10-CM | POA: Diagnosis not present

## 2017-09-17 DIAGNOSIS — A6920 Lyme disease, unspecified: Secondary | ICD-10-CM | POA: Diagnosis not present

## 2017-10-22 DIAGNOSIS — R5383 Other fatigue: Secondary | ICD-10-CM | POA: Diagnosis not present

## 2017-10-22 DIAGNOSIS — A6920 Lyme disease, unspecified: Secondary | ICD-10-CM | POA: Diagnosis not present

## 2017-11-05 ENCOUNTER — Other Ambulatory Visit: Payer: Self-pay | Admitting: Physician Assistant

## 2017-11-05 NOTE — Telephone Encounter (Signed)
ok 

## 2017-11-05 NOTE — Telephone Encounter (Signed)
Last Visit: 08/01/17 Next visit: 01/30/18 UDS: 08/01/17 Narc Agreement: 08/07/17  Okay to refill Tramadol?

## 2017-11-15 IMAGING — MR MR MRA HEAD W/O CM
10 of 11 series · 35 of 48 positions shown · non-contrast
Comparison: MR brain 08/06/2014.

CLINICAL DATA: Memory issues and headaches since 2011. Head injury
5192.

EXAM:
MRI HEAD WITHOUT CONTRAST
MRA HEAD WITHOUT CONTRAST
TECHNIQUE: Multiplanar, multiecho pulse sequences of the brain and surrounding
structures were obtained without intravenous contrast. Angiographic
images of the head were obtained using MRA technique without
contrast.

[Series 5: T1 · sagittal · 4.0mm · 0.75mm/px · 1 of 31 slices shown (1 of 2)]
[im 1/31]
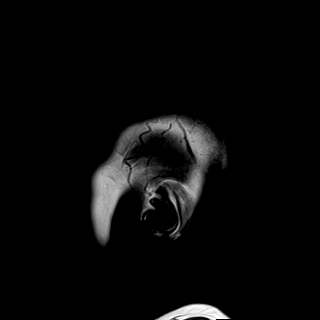

[Series 6: DWI · axial · 3.0mm · 1.44mm/px · z∈[-73,+62]mm · 6 of 84 slices shown (1 of 4)]
[im 1/84]
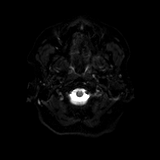
[im 17/84]
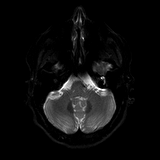
[im 34/84]
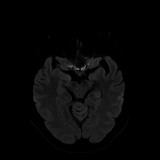
[im 50/84]
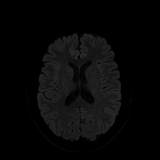
[im 67/84]
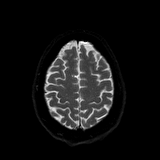
[im 84/84]
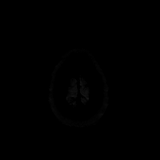

[Series 7: DWI · axial · 3.0mm · 1.44mm/px · z∈[-73,+62]mm · 3 of 42 slices shown (2 of 4)]
[im 1/42]
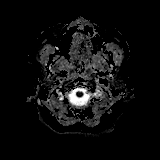
[im 21/42]
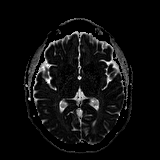
[im 42/42]
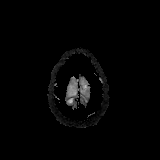

[Series 8: DWI · coronal · 5.0mm · 1.44mm/px · 4 of 60 slices shown (3 of 4)]
[im 1/60]
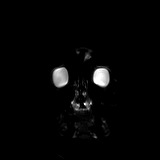
[im 20/60]
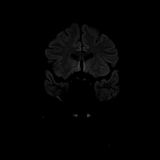
[im 40/60]
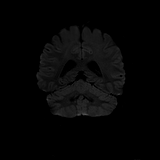
[im 60/60]
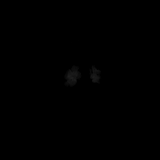

[Series 9: DWI · coronal · 5.0mm · 1.44mm/px · 2 of 30 slices shown (4 of 4)]
[im 1/30]
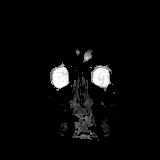
[im 30/30]
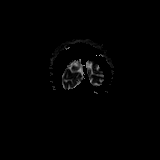

[Series 10: [id number] · axial · 0.6mm · 0.26mm/px · z∈[-66,-17]mm · 5 of 149 slices shown]
[im 1/149]
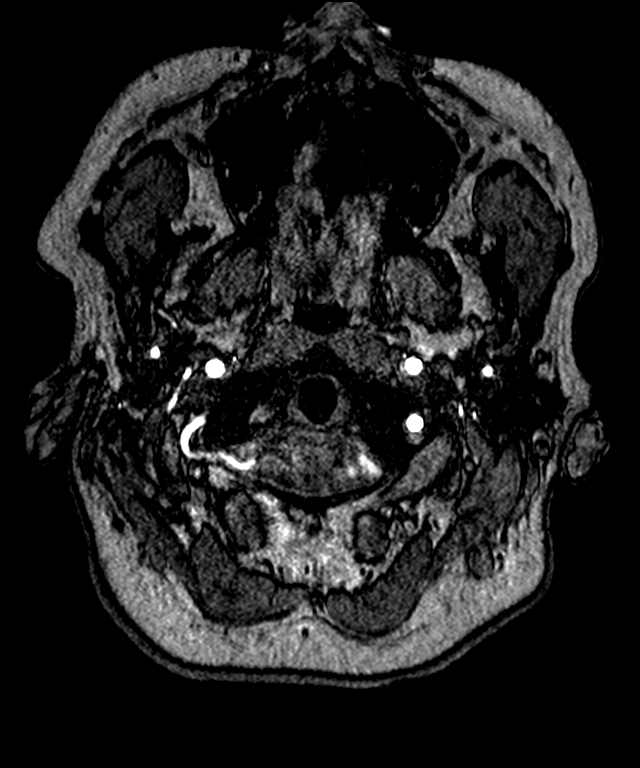
[im 17/149]
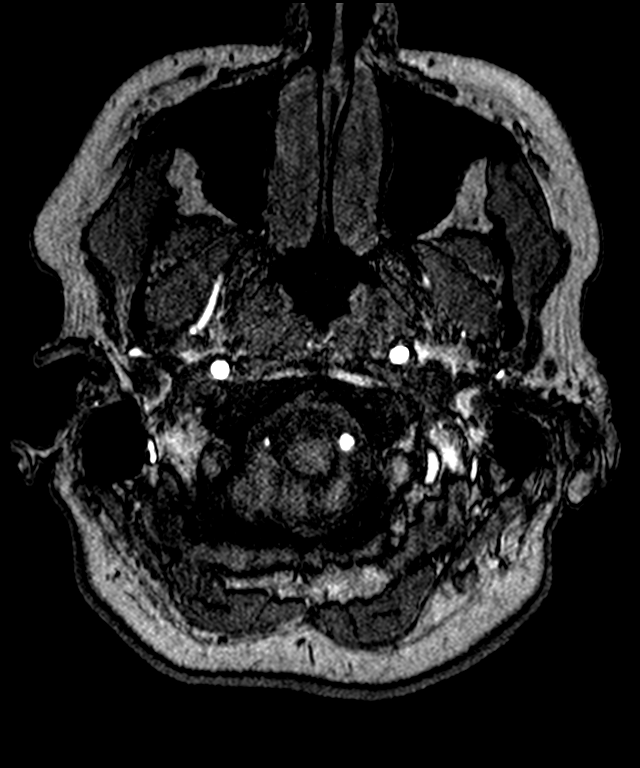
[im 50/149]
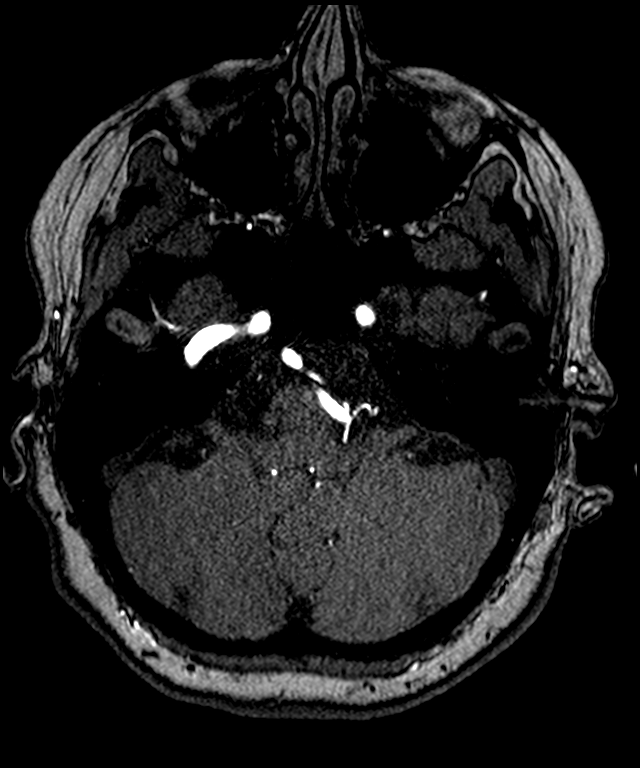
[im 66/149]
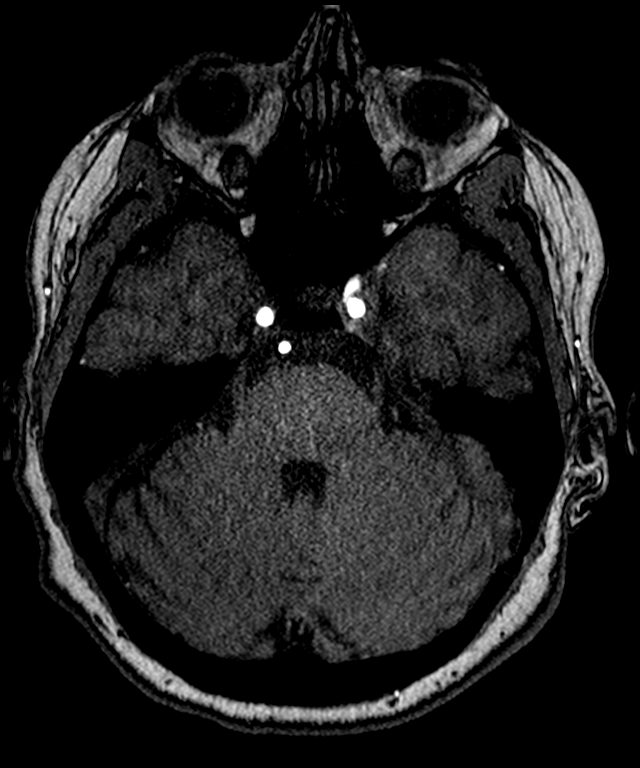
[im 83/149]
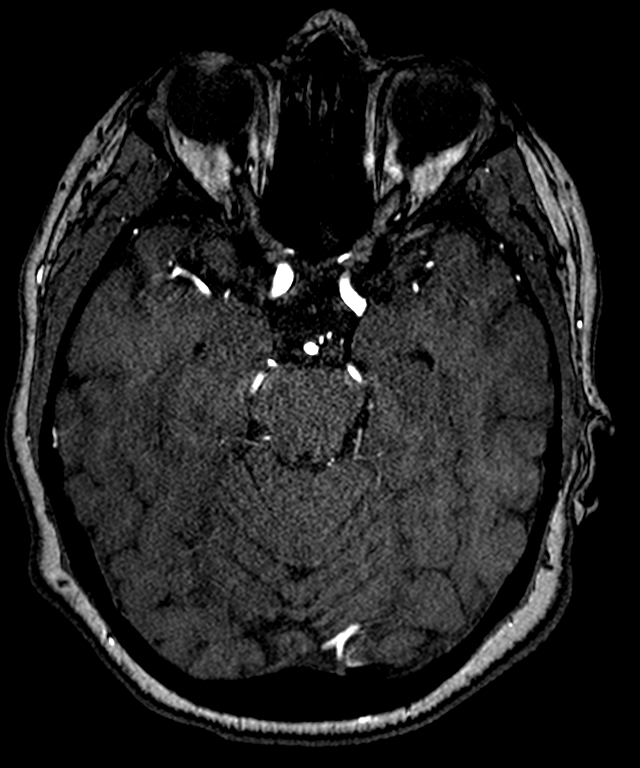

[Series 15: T2 · axial · 4.0mm · 0.36mm/px · z∈[-75,+60]mm · 2 of 27 slices shown (1 of 2)]
[im 1/27]
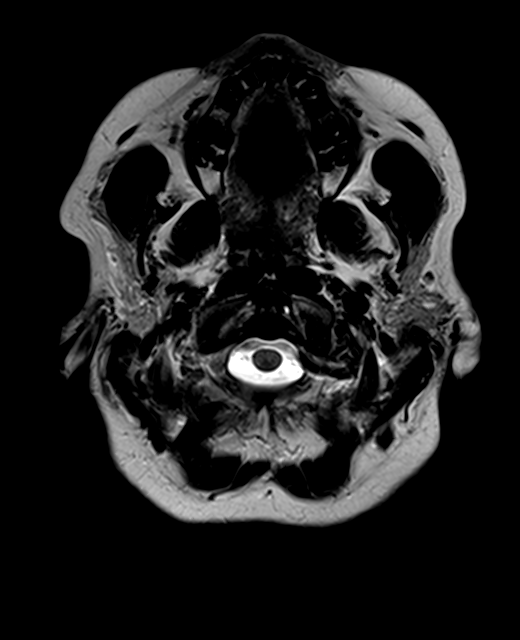
[im 27/27]
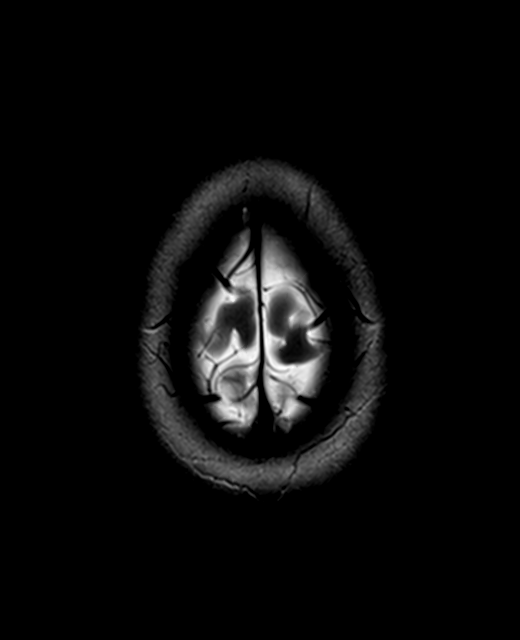

[Series 16: FLAIR · axial · 3.0mm · 0.72mm/px · z∈[-82,+67]mm · 2 of 26 slices shown]
[im 1/26]
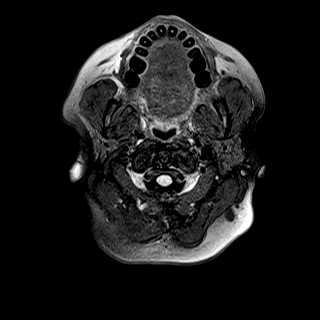
[im 26/26]
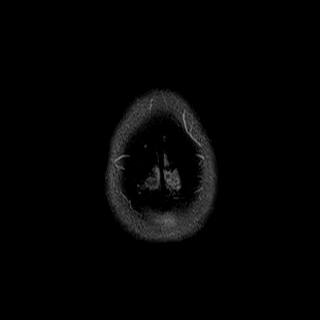

[Series 19: T1 · axial · 1.0mm · 0.90mm/px · z∈[-78,+64]mm · 8 of 144 slices shown (2 of 2)]
[im 1/144]
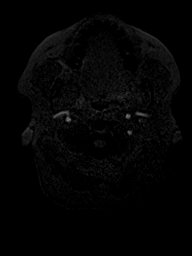
[im 16/144]
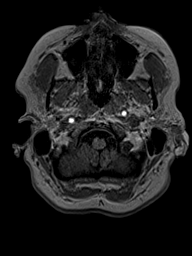
[im 48/144]
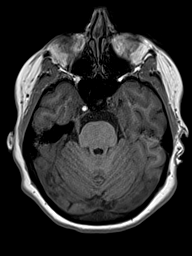
[im 64/144]
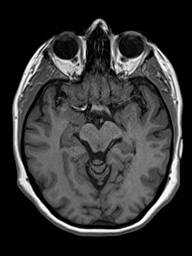
[im 80/144]
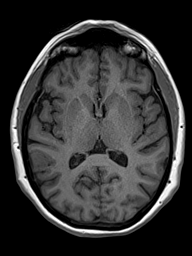
[im 96/144]
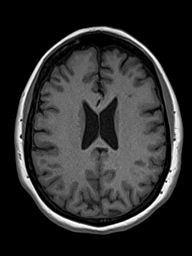
[im 128/144]
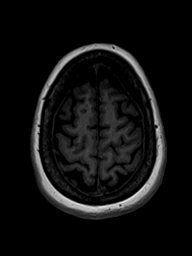
[im 144/144]
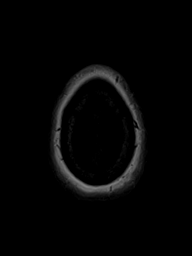

[Series 20: T2 · coronal · 4.5mm · 0.36mm/px · 2 of 30 slices shown (2 of 2)]
[im 1/30]
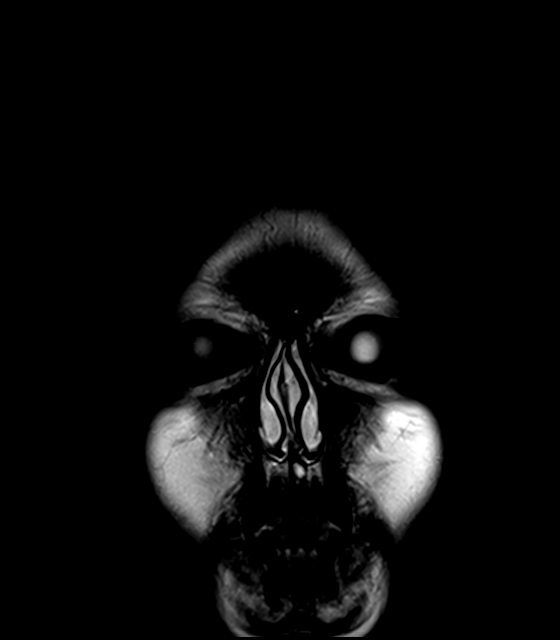
[im 30/30]
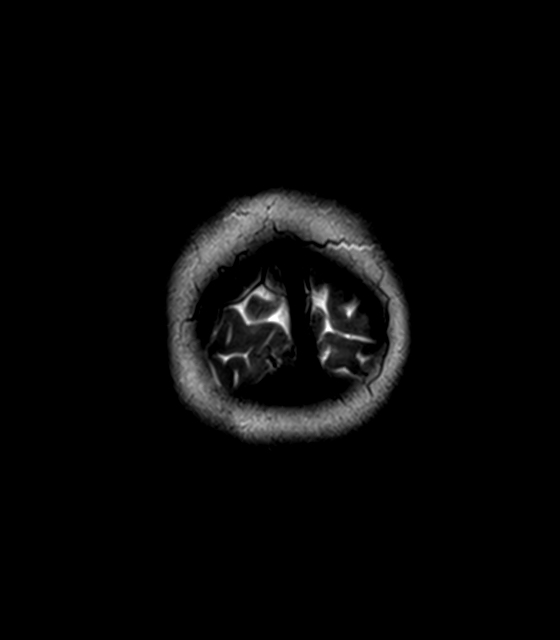

[35 of 48 positions shown; findings below may reference images not displayed]

FINDINGS: MRI HEAD FINDINGS

Brain: No acute infarction, hemorrhage, hydrocephalus, extra-axial
collection or mass lesion. Normal cerebral volume. No white matter
disease.

Vascular: Flow voids are maintained throughout the carotid, basilar,
and vertebral arteries. There are no areas of chronic hemorrhage.

Skull and upper cervical spine: Unremarkable visualized calvarium,
skullbase, and cervical vertebrae. Pituitary, pineal, cerebellar
tonsils unremarkable. No upper cervical cord lesions.

Sinuses/Orbits: Negative.

Other: None.

MRA HEAD FINDINGS

Widely patent internal carotid arteries. Widely patent basilar
artery with LEFT vertebral dominant. RIGHT vertebral predominantly
supplies the inferior cerebellum.

Dominant RIGHT A1 ACA provides supply the both distal anterior
arteries. Hypoplastic LEFT A1 ACA. Widely patent proximal and distal
BILATERAL middle cerebral arteries. Fetal origin LEFT PCA. No
proximal PCA stenosis or occlusion. No cerebellar branch occlusion.
No saccular aneurysm.

Compared with 7020, similar appearance.
IMPRESSION: Negative MRI brain and MRA intracranial. No acute or focal
intracranial abnormality.

No cause is seen for the reported symptoms.

## 2017-11-20 DIAGNOSIS — R7989 Other specified abnormal findings of blood chemistry: Secondary | ICD-10-CM | POA: Diagnosis not present

## 2017-11-20 DIAGNOSIS — R5382 Chronic fatigue, unspecified: Secondary | ICD-10-CM | POA: Diagnosis not present

## 2017-11-20 DIAGNOSIS — A6920 Lyme disease, unspecified: Secondary | ICD-10-CM | POA: Diagnosis not present

## 2017-12-04 DIAGNOSIS — Z8619 Personal history of other infectious and parasitic diseases: Secondary | ICD-10-CM | POA: Diagnosis not present

## 2017-12-04 DIAGNOSIS — Z23 Encounter for immunization: Secondary | ICD-10-CM | POA: Diagnosis not present

## 2017-12-04 DIAGNOSIS — M797 Fibromyalgia: Secondary | ICD-10-CM | POA: Diagnosis not present

## 2017-12-04 DIAGNOSIS — Z1211 Encounter for screening for malignant neoplasm of colon: Secondary | ICD-10-CM | POA: Diagnosis not present

## 2017-12-04 DIAGNOSIS — R5382 Chronic fatigue, unspecified: Secondary | ICD-10-CM | POA: Diagnosis not present

## 2017-12-04 DIAGNOSIS — H9193 Unspecified hearing loss, bilateral: Secondary | ICD-10-CM | POA: Diagnosis not present

## 2017-12-04 DIAGNOSIS — Z0000 Encounter for general adult medical examination without abnormal findings: Secondary | ICD-10-CM | POA: Diagnosis not present

## 2017-12-04 DIAGNOSIS — J309 Allergic rhinitis, unspecified: Secondary | ICD-10-CM | POA: Diagnosis not present

## 2017-12-04 DIAGNOSIS — J4530 Mild persistent asthma, uncomplicated: Secondary | ICD-10-CM | POA: Diagnosis not present

## 2017-12-04 DIAGNOSIS — Z13220 Encounter for screening for lipoid disorders: Secondary | ICD-10-CM | POA: Diagnosis not present

## 2017-12-16 DIAGNOSIS — Z13220 Encounter for screening for lipoid disorders: Secondary | ICD-10-CM | POA: Diagnosis not present

## 2017-12-16 DIAGNOSIS — Z0000 Encounter for general adult medical examination without abnormal findings: Secondary | ICD-10-CM | POA: Diagnosis not present

## 2017-12-26 HISTORY — PX: COLONOSCOPY: SHX174

## 2018-01-02 DIAGNOSIS — D123 Benign neoplasm of transverse colon: Secondary | ICD-10-CM | POA: Diagnosis not present

## 2018-01-02 DIAGNOSIS — Z8371 Family history of colonic polyps: Secondary | ICD-10-CM | POA: Diagnosis not present

## 2018-01-08 DIAGNOSIS — R5382 Chronic fatigue, unspecified: Secondary | ICD-10-CM | POA: Diagnosis not present

## 2018-01-08 DIAGNOSIS — R7989 Other specified abnormal findings of blood chemistry: Secondary | ICD-10-CM | POA: Diagnosis not present

## 2018-01-08 DIAGNOSIS — A6920 Lyme disease, unspecified: Secondary | ICD-10-CM | POA: Diagnosis not present

## 2018-01-16 DIAGNOSIS — D2262 Melanocytic nevi of left upper limb, including shoulder: Secondary | ICD-10-CM | POA: Diagnosis not present

## 2018-01-16 DIAGNOSIS — D225 Melanocytic nevi of trunk: Secondary | ICD-10-CM | POA: Diagnosis not present

## 2018-01-16 DIAGNOSIS — D1801 Hemangioma of skin and subcutaneous tissue: Secondary | ICD-10-CM | POA: Diagnosis not present

## 2018-01-16 DIAGNOSIS — L853 Xerosis cutis: Secondary | ICD-10-CM | POA: Diagnosis not present

## 2018-01-16 DIAGNOSIS — L918 Other hypertrophic disorders of the skin: Secondary | ICD-10-CM | POA: Diagnosis not present

## 2018-01-16 DIAGNOSIS — L821 Other seborrheic keratosis: Secondary | ICD-10-CM | POA: Diagnosis not present

## 2018-01-16 DIAGNOSIS — D2261 Melanocytic nevi of right upper limb, including shoulder: Secondary | ICD-10-CM | POA: Diagnosis not present

## 2018-01-16 DIAGNOSIS — L84 Corns and callosities: Secondary | ICD-10-CM | POA: Diagnosis not present

## 2018-01-16 DIAGNOSIS — L812 Freckles: Secondary | ICD-10-CM | POA: Diagnosis not present

## 2018-01-16 NOTE — Progress Notes (Signed)
Office Visit Note  Patient: [name]             Date of Birth: [birth year]           MRN: [id number]             PCP: Maury Dus, MD Referring: Maury Dus, MD Visit Date: 01/30/2018 Occupation: @GUAROCC @  Subjective:  Insomnia   History of Present Illness: [name] is a 57 y.o. female with history of fibromyalgia and DDD.  She is on Gabapentin 300 mg 1 tablet by mouth at bedtime, trazodone 150 mg at bedtime, and tramadol 50 mg 1-2 tablet BID PRN for pain relief.  She states on most days she takes tramadol 50 mg BID for pain relief.  She states she continues to have insomnia despite taking trazodone and gabapentin at bedtime. She has difficulty falling asleep but typically sleeps for 7-10 hours. She continues to have chronic fatigue. She continues to have bilateral trochanteric bursitis.  She states she continues to have chronic lower back pain and right sided pain at times.  She denies any numbness or tingling. She is wanting to go back to a chiropractor but does not have time right now.  She is having a massage tomorrow.  She states her neck pain has improved. She has trapezius muscle tension and tenderness.   Activities of Daily Living:  Patient reports morning stiffness for 0 minutes.   Patient Reports nocturnal pain.  Difficulty dressing/grooming: Denies Difficulty climbing stairs: Reports Difficulty getting out of chair: Reports Difficulty using hands for taps, buttons, cutlery, and/or writing: Denies  Review of Systems  Constitutional: Positive for fatigue.  HENT: Positive for mouth dryness. Negative for mouth sores and nose dryness.   Eyes: Negative for pain, visual disturbance and dryness.  Respiratory: Negative for cough, hemoptysis, shortness of breath and difficulty breathing.   Cardiovascular: Negative for chest pain, palpitations, hypertension and swelling in legs/feet.  Gastrointestinal: Negative for blood in stool, constipation and diarrhea.    Endocrine: Negative for increased urination.  Genitourinary: Negative for painful urination.  Musculoskeletal: Positive for arthralgias, joint pain, myalgias, muscle tenderness and myalgias. Negative for joint swelling, muscle weakness and morning stiffness.  Skin: Negative for color change, pallor, rash, hair loss, nodules/bumps, skin tightness, ulcers and sensitivity to sunlight.  Allergic/Immunologic: Negative for susceptible to infections.  Neurological: Negative for dizziness, numbness, headaches and weakness.  Hematological: Negative for swollen glands.  Psychiatric/Behavioral: Positive for depressed mood and sleep disturbance (Trazodone). The patient is not nervous/anxious.     PMFS History:  Patient Active Problem List   Diagnosis Date Noted  . Fibromyalgia 06/05/2016  . Other insomnia 06/05/2016  . Other fatigue 06/05/2016  . DDD (degenerative disc disease), cervical 06/05/2016  . DDD (degenerative disc disease), lumbar 06/05/2016  . Osteopenia of multiple sites 06/05/2016  . History of asthma 06/05/2016  . History of Hashimoto thyroiditis 06/05/2016  . History of anxiety 06/05/2016  . Seasonal affective disorder ([place]) 06/05/2016  . Postmenopausal bleeding 12/02/2012    Class: Present on Admission  . Fibroids, submucosal 12/02/2012    Class: Present on Admission  . Hx of Lyme disease 06/02/2012  . Postural dizziness 08/08/2011  . Hyperparathyroidism, primary ([place]) 01/08/2011    Past Medical History:  Diagnosis Date  . Abnormal uterine bleeding (AUB)   . Allergic rhinitis   . Anxiety   . Asthma   . B12 deficiency   . Bruises easily   . Chronic back pain  oa  . Depression   . Fibromyalgia   . GERD (gastroesophageal reflux disease)    watches diet  . H/O hiatal hernia   . Hashimoto thyroiditis   . History of babesiosis    PARASITITIS RESOLVED  . History of Bell's palsy    2000--  resolved  . History of concussion    age 83--  no residual  . History of  hyperparathyroidism    PRIMARY--  S/P PARATHYROIDECTOMY 01-28-2011  . History of palpitations    stress induced  . History of uterine fibroid   . Hypoglycemia   . Lyme disease    CHRONIC --  FOR 22 YRS (SINCE APPROX 1996)  AND HX CAT SCRATCH DISEASE WHICH HAS RESOLVED  . Musculoskeletal chest pain    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  . Osteoarthritis    all joints secondary to lyme disease/  back is worse  . Vitamin D deficiency   . Wears contact lenses     Family History  Problem Relation Age of Onset  . Mitral valve prolapse Father   . Hypertension Father   . Dementia Father   . Hypothyroidism Mother   . Colon polyps Brother   . Aneurysm Brother   . Arthritis Brother   . Lupus Sister   . Rheum arthritis Sister   . Osteoarthritis Sister   . Cancer Maternal Aunt        breast   Past Surgical History:  Procedure Laterality Date  . COLONOSCOPY  12/2017  . DILATATION & CURRETTAGE/HYSTEROSCOPY WITH RESECTOCOPE N/A 12/02/2012   Procedure: DILATATION & CURETTAGE/HYSTEROSCOPY WITH RESECTOCOPE;  Surgeon: Darlyn Chamber, MD;  Location: [place] [facility];  Service: Gynecology;  Laterality: N/A;  . GANGLION CYST EXCISION  2007   HAND  . HYSTEROSCOPIC MYOMECTOMY AND ABDOMINAL MYOMECTOMY  2006  . PARATHYROIDECTOMY  01/28/2011   Procedure: PARATHYROIDECTOMY;  Surgeon: Earnstine Regal, MD;  Location: [facility];  Service: General;  Laterality: N/A;   . STRESS ECHO REPORT  09-01-2009  DR [place]   NORMAL LVF/  EF 60%/  NORMAL HYPERDYNAMIC RESPONSE  . TILT TABLE STUDY  08-21-2011    DR KLEIN   BP WERE CONSISTENT 110-117 THROUGH OUT STUDY/ BASELINE HR 68-70  . TILT TABLE STUDY N/A 08/21/2011   Procedure: TILT TABLE STUDY;  Surgeon: Deboraha Sprang, MD;  [facility];  Service: Cardiovascular;  Laterality: N/A;  . TONSILLECTOMY  age 18   Social History   Social History Narrative  . Not on file    Objective: Vital Signs: BP (!) 145/103 (BP Location: Right Wrist, Patient Position:  Sitting, Cuff Size: Normal)   Pulse (!) 102   Resp 14   Ht 5\' 4"  (1.626 m)   Wt 203 lb (92.1 kg)   LMP 01/22/2011   BMI 34.84 kg/m    Physical Exam  Constitutional: She is oriented to person, place, and time. She appears well-developed and well-nourished.  HENT:  Head: Normocephalic and atraumatic.  Eyes: Conjunctivae and EOM are normal.  Neck: Normal range of motion.  Cardiovascular: Normal rate, regular rhythm, normal heart sounds and intact distal pulses.  Pulmonary/Chest: Effort normal and breath sounds normal.  Abdominal: Soft. Bowel sounds are normal.  Lymphadenopathy:    She has no cervical adenopathy.  Neurological: She is alert and oriented to person, place, and time.  Skin: Skin is warm and dry. Capillary refill takes less than 2 seconds.  Psychiatric: She has a normal mood and affect. Her behavior  is normal.  Nursing note and vitals reviewed.    Musculoskeletal Exam: Positive tender points and generalized hyperalgesia.  C-spine, thoracic spine, and lumbar spine good ROM.  No midline spinal tenderness.  No SI joint tenderness.  Shoulder joints, elbow joints, wrist joints, MCPs, PIPs, and DIPs good ROM with no synovitis.  Mild PIP and DIP synovial thickening consistent with osteoarthritis.  Hip joints, knee joints, ankle joints, MTPs, PIPs, and DIPs good ROM with no synovitis.  No warmth or effusion of knee joints.  No tenderness or swelling of ankle joints. Tenderness of bilateral trochanteric bursa.      CDAI Exam: CDAI Score: Not documented Patient Global Assessment: Not documented; Provider Global Assessment: Not documented Swollen: Not documented; Tender: Not documented Joint Exam   Not documented   There is currently no information documented on the homunculus. Go to the Rheumatology activity and complete the homunculus joint exam.  Investigation: No additional findings.  Imaging: No results found.  Recent Labs: Lab Results  Component Value Date   WBC 4.9  06/19/2016   HGB 14.3 06/19/2016   PLT 185 06/19/2016   NA 139 06/19/2016   K 4.4 06/19/2016   CL 107 06/19/2016   CO2 26 06/19/2016   GLUCOSE 92 06/19/2016   BUN 15 06/19/2016   CREATININE 0.91 06/19/2016   BILITOT 0.3 06/19/2016   ALKPHOS 68 06/19/2016   AST 22 06/19/2016   ALT 26 06/19/2016   PROT 6.8 06/19/2016   ALBUMIN 4.5 06/19/2016   CALCIUM 9.3 06/19/2016   GFRAA 83 06/19/2016    Speciality Comments: No specialty comments available.  Procedures:  No procedures performed Allergies: Contrast media [iodinated diagnostic agents]; Flagyl [metronidazole hcl]; and Tinidazole   Assessment / Plan:     Visit Diagnoses: Fibromyalgia -She has positive tender points and generalized hyperalgesia on exam.  She has trapezius muscle tension and muscle tenderness.  She has bilateral trochanteric bursitis.  She was given a handout of exercises that she can perform at home.  She is going for a massage tomorrow.  She is planning on starting to go back to see her chiropractor.  She will continue taking Gabapentin 300 mg po 1 tablet at bedtime, trazodone 150 mg at bedtime, and tramadol 50 mg 1-2 tablets BID for pain relief.  She will follow up in 5 months.   Other insomnia - She continues to have difficulty falling asleep at night despite taking Trazodone 150 mg at bedtime and Gabapentin 300 mg by mouth at bedtime.  She sleeps 7-10 hours per night.  She is going to follow up with PCP to discuss other sleep aids.   Other fatigue: Chronic and related to insomnia.   Trochanteric bursitis of both hips: She continues to have tenderness of bilateral trochanteric bursa.  She was given a handout of exercises that she can perform at home.   DDD (degenerative disc disease), cervical: Good ROM with some discomfort.  No symptoms of radiculopathy.    DDD (degenerative disc disease), lumbar: No midline spinal tenderness. No symptoms of radiculopathy or sciatica at this time.   Other chronic pain: She is  taking Tramadol 50 mg 1-2 tablets by mouth BID PRN for pain relief. UDS and narcotic agreement updated today 01/30/18.   Medication monitoring encounter -UDS and narcotic agreement updated today 01/30/18.  Plan: Pain Mgmt, Profile 5 w/Conf, U, Pain Mgmt, Tramadol w/medMATCH, U   Other medical conditions are listed as follows:   Osteopenia of multiple sites  History of asthma  History of Hashimoto thyroiditis  History of anxiety  History of hyperparathyroidism    Orders: Orders Placed This Encounter  Procedures  . Pain Mgmt, Profile 5 w/Conf, U  . Pain Mgmt, Tramadol w/medMATCH, U   No orders of the defined types were placed in this encounter.     Follow-Up Instructions: Return in about 6 months (around 08/01/2018) for Fibromyalgia, DDD.   Bo Merino, MD  Note - This record has been created using Editor, commissioning.  Chart creation errors have been sought, but may not always  have been located. Such creation errors do not reflect on  the standard of medical care.

## 2018-01-20 ENCOUNTER — Other Ambulatory Visit: Payer: Self-pay | Admitting: Physician Assistant

## 2018-01-20 NOTE — Telephone Encounter (Signed)
ok 

## 2018-01-20 NOTE — Telephone Encounter (Signed)
Last Visit: 08/01/17 Next visit: 01/30/18 UDS: 08/01/17 Narc Agreement: 08/07/17  Noted patient's appointment on 01/30/18 to update UDS and narc agreement.   Okay to refill Tramadol?

## 2018-01-30 ENCOUNTER — Ambulatory Visit (INDEPENDENT_AMBULATORY_CARE_PROVIDER_SITE_OTHER): Payer: 59 | Admitting: Rheumatology

## 2018-01-30 ENCOUNTER — Encounter: Payer: Self-pay | Admitting: Rheumatology

## 2018-01-30 VITALS — BP 145/103 | HR 102 | Resp 14 | Ht 64.0 in | Wt 203.0 lb

## 2018-01-30 DIAGNOSIS — R5383 Other fatigue: Secondary | ICD-10-CM

## 2018-01-30 DIAGNOSIS — G4709 Other insomnia: Secondary | ICD-10-CM | POA: Diagnosis not present

## 2018-01-30 DIAGNOSIS — R7989 Other specified abnormal findings of blood chemistry: Secondary | ICD-10-CM | POA: Diagnosis not present

## 2018-01-30 DIAGNOSIS — Z8639 Personal history of other endocrine, nutritional and metabolic disease: Secondary | ICD-10-CM

## 2018-01-30 DIAGNOSIS — Z8659 Personal history of other mental and behavioral disorders: Secondary | ICD-10-CM

## 2018-01-30 DIAGNOSIS — M7062 Trochanteric bursitis, left hip: Secondary | ICD-10-CM

## 2018-01-30 DIAGNOSIS — Z5181 Encounter for therapeutic drug level monitoring: Secondary | ICD-10-CM

## 2018-01-30 DIAGNOSIS — M5136 Other intervertebral disc degeneration, lumbar region: Secondary | ICD-10-CM | POA: Diagnosis not present

## 2018-01-30 DIAGNOSIS — M7061 Trochanteric bursitis, right hip: Secondary | ICD-10-CM | POA: Diagnosis not present

## 2018-01-30 DIAGNOSIS — Z8709 Personal history of other diseases of the respiratory system: Secondary | ICD-10-CM

## 2018-01-30 DIAGNOSIS — M797 Fibromyalgia: Secondary | ICD-10-CM

## 2018-01-30 DIAGNOSIS — G8929 Other chronic pain: Secondary | ICD-10-CM

## 2018-01-30 DIAGNOSIS — M8589 Other specified disorders of bone density and structure, multiple sites: Secondary | ICD-10-CM | POA: Diagnosis not present

## 2018-01-30 DIAGNOSIS — M5030 Other cervical disc degeneration, unspecified cervical region: Secondary | ICD-10-CM | POA: Diagnosis not present

## 2018-01-30 DIAGNOSIS — M51369 Other intervertebral disc degeneration, lumbar region without mention of lumbar back pain or lower extremity pain: Secondary | ICD-10-CM

## 2018-01-30 DIAGNOSIS — R5382 Chronic fatigue, unspecified: Secondary | ICD-10-CM | POA: Diagnosis not present

## 2018-01-30 DIAGNOSIS — A6920 Lyme disease, unspecified: Secondary | ICD-10-CM | POA: Diagnosis not present

## 2018-01-30 NOTE — Patient Instructions (Signed)

## 2018-02-03 DIAGNOSIS — Z23 Encounter for immunization: Secondary | ICD-10-CM | POA: Diagnosis not present

## 2018-02-03 DIAGNOSIS — E860 Dehydration: Secondary | ICD-10-CM | POA: Diagnosis not present

## 2018-02-03 LAB — PAIN MGMT, PROFILE 5 W/CONF, U
AMPHETAMINES: NEGATIVE ng/mL (ref <500)
Barbiturates: NEGATIVE ng/mL (ref <300)
Benzodiazepines: NEGATIVE ng/mL (ref <100)
Cocaine Metabolite: NEGATIVE ng/mL (ref <150)
Creatinine: 31.6 mg/dL
Marijuana Metabolite: NEGATIVE ng/mL (ref <20)
Methadone Metabolite: NEGATIVE ng/mL (ref <100)
Opiates: NEGATIVE ng/mL (ref <100)
Oxidant: NEGATIVE ug/mL (ref <200)
Oxycodone: NEGATIVE ng/mL (ref <100)
pH: 6.3 (ref 4.5–9.0)

## 2018-02-03 LAB — PAIN MGMT, TRAMADOL W/MEDMATCH, U
DESMETHYLTRAMADOL: 545 ng/mL — AB (ref <100)
Tramadol: 7532 ng/mL — ABNORMAL HIGH (ref <100)

## 2018-02-03 NOTE — Progress Notes (Signed)
UDS is consistent with treatment.

## 2018-02-12 ENCOUNTER — Other Ambulatory Visit: Payer: Self-pay | Admitting: Rheumatology

## 2018-02-13 DIAGNOSIS — R5382 Chronic fatigue, unspecified: Secondary | ICD-10-CM | POA: Diagnosis not present

## 2018-02-13 DIAGNOSIS — R7989 Other specified abnormal findings of blood chemistry: Secondary | ICD-10-CM | POA: Diagnosis not present

## 2018-02-13 DIAGNOSIS — A6920 Lyme disease, unspecified: Secondary | ICD-10-CM | POA: Diagnosis not present

## 2018-03-26 DIAGNOSIS — A6920 Lyme disease, unspecified: Secondary | ICD-10-CM | POA: Diagnosis not present

## 2018-03-30 ENCOUNTER — Other Ambulatory Visit: Payer: Self-pay | Admitting: Rheumatology

## 2018-03-30 NOTE — Telephone Encounter (Signed)
Last Visit:01/30/18 Next Visit:07/31/18 UDS:01/30/18 Narc Agreement:01/30/18  Okay to refillTramadol? 

## 2018-03-31 ENCOUNTER — Other Ambulatory Visit: Payer: Self-pay | Admitting: Rheumatology

## 2018-03-31 NOTE — Telephone Encounter (Signed)
ok 

## 2018-04-09 DIAGNOSIS — A6920 Lyme disease, unspecified: Secondary | ICD-10-CM | POA: Diagnosis not present

## 2018-04-09 DIAGNOSIS — R5382 Chronic fatigue, unspecified: Secondary | ICD-10-CM | POA: Diagnosis not present

## 2018-04-09 DIAGNOSIS — R7989 Other specified abnormal findings of blood chemistry: Secondary | ICD-10-CM | POA: Diagnosis not present

## 2018-04-13 DIAGNOSIS — R51 Headache: Secondary | ICD-10-CM | POA: Diagnosis not present

## 2018-04-13 DIAGNOSIS — S0990XA Unspecified injury of head, initial encounter: Secondary | ICD-10-CM | POA: Diagnosis not present

## 2018-04-22 DIAGNOSIS — R51 Headache: Secondary | ICD-10-CM | POA: Diagnosis not present

## 2018-04-22 DIAGNOSIS — F0781 Postconcussional syndrome: Secondary | ICD-10-CM | POA: Diagnosis not present

## 2018-05-01 DIAGNOSIS — R7989 Other specified abnormal findings of blood chemistry: Secondary | ICD-10-CM | POA: Diagnosis not present

## 2018-05-01 DIAGNOSIS — R5382 Chronic fatigue, unspecified: Secondary | ICD-10-CM | POA: Diagnosis not present

## 2018-05-01 DIAGNOSIS — A6920 Lyme disease, unspecified: Secondary | ICD-10-CM | POA: Diagnosis not present

## 2018-05-13 ENCOUNTER — Ambulatory Visit: Payer: 59 | Admitting: Neurology

## 2018-05-18 ENCOUNTER — Telehealth: Payer: Self-pay | Admitting: *Deleted

## 2018-05-18 NOTE — Telephone Encounter (Signed)
Spoke with pt & discussed that d/t covid 19 pandemic, our office is severely reducing in person visits for at least the next two week, in order to minimize the risk to our patients and healthcare providers. Advised that we recommend patient convert to a telemedicine visit. Although there may be some limitations with this type of visit, we will take all precautions to reduce any security or privacy concerns. This will be treated like an in-office visit and we will file with your insurance, and there may be a patient responsible charge related to this service.   Patient stated that she thinks she needs to come in because she has nystagmus.

## 2018-05-18 NOTE — Telephone Encounter (Signed)
I spoke with the patient and advised that an in-office visit is not needed at this time and nystagmus can be seen with concussions. Offered pt a video visit or telephone visit or she can r/s to a later date. Pt agreed to do the video visit and agreed to our office filing visit with insurance and that we will take all precautions to reduce any security or privacy concerns. She is aware that we will be back in touch/email with further steps on setting up the appt. Advised pt she would need to download [employer] webex. She provided her email address (see below).   [email or web address]

## 2018-05-18 NOTE — Telephone Encounter (Signed)
Spoke with Dr. Jaynee Eagles. Can have nystagmus with concussions. Will offer virtual visit via webex or phone or if pt prefers to r/s she can.

## 2018-05-18 NOTE — Telephone Encounter (Signed)
Pre-visit charting will need to be done FYI.

## 2018-05-19 ENCOUNTER — Ambulatory Visit: Payer: Self-pay | Admitting: Neurology

## 2018-05-19 ENCOUNTER — Other Ambulatory Visit: Payer: Self-pay | Admitting: Rheumatology

## 2018-05-19 NOTE — Telephone Encounter (Signed)
Called [name] & LVM asking for call back to setup an appointment time for her webex appt today with Dr. Jaynee Eagles. Also need to discuss app that she needs to download. Please send to RN when [name] calls.

## 2018-05-19 NOTE — Telephone Encounter (Signed)
ok 

## 2018-05-19 NOTE — Telephone Encounter (Signed)
Last Visit:01/30/18 Next Visit:07/31/18 UDS:01/30/18 Narc Agreement:01/30/18  Okay to refillTramadol? 

## 2018-05-27 NOTE — Telephone Encounter (Signed)
Called pt again and LVM asking for call back to schedule her video visit with Dr. Jaynee Eagles. Advised we have openings next week and look forward to hearing back from her. Left office number in message.   Pt had already given consent. Was pending scheduling. Also need to make sure she downloaded webex.

## 2018-06-02 ENCOUNTER — Encounter: Payer: Self-pay | Admitting: *Deleted

## 2018-06-02 ENCOUNTER — Ambulatory Visit (INDEPENDENT_AMBULATORY_CARE_PROVIDER_SITE_OTHER): Payer: 59 | Admitting: Neurology

## 2018-06-02 ENCOUNTER — Other Ambulatory Visit: Payer: Self-pay

## 2018-06-02 ENCOUNTER — Encounter: Payer: Self-pay | Admitting: Neurology

## 2018-06-02 VITALS — Ht 64.5 in | Wt 200.0 lb

## 2018-06-02 DIAGNOSIS — R51 Headache: Secondary | ICD-10-CM

## 2018-06-02 DIAGNOSIS — S0990XA Unspecified injury of head, initial encounter: Secondary | ICD-10-CM

## 2018-06-02 DIAGNOSIS — G44319 Acute post-traumatic headache, not intractable: Secondary | ICD-10-CM | POA: Diagnosis not present

## 2018-06-02 DIAGNOSIS — R519 Headache, unspecified: Secondary | ICD-10-CM

## 2018-06-02 NOTE — Telephone Encounter (Signed)
Pt's video visit with Dr. Jaynee Eagles is scheduled for this afternoon. I abstracted her chart with the referral documents and called pt, LVM asking for call back to update her  Medications, etc prior to her appt.
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
## 2018-06-02 NOTE — Telephone Encounter (Signed)
Spoke with pt during video appt and updated her history, meds, etc.

## 2018-06-08 ENCOUNTER — Telehealth: Payer: Self-pay | Admitting: Neurology

## 2018-06-08 DIAGNOSIS — R5382 Chronic fatigue, unspecified: Secondary | ICD-10-CM | POA: Diagnosis not present

## 2018-06-08 DIAGNOSIS — A6920 Lyme disease, unspecified: Secondary | ICD-10-CM | POA: Diagnosis not present

## 2018-06-08 DIAGNOSIS — R7989 Other specified abnormal findings of blood chemistry: Secondary | ICD-10-CM | POA: Diagnosis not present

## 2018-06-08 NOTE — Telephone Encounter (Signed)
[employer] auth: [employer] via uch website/medicare order sent to [facility] they will reach out to the pt to schedule.

## 2018-06-18 ENCOUNTER — Telehealth: Payer: Self-pay | Admitting: *Deleted

## 2018-06-18 ENCOUNTER — Other Ambulatory Visit: Payer: Self-pay

## 2018-06-18 ENCOUNTER — Ambulatory Visit
Admission: RE | Admit: 2018-06-18 | Discharge: 2018-06-18 | Disposition: A | Discharge status: Home / Self Care | Payer: 59 | Source: Ambulatory Visit | Attending: Neurology | Admitting: Neurology

## 2018-06-18 DIAGNOSIS — R51 Headache: Secondary | ICD-10-CM | POA: Diagnosis not present

## 2018-06-18 DIAGNOSIS — R519 Headache, unspecified: Secondary | ICD-10-CM

## 2018-06-18 DIAGNOSIS — S0990XA Unspecified injury of head, initial encounter: Secondary | ICD-10-CM

## 2018-06-18 DIAGNOSIS — G44319 Acute post-traumatic headache, not intractable: Secondary | ICD-10-CM

## 2018-06-18 NOTE — Telephone Encounter (Signed)
-----   Message from Melvenia Beam, MD sent at 06/18/2018  9:14 AM EDT ----- CT of the head normal, thanks

## 2018-06-18 NOTE — Telephone Encounter (Signed)
Called pt & LVM asking for call back. When she calls back, please let her know that her CT of her head is normal.

## 2018-06-22 NOTE — Telephone Encounter (Signed)
Called pt again and LVM asking for call back. Left office number in message. Please give her the message below when she calls back.

## 2018-06-24 ENCOUNTER — Encounter: Payer: Self-pay | Admitting: *Deleted

## 2018-06-24 NOTE — Telephone Encounter (Signed)
I called the patient a 3rd time to let her know that her CT head was normal. LVM asking for call back (no DPR to leave details). I sent pt an "unable to contact" letter asking for call back for results. No details of results included in letter.

## 2018-06-25 NOTE — Telephone Encounter (Signed)
Pt called back and was informed that her CT results were normal. Pt verbalized understanding.

## 2018-06-25 NOTE — Telephone Encounter (Signed)
Noted  

## 2018-07-12 ENCOUNTER — Other Ambulatory Visit: Payer: Self-pay | Admitting: Rheumatology

## 2018-07-13 NOTE — Telephone Encounter (Signed)
ok 

## 2018-07-13 NOTE — Telephone Encounter (Signed)
Last Visit:01/30/18 Next Visit:07/31/18 UDS:01/30/18 Narc Agreement:01/30/18  Okay to refillTramadol?

## 2018-07-17 NOTE — Progress Notes (Deleted)
Office Visit Note  Patient: [name]             Date of Birth: 05-12-1960           MRN: [id number]             PCP: Maury Dus, MD Referring: Maury Dus, MD Visit Date: 07/31/2018 Occupation: @GUAROCC @  Subjective:  No chief complaint on file.   History of Present Illness: [name] is a 58 y.o. female ***   Activities of Daily Living:  Patient reports morning stiffness for *** {minute/hour:19697}.   Patient {ACTIONS;DENIES/REPORTS:21021675::"Denies"} nocturnal pain.  Difficulty dressing/grooming: {ACTIONS;DENIES/REPORTS:21021675::"Denies"} Difficulty climbing stairs: {ACTIONS;DENIES/REPORTS:21021675::"Denies"} Difficulty getting out of chair: {ACTIONS;DENIES/REPORTS:21021675::"Denies"} Difficulty using hands for taps, buttons, cutlery, and/or writing: {ACTIONS;DENIES/REPORTS:21021675::"Denies"}  No Rheumatology ROS completed.   PMFS History:  Patient Active Problem List   Diagnosis Date Noted  . Acute post-traumatic headache, not intractable 06/02/2018  . Fibromyalgia 06/05/2016  . Other insomnia 06/05/2016  . Other fatigue 06/05/2016  . DDD (degenerative disc disease), cervical 06/05/2016  . DDD (degenerative disc disease), lumbar 06/05/2016  . Osteopenia of multiple sites 06/05/2016  . History of asthma 06/05/2016  . History of Hashimoto thyroiditis 06/05/2016  . History of anxiety 06/05/2016  . Seasonal affective disorder ([place]) 06/05/2016  . Postmenopausal bleeding 12/02/2012    Class: Present on Admission  . Fibroids, submucosal 12/02/2012    Class: Present on Admission  . Hx of Lyme disease 06/02/2012  . Postural dizziness 08/08/2011  . Hyperparathyroidism, primary ([place]) 01/08/2011    Past Medical History:  Diagnosis Date  . Abnormal heart rate   . Abnormal uterine bleeding (AUB)   . Allergic rhinitis   . Anxiety   . Asthma   . B12 deficiency   . Bruises easily   . Cat-scratch disease    h/o  . Chronic back pain    oa  .  CMV (cytomegalovirus) ([place])    h/o  . Depression   . Fibrocystic breast disease   . Fibroids   . Fibromyalgia   . GERD (gastroesophageal reflux disease)    watches diet  . H/O hiatal hernia   . H/O myomectomy   . Hashimoto thyroiditis   . History of babesiosis    PARASITITIS RESOLVED  . History of Bell's palsy    2000--  resolved  . History of concussion    age 61--  no residual  . History of hyperparathyroidism    PRIMARY--  S/P PARATHYROIDECTOMY 01-28-2011  . History of palpitations    stress induced  . History of uterine fibroid   . Hypoglycemia   . Hypothyroidism   . Lyme disease    CHRONIC --  FOR 22 YRS (SINCE APPROX 1996)  AND HX CAT SCRATCH DISEASE WHICH HAS RESOLVED  . Musculoskeletal chest pain    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  . Osteoarthritis    all joints secondary to lyme disease/  back is worse  . Vitamin D deficiency   . Wears contact lenses     Family History  Problem Relation Age of Onset  . Mitral valve prolapse Father   . Hypertension Father   . Dementia Father   . Hypothyroidism Mother   . Colon polyps Brother   . Aneurysm Brother   . Arthritis Brother   . Lupus Sister   . Rheum arthritis Sister   . Osteoarthritis Sister   . Cancer Maternal Aunt        breast   Past Surgical History:  Procedure Laterality Date  . COLONOSCOPY  12/2017  . DILATATION & CURRETTAGE/HYSTEROSCOPY WITH RESECTOCOPE N/A 12/02/2012   Procedure: DILATATION & CURETTAGE/HYSTEROSCOPY WITH RESECTOCOPE;  Surgeon: Darlyn Chamber, MD;  Location: [place];  Service: Gynecology;  Laterality: N/A;  . GANGLION CYST EXCISION  2007   HAND  . HYSTEROSCOPIC MYOMECTOMY AND ABDOMINAL MYOMECTOMY  2006  . PARATHYROIDECTOMY  01/28/2011   Procedure: PARATHYROIDECTOMY;  Surgeon: Earnstine Regal, MD;  Location: [facility];  Service: General;  Laterality: N/A;   . STRESS ECHO REPORT  09-01-2009  DR [place]   NORMAL LVF/  EF 60%/  NORMAL HYPERDYNAMIC RESPONSE  . TILT TABLE STUDY   08-21-2011    DR KLEIN   BP WERE CONSISTENT 110-117 THROUGH OUT STUDY/ BASELINE HR 68-70  . TILT TABLE STUDY N/A 08/21/2011   Procedure: TILT TABLE STUDY;  Surgeon: Deboraha Sprang, MD;  Location: [facility] CATH LAB;  Service: Cardiovascular;  Laterality: N/A;  . TONSILLECTOMY  age 60   Social History   Social History Narrative   Lives at home with her husband and her adopted baby    Right handed   Caffeine: 2 cups daily     There is no immunization history on file for this patient.   Objective: Vital Signs: LMP 01/22/2011    Physical Exam   Musculoskeletal Exam: ***  CDAI Exam: CDAI Score: Not documented Patient Global Assessment: Not documented; Provider Global Assessment: Not documented Swollen: Not documented; Tender: Not documented Joint Exam   Not documented   There is currently no information documented on the homunculus. Go to the Rheumatology activity and complete the homunculus joint exam.  Investigation: No additional findings.  Imaging: Ct Head Wo Contrast  Result Date: 06/18/2018 CLINICAL DATA:  Headaches. [place] and hit head on counter top in 03/2018. EXAM: CT HEAD WITHOUT CONTRAST TECHNIQUE: Contiguous axial images were obtained from the base of the skull through the vertex without intravenous contrast. COMPARISON:  Brain MRI 08/17/2016 FINDINGS: Brain: There is no evidence of acute infarct, intracranial hemorrhage, mass, midline shift, or extra-axial fluid collection. The ventricles and sulci are normal. Vascular: No hyperdense vessel. Skull: No fracture or focal osseous lesion. Sinuses/Orbits: The paranasal sinuses and mastoid air cells are clear. Orbits are unremarkable. Other: None. IMPRESSION: Unremarkable head CT. Electronically Signed   By: Logan Bores M.D.   On: 06/18/2018 08:46    Recent Labs: Lab Results  Component Value Date   WBC 4.9 06/19/2016   HGB 14.3 06/19/2016   PLT 185 06/19/2016   NA 139 06/19/2016   K 4.4 06/19/2016   CL 107 06/19/2016    CO2 26 06/19/2016   GLUCOSE 92 06/19/2016   BUN 15 06/19/2016   CREATININE 0.91 06/19/2016   BILITOT 0.3 06/19/2016   ALKPHOS 68 06/19/2016   AST 22 06/19/2016   ALT 26 06/19/2016   PROT 6.8 06/19/2016   ALBUMIN 4.5 06/19/2016   CALCIUM 9.3 06/19/2016   GFRAA 83 06/19/2016    Speciality Comments: No specialty comments available.  Procedures:  No procedures performed Allergies: Contrast media [iodinated diagnostic agents]; Levothyroxine; Tindamax [tinidazole]; Flagyl [metronidazole hcl]; and Tinidazole   Assessment / Plan:     Visit Diagnoses: No diagnosis found.   Orders: No orders of the defined types were placed in this encounter.  No orders of the defined types were placed in this encounter.   Face-to-face time spent with patient was *** minutes. Greater than 50% of time was spent in counseling and coordination  of care.  Follow-Up Instructions: No follow-ups on file.   Ofilia Neas, PA-C  Note - This record has been created using Dragon software.  Chart creation errors have been sought, but may not always  have been located. Such creation errors do not reflect on  the standard of medical care.

## 2018-07-27 DIAGNOSIS — R030 Elevated blood-pressure reading, without diagnosis of hypertension: Secondary | ICD-10-CM | POA: Diagnosis not present

## 2018-07-31 ENCOUNTER — Ambulatory Visit: Payer: Self-pay | Admitting: Rheumatology

## 2018-08-03 DIAGNOSIS — Z01419 Encounter for gynecological examination (general) (routine) without abnormal findings: Secondary | ICD-10-CM | POA: Diagnosis not present

## 2018-08-03 DIAGNOSIS — N941 Unspecified dyspareunia: Secondary | ICD-10-CM | POA: Diagnosis not present

## 2018-08-03 DIAGNOSIS — Z6833 Body mass index (BMI) 33.0-33.9, adult: Secondary | ICD-10-CM | POA: Diagnosis not present

## 2018-08-03 DIAGNOSIS — Z1231 Encounter for screening mammogram for malignant neoplasm of breast: Secondary | ICD-10-CM | POA: Diagnosis not present

## 2018-08-20 DIAGNOSIS — Z803 Family history of malignant neoplasm of breast: Secondary | ICD-10-CM | POA: Diagnosis not present

## 2018-08-20 DIAGNOSIS — Z809 Family history of malignant neoplasm, unspecified: Secondary | ICD-10-CM | POA: Diagnosis not present

## 2018-08-20 DIAGNOSIS — Z13820 Encounter for screening for osteoporosis: Secondary | ICD-10-CM | POA: Diagnosis not present

## 2018-08-24 NOTE — Progress Notes (Signed)
Office Visit Note  Patient: [name]             Date of Birth: 10-Dec-1960           MRN: [id number]             PCP: Maury Dus, MD Referring: Maury Dus, MD Visit Date: 09/07/2018 Occupation: @GUAROCC @  Subjective:  Generalized pain   History of Present Illness: IYONA Gonzalez is a 58 y.o. female with history of fibromyalgia and DDD.  She reports she is having trapezius muscle tension, neck, and lower back pain. She has bilateral trochanteric bursitis.  She takes tramadol 50 mg 1 to 2 tablets BID as needed for pain relief.  She states her pain is a 7/10 before taking tramadol and is a 4/10 after taking it.  She had a massage on Saturday, but she states it made her pain worse.  She has a TENS unit, and she uses it occasionally. She has chronic fatigue, but she attributes this to her history of Lyme's disease.   Activities of Daily Living:  Patient reports morning stiffness for 0 minutes.   Patient Reports nocturnal pain.  Difficulty dressing/grooming: Denies Difficulty climbing stairs: Denies Difficulty getting out of chair: Reports Difficulty using hands for taps, buttons, cutlery, and/or writing: Reports  Review of Systems  Constitutional: Positive for fatigue.  HENT: Negative for mouth sores, mouth dryness and nose dryness.   Eyes: Positive for dryness. Negative for pain, itching and visual disturbance.  Respiratory: Negative for cough, hemoptysis, shortness of breath, wheezing and difficulty breathing.   Cardiovascular: Negative for chest pain, palpitations, hypertension and swelling in legs/feet.  Gastrointestinal: Negative for abdominal pain, blood in stool, constipation and diarrhea.  Endocrine: Negative for increased urination.  Genitourinary: Negative for painful urination and pelvic pain.  Musculoskeletal: Positive for arthralgias, joint pain and morning stiffness. Negative for joint swelling, myalgias, muscle weakness, muscle tenderness and myalgias.   Skin: Negative for color change, pallor, rash, hair loss, nodules/bumps, redness, skin tightness, ulcers and sensitivity to sunlight.  Allergic/Immunologic: Negative for susceptible to infections.  Neurological: Negative for dizziness, light-headedness, numbness and headaches.  Hematological: Negative for swollen glands.  Psychiatric/Behavioral: Negative for depressed mood, confusion and sleep disturbance. The patient is not nervous/anxious.     PMFS History:  Patient Active Problem List   Diagnosis Date Noted  . Acute post-traumatic headache, not intractable 06/02/2018  . Fibromyalgia 06/05/2016  . Other insomnia 06/05/2016  . Other fatigue 06/05/2016  . DDD (degenerative disc disease), cervical 06/05/2016  . DDD (degenerative disc disease), lumbar 06/05/2016  . Osteopenia of multiple sites 06/05/2016  . History of asthma 06/05/2016  . History of Hashimoto thyroiditis 06/05/2016  . History of anxiety 06/05/2016  . Seasonal affective disorder ([place]) 06/05/2016  . Postmenopausal bleeding 12/02/2012    Class: Present on Admission  . Fibroids, submucosal 12/02/2012    Class: Present on Admission  . Hx of Lyme disease 06/02/2012  . Postural dizziness 08/08/2011  . Hyperparathyroidism, primary ([place]) 01/08/2011    Past Medical History:  Diagnosis Date  . Abnormal heart rate   . Abnormal uterine bleeding (AUB)   . Allergic rhinitis   . Anxiety   . Asthma   . B12 deficiency   . Bruises easily   . Cat-scratch disease    h/o  . Chronic back pain    oa  . CMV (cytomegalovirus) ([place])    h/o  . Depression   . Fibrocystic breast disease   .  Fibroids   . Fibromyalgia   . GERD (gastroesophageal reflux disease)    watches diet  . H/O hiatal hernia   . H/O myomectomy   . Hashimoto thyroiditis   . History of babesiosis    PARASITITIS RESOLVED  . History of Bell's palsy    2000--  resolved  . History of concussion    age 20--  no residual  . History of hyperparathyroidism     PRIMARY--  S/P PARATHYROIDECTOMY 01-28-2011  . History of palpitations    stress induced  . History of uterine fibroid   . Hypoglycemia   . Hypothyroidism   . Lyme disease    CHRONIC --  FOR 22 YRS (SINCE APPROX 1996)  AND HX CAT SCRATCH DISEASE WHICH HAS RESOLVED  . Musculoskeletal chest pain    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  . Osteoarthritis    all joints secondary to lyme disease/  back is worse  . Vitamin D deficiency   . Wears contact lenses     Family History  Problem Relation Age of Onset  . Mitral valve prolapse Father   . Hypertension Father   . Dementia Father   . Hypothyroidism Mother   . Colon polyps Brother   . Aneurysm Brother   . Arthritis Brother   . Lupus Sister   . Rheum arthritis Sister   . Osteoarthritis Sister   . Cancer Maternal Aunt        breast  . Breast cancer Cousin    Past Surgical History:  Procedure Laterality Date  . COLONOSCOPY  12/2017  . DILATATION & CURRETTAGE/HYSTEROSCOPY WITH RESECTOCOPE N/A 12/02/2012   Procedure: DILATATION & CURETTAGE/HYSTEROSCOPY WITH RESECTOCOPE;  Surgeon: Darlyn Chamber, MD;  Location: [facility];  Service: Gynecology;  Laterality: N/A;  . GANGLION CYST EXCISION  2007   HAND  . HYSTEROSCOPIC MYOMECTOMY AND ABDOMINAL MYOMECTOMY  2006  . PARATHYROIDECTOMY  01/28/2011   Procedure: PARATHYROIDECTOMY;  Surgeon: Earnstine Regal, MD;  Location: [facility];  Service: General;  Laterality: N/A;   . STRESS ECHO REPORT  09-01-2009  DR [place]   NORMAL LVF/  EF 60%/  NORMAL HYPERDYNAMIC RESPONSE  . TILT TABLE STUDY  08-21-2011    DR KLEIN   BP WERE CONSISTENT 110-117 THROUGH OUT STUDY/ BASELINE HR 68-70  . TILT TABLE STUDY N/A 08/21/2011   Procedure: TILT TABLE STUDY;  Surgeon: Deboraha Sprang, MD;  Location[facility];  Service: Cardiovascular;  Laterality: N/A;  . TONSILLECTOMY  age 39   Social History   Social History Narrative   Lives at home with her husband and her adopted baby    Right handed   Caffeine: 2  cups daily     There is no immunization history on file for this patient.   Objective: Vital Signs: BP (!) 142/90 (BP Location: Left Arm, Patient Position: Sitting, Cuff Size: Normal)   Pulse 72   Resp 12   Ht 5\' 4"  (1.626 m)   Wt 193 lb 9.6 oz (87.8 kg)   LMP 01/22/2011   BMI 33.23 kg/m    Physical Exam Vitals signs and nursing note reviewed.  Constitutional:      Appearance: She is well-developed.  HENT:     Head: Normocephalic and atraumatic.  Eyes:     Conjunctiva/sclera: Conjunctivae normal.  Neck:     Musculoskeletal: Normal range of motion.  Cardiovascular:     Rate and Rhythm: Normal rate and regular rhythm.     Heart  sounds: Normal heart sounds.  Pulmonary:     Effort: Pulmonary effort is normal.     Breath sounds: Normal breath sounds.  Abdominal:     General: Bowel sounds are normal.     Palpations: Abdomen is soft.  Lymphadenopathy:     Cervical: No cervical adenopathy.  Skin:    General: Skin is warm and dry.     Capillary Refill: Capillary refill takes less than 2 seconds.  Neurological:     Mental Status: She is alert and oriented to person, place, and time.  Psychiatric:        Behavior: Behavior normal.      Musculoskeletal Exam: Generalized hyperalgesia and positive tender points.  Trapezius muscle tension and tenderness. C-spine, thoracic spine, and lumbar spine good ROM with some discomfort.  Shoulder joints, elbow joints, wrist joints, MCPs, PIPs, and DIPs good ROM with no synovitis.  PIP and DIP synovial thickening consistent with osteoarthritis.  Hip joints, knee joints, ankle joints, MTPs, PIPs, and DIPs good ROM with no synovitis.  No warmth or effusion of knee joints.  No tenderness or swelling of ankle joints.  Tenderness over trochanteric bursa bilaterally.  PIP and DIP synovial thickening consistent with osteoarthritis.    CDAI Exam: CDAI Score: - Patient Global: -; Provider Global: - Swollen: -; Tender: - Joint Exam   No joint exam  has been documented for this visit   There is currently no information documented on the homunculus. Go to the Rheumatology activity and complete the homunculus joint exam.  Investigation: No additional findings.  Imaging: No results found.  Recent Labs: Lab Results  Component Value Date   WBC 4.9 06/19/2016   HGB 14.3 06/19/2016   PLT 185 06/19/2016   NA 139 06/19/2016   K 4.4 06/19/2016   CL 107 06/19/2016   CO2 26 06/19/2016   GLUCOSE 92 06/19/2016   BUN 15 06/19/2016   CREATININE 0.91 06/19/2016   BILITOT 0.3 06/19/2016   ALKPHOS 68 06/19/2016   AST 22 06/19/2016   ALT 26 06/19/2016   PROT 6.8 06/19/2016   ALBUMIN 4.5 06/19/2016   CALCIUM 9.3 06/19/2016   GFRAA 83 06/19/2016    Speciality Comments: No specialty comments available.  Procedures:  No procedures performed Allergies: Contrast media [iodinated diagnostic agents], Levothyroxine, Tindamax [tinidazole], Flagyl [metronidazole hcl], and Tinidazole   Assessment / Plan:     Visit Diagnoses: Fibromyalgia -She has generalized hyperalgesia and positive tender points.  She has trapezius muscle tension and muscle tenderness.  She has bilateral trochanteric bursitis. She is taking Tramadol 50 mg 1 to 2 tablets po BID as needed for pain relief.  Her pain is a 7/10 prior to taking tramadol and comes down to a 4/10 after taking it.  We discussed trying to reduce her dose of tramadol and try more natural options.  She does massages on a regular basis.  We discussed using a TENS unit.  We also discussed trying water aerobics once the pool is reopened.  She takes Cymbalta 60 mg 1 capsule by mouth daily and Gabapentin 300 mg 1 capsule by mouth at bedtime. She has chronic fatigue which she attributes to having a history of Lyme's disease.  She continues to have interrupted sleep at night.  She takes Trazodone 150 mg po at bedtime. She will follow up in 6 months.   Other fatigue -Chronic and related to Lyme's disease.   Other  insomnia -She takes Trazodone 150 mg at bedtime and Gabapentin 300 mg  by mouth at bedtime.   Trochanteric bursitis of both hips - She has tenderness bilaterally.  She cannot tolerate cortisone injections.  She was given a handout of exercises to perform.  DDD (degenerative disc disease), cervical -She has good range of motion.  She has no discomfort with lateral rotation.  She has trapezius muscle tension and muscle tenderness bilaterally.  She had massage on Saturday but not noticed much improvement.  We discussed trying to use a TENS unit for symptomatic relief.  She was given a handout of neck exercises.  DDD (degenerative disc disease), lumbar - She has chronic lower back pain.  She takes tramadol 50 mg 1 to 2 tablets twice daily as needed for pain relief.  Other chronic pain - tramadol 50 mg 1 to 2 tablets BID prn.  UDS and narcotic agreement were updated today on 09/07/2018.  Medication monitoring encounter -UDS and narcotic agreement were updated today. Plan: Pain Mgmt, Profile 5 w/Conf, U, Pain Mgmt, Tramadol w/medMATCH, U  Other medical conditions are listed as follows:   Osteopenia of multiple sites   History of hyperparathyroidism   History of Hashimoto thyroiditis   History of asthma   History of anxiety   Orders: Orders Placed This Encounter  Procedures  . Pain Mgmt, Profile 5 w/Conf, U  . Pain Mgmt, Tramadol w/medMATCH, U   No orders of the defined types were placed in this encounter.     Follow-Up Instructions: Return in about 6 months (around 03/10/2019) for Fibromyalgia, DDD.   Ofilia Neas, PA-C   I examined and evaluated the patient with Hazel Sams PA.  Patient continues to have some generalized pain and discomfort from underlying fibromyalgia and osteoarthritis.  She had no synovitis on examination.  The plan of care was discussed as noted above.  Bo Merino, MD  Note - This record has been created using Editor, commissioning.  Chart creation errors  have been sought, but may not always  have been located. Such creation errors do not reflect on  the standard of medical care.

## 2018-09-07 ENCOUNTER — Ambulatory Visit (INDEPENDENT_AMBULATORY_CARE_PROVIDER_SITE_OTHER): Payer: 59 | Admitting: Rheumatology

## 2018-09-07 ENCOUNTER — Encounter: Payer: Self-pay | Admitting: Rheumatology

## 2018-09-07 ENCOUNTER — Other Ambulatory Visit: Payer: Self-pay

## 2018-09-07 VITALS — BP 142/90 | HR 72 | Resp 12 | Ht 64.0 in | Wt 193.6 lb

## 2018-09-07 DIAGNOSIS — G4709 Other insomnia: Secondary | ICD-10-CM | POA: Diagnosis not present

## 2018-09-07 DIAGNOSIS — M8589 Other specified disorders of bone density and structure, multiple sites: Secondary | ICD-10-CM

## 2018-09-07 DIAGNOSIS — Z8659 Personal history of other mental and behavioral disorders: Secondary | ICD-10-CM | POA: Diagnosis not present

## 2018-09-07 DIAGNOSIS — M5030 Other cervical disc degeneration, unspecified cervical region: Secondary | ICD-10-CM | POA: Diagnosis not present

## 2018-09-07 DIAGNOSIS — G8929 Other chronic pain: Secondary | ICD-10-CM

## 2018-09-07 DIAGNOSIS — Z5181 Encounter for therapeutic drug level monitoring: Secondary | ICD-10-CM

## 2018-09-07 DIAGNOSIS — M5136 Other intervertebral disc degeneration, lumbar region: Secondary | ICD-10-CM

## 2018-09-07 DIAGNOSIS — M7062 Trochanteric bursitis, left hip: Secondary | ICD-10-CM

## 2018-09-07 DIAGNOSIS — Z8639 Personal history of other endocrine, nutritional and metabolic disease: Secondary | ICD-10-CM

## 2018-09-07 DIAGNOSIS — M51369 Other intervertebral disc degeneration, lumbar region without mention of lumbar back pain or lower extremity pain: Secondary | ICD-10-CM

## 2018-09-07 DIAGNOSIS — M797 Fibromyalgia: Secondary | ICD-10-CM

## 2018-09-07 DIAGNOSIS — M7061 Trochanteric bursitis, right hip: Secondary | ICD-10-CM | POA: Diagnosis not present

## 2018-09-07 DIAGNOSIS — Z8709 Personal history of other diseases of the respiratory system: Secondary | ICD-10-CM

## 2018-09-07 DIAGNOSIS — R5383 Other fatigue: Secondary | ICD-10-CM | POA: Diagnosis not present

## 2018-09-07 NOTE — Patient Instructions (Signed)
Neck Exercises Ask your health care provider which exercises are safe for you. Do exercises exactly as told by your health care provider and adjust them as directed. It is normal to feel mild stretching, pulling, tightness, or discomfort as you do these exercises. Stop right away if you feel sudden pain or your pain gets worse. Do not begin these exercises until told by your health care provider. Neck exercises can be important for many reasons. They can improve strength and maintain flexibility in your neck, which will help your upper back and prevent neck pain. Stretching exercises Rotation neck stretching  1. Sit in a chair or stand up. 2. Place your feet flat on the floor, shoulder width apart. 3. Slowly turn your head (rotate) to the right until a slight stretch is felt. Turn it all the way to the right so you can look over your right shoulder. Do not tilt or tip your head. 4. Hold this position for 10-30 seconds. 5. Slowly turn your head (rotate) to the left until a slight stretch is felt. Turn it all the way to the left so you can look over your left shoulder. Do not tilt or tip your head. 6. Hold this position for 10-30 seconds. Repeat __________ times. Complete this exercise __________ times a day. Neck retraction 1. Sit in a sturdy chair or stand up. 2. Look straight ahead. Do not bend your neck. 3. Use your fingers to push your chin backward (retraction). Do not bend your neck for this movement. Continue to face straight ahead. If you are doing the exercise properly, you will feel a slight sensation in your throat and a stretch at the back of your neck. 4. Hold the stretch for 1-2 seconds. Repeat __________ times. Complete this exercise __________ times a day. Strengthening exercises Neck press 1. Lie on your back on a firm bed or on the floor with a pillow under your head. 2. Use your neck muscles to push your head down on the pillow and straighten your spine. 3. Hold the position  as well as you can. Keep your head facing up (in a neutral position) and your chin tucked. 4. Slowly count to 5 while holding this position. Repeat __________ times. Complete this exercise __________ times a day. Isometrics These are exercises in which you strengthen the muscles in your neck while keeping your neck still (isometrics). 1. Sit in a supportive chair and place your hand on your forehead. 2. Keep your head and face facing straight ahead. Do not flex or extend your neck while doing isometrics. 3. Push forward with your head and neck while pushing back with your hand. Hold for 10 seconds. 4. Do the sequence again, this time putting your hand against the back of your head. Use your head and neck to push backward against the hand pressure. 5. Finally, do the same exercise on either side of your head, pushing sideways against the pressure of your hand. Repeat __________ times. Complete this exercise __________ times a day. Prone head lifts 1. Lie face-down (prone position), resting on your elbows so that your chest and upper back are raised. 2. Start with your head facing downward, near your chest. Position your chin either on or near your chest. 3. Slowly lift your head upward. Lift until you are looking straight ahead. Then continue lifting your head as far back as you can comfortably stretch. 4. Hold your head up for 5 seconds. Then slowly lower it to your starting position. Repeat __________  times. Complete this exercise __________ times a day. Supine head lifts 1. Lie on your back (supine position), bending your knees to point to the ceiling and keeping your feet flat on the floor. 2. Lift your head slowly off the floor, raising your chin toward your chest. 3. Hold for 5 seconds. Repeat __________ times. Complete this exercise __________ times a day. Scapular retraction 1. Stand with your arms at your sides. Look straight ahead. 2. Slowly pull both shoulders (scapulae) backward  and downward (retraction) until you feel a stretch between your shoulder blades in your upper back. 3. Hold for 10-30 seconds. 4. Relax and repeat. Repeat __________ times. Complete this exercise __________ times a day. Contact a health care provider if:  Your neck pain or discomfort gets much worse when you do an exercise.  Your neck pain or discomfort does not improve within 2 hours after you exercise. If you have any of these problems, stop exercising right away. Do not do the exercises again unless your health care provider says that you can. Get help right away if:  You develop sudden, severe neck pain. If this happens, stop exercising right away. Do not do the exercises again unless your health care provider says that you can. This information is not intended to replace advice given to you by your health care provider. Make sure you discuss any questions you have with your health care provider. Document Released: 01/23/2015 Document Revised: 12/10/2017 Document Reviewed: 12/10/2017 Elsevier Patient Education  2020 [employer]. Hip Bursitis Rehab Ask your health care provider which exercises are safe for you. Do exercises exactly as told by your health care provider and adjust them as directed. It is normal to feel mild stretching, pulling, tightness, or discomfort as you do these exercises. Stop right away if you feel sudden pain or your pain gets worse. Do not begin these exercises until told by your health care provider. Stretching exercise This exercise warms up your muscles and joints and improves the movement and flexibility of your hip. This exercise also helps to relieve pain and stiffness. Iliotibial band stretch An iliotibial band is a strong band of muscle tissue that runs from the outer side of your hip to the outer side of your thigh and knee. 1. Lie on your side with your left / right leg in the top position. 2. Bend your left / right knee and grab your ankle. Stretch out  your bottom arm to help you balance. 3. Slowly bring your knee back so your thigh is behind your body. 4. Slowly lower your knee toward the floor until you feel a gentle stretch on the outside of your left / right thigh. If you do not feel a stretch and your knee will not fall farther, place the heel of your other foot on top of your knee and pull your knee down toward the floor with your foot. 5. Hold this position for __________ seconds. 6. Slowly return to the starting position. Repeat __________ times. Complete this exercise __________ times a day. Strengthening exercises These exercises build strength and endurance in your hip and pelvis. Endurance is the ability to use your muscles for a long time, even after they get tired. Bridge This exercise strengthens the muscles that move your thigh backward (hip extensors). 1. Lie on your back on a firm surface with your knees bent and your feet flat on the floor. 2. Tighten your buttocks muscles and lift your buttocks off the floor until your trunk is  level with your thighs. ? Do not arch your back. ? You should feel the muscles working in your buttocks and the back of your thighs. If you do not feel these muscles, slide your feet 1-2 inches (2.5-5 cm) farther away from your buttocks. ? If this exercise is too easy, try doing it with your arms crossed over your chest. 3. Hold this position for __________ seconds. 4. Slowly lower your hips to the starting position. 5. Let your muscles relax completely after each repetition. Repeat __________ times. Complete this exercise __________ times a day. Squats This exercise strengthens the muscles in front of your thigh and knee (quadriceps). 1. Stand in front of a table, with your feet and knees pointing straight ahead. You may rest your hands on the table for balance but not for support. 2. Slowly bend your knees and lower your hips like you are going to sit in a chair. ? Keep your weight over your  heels, not over your toes. ? Keep your lower legs upright so they are parallel with the table legs. ? Do not let your hips go lower than your knees. ? Do not bend lower than told by your health care provider. ? If your hip pain increases, do not bend as low. 3. Hold the squat position for __________ seconds. 4. Slowly push with your legs to return to standing. Do not use your hands to pull yourself to standing. Repeat __________ times. Complete this exercise __________ times a day. Hip hike 1. Stand sideways on a bottom step. Stand on your left / right leg with your other foot unsupported next to the step. You can hold on to the railing or wall for balance if needed. 2. Keep your knees straight and your torso square. Then lift your left / right hip up toward the ceiling. 3. Hold this position for __________ seconds. 4. Slowly let your left / right hip lower toward the floor, past the starting position. Your foot should get closer to the floor. Do not lean or bend your knees. Repeat __________ times. Complete this exercise __________ times a day. Single leg stand 1. Without shoes, stand near a railing or in a doorway. You may hold on to the railing or door frame as needed for balance. 2. Squeeze your left / right buttock muscles, then lift up your other foot. ? Do not let your left / right hip push out to the side. ? It is helpful to stand in front of a mirror for this exercise so you can watch your hip. 3. Hold this position for __________ seconds. Repeat __________ times. Complete this exercise __________ times a day. This information is not intended to replace advice given to you by your health care provider. Make sure you discuss any questions you have with your health care provider. Document Released: 03/21/2004 Document Revised: 06/08/2018 Document Reviewed: 06/08/2018 Elsevier Patient Education  2020 [employer].

## 2018-09-09 LAB — PAIN MGMT, PROFILE 5 W/CONF, U
Amphetamines: NEGATIVE ng/mL
Barbiturates: NEGATIVE ng/mL
Benzodiazepines: NEGATIVE ng/mL
Cocaine Metabolite: NEGATIVE ng/mL
Creatinine: 35.4 mg/dL
Marijuana Metabolite: NEGATIVE ng/mL
Methadone Metabolite: NEGATIVE ng/mL
Opiates: NEGATIVE ng/mL
Oxidant: NEGATIVE ug/mL
Oxycodone: NEGATIVE ng/mL
pH: 7.1 (ref 4.5–9.0)

## 2018-09-09 LAB — PAIN MGMT, TRAMADOL W/MEDMATCH, U
Desmethyltramadol: 573 ng/mL
Tramadol: 1985 ng/mL

## 2018-09-09 NOTE — Progress Notes (Signed)
C/w

## 2018-09-15 ENCOUNTER — Other Ambulatory Visit: Payer: Self-pay | Admitting: Rheumatology

## 2018-09-15 NOTE — Telephone Encounter (Signed)
ok 

## 2018-09-15 NOTE — Telephone Encounter (Signed)
Last Visit: 09/07/18 Next Visit: 03/08/19 UDS: 09/07/18 Narc Agreement: 09/07/18   Okay to refill Tramadol?

## 2018-10-06 DIAGNOSIS — N941 Unspecified dyspareunia: Secondary | ICD-10-CM | POA: Diagnosis not present

## 2018-10-06 DIAGNOSIS — Z809 Family history of malignant neoplasm, unspecified: Secondary | ICD-10-CM | POA: Diagnosis not present

## 2018-10-07 ENCOUNTER — Other Ambulatory Visit: Payer: Self-pay | Admitting: Obstetrics and Gynecology

## 2018-10-07 DIAGNOSIS — Z803 Family history of malignant neoplasm of breast: Secondary | ICD-10-CM

## 2018-11-04 ENCOUNTER — Other Ambulatory Visit: Payer: Self-pay | Admitting: Rheumatology

## 2018-11-04 NOTE — Telephone Encounter (Signed)
Last Visit: 09/07/18 Next Visit: 03/08/19 UDS: 09/07/18 Narc Agreement: 09/07/18   Okay to refill Tramadol? 

## 2018-11-12 ENCOUNTER — Other Ambulatory Visit: Payer: Self-pay | Admitting: Cardiology

## 2018-11-12 DIAGNOSIS — Z20822 Contact with and (suspected) exposure to covid-19: Secondary | ICD-10-CM

## 2018-11-14 LAB — NOVEL CORONAVIRUS, NAA: SARS-CoV-2, NAA: NOT DETECTED

## 2018-11-16 ENCOUNTER — Telehealth: Payer: Self-pay | Admitting: *Deleted

## 2018-11-16 NOTE — Telephone Encounter (Signed)
Pt notified of covid-19 test result. Voiced understanding of negative results.

## 2018-11-30 ENCOUNTER — Ambulatory Visit
Admission: RE | Admit: 2018-11-30 | Discharge: 2018-11-30 | Disposition: A | Discharge status: Home / Self Care | Payer: No Typology Code available for payment source | Source: Ambulatory Visit | Attending: Obstetrics and Gynecology | Admitting: Obstetrics and Gynecology

## 2018-11-30 DIAGNOSIS — Z803 Family history of malignant neoplasm of breast: Secondary | ICD-10-CM

## 2018-11-30 MED ORDER — GADOBENATE DIMEGLUMINE 529 MG/ML IV SOLN
8.0000 mL | Freq: Once | INTRAVENOUS | Status: AC | PRN
  Administered 2018-11-30: 8 mL via INTRAVENOUS

## 2018-12-02 ENCOUNTER — Other Ambulatory Visit: Payer: Self-pay | Admitting: Obstetrics and Gynecology

## 2018-12-02 DIAGNOSIS — R9389 Abnormal findings on diagnostic imaging of other specified body structures: Secondary | ICD-10-CM

## 2018-12-17 ENCOUNTER — Other Ambulatory Visit: Payer: Self-pay

## 2018-12-17 ENCOUNTER — Ambulatory Visit
Admission: RE | Admit: 2018-12-17 | Discharge: 2018-12-17 | Disposition: A | Discharge status: Home / Self Care | Payer: 59 | Source: Ambulatory Visit | Attending: Obstetrics and Gynecology | Admitting: Obstetrics and Gynecology

## 2018-12-17 ENCOUNTER — Other Ambulatory Visit (HOSPITAL_COMMUNITY): Payer: Self-pay | Admitting: Diagnostic Radiology

## 2018-12-17 DIAGNOSIS — N6310 Unspecified lump in the right breast, unspecified quadrant: Secondary | ICD-10-CM | POA: Diagnosis not present

## 2018-12-17 DIAGNOSIS — R9389 Abnormal findings on diagnostic imaging of other specified body structures: Secondary | ICD-10-CM

## 2018-12-17 DIAGNOSIS — N6489 Other specified disorders of breast: Secondary | ICD-10-CM | POA: Diagnosis not present

## 2018-12-17 DIAGNOSIS — N6311 Unspecified lump in the right breast, upper outer quadrant: Secondary | ICD-10-CM | POA: Diagnosis not present

## 2018-12-17 HISTORY — PX: BREAST BIOPSY: SHX20

## 2018-12-17 MED ORDER — GADOBUTROL 1 MMOL/ML IV SOLN
8.0000 mL | Freq: Once | INTRAVENOUS | Status: AC | PRN
  Administered 2018-12-17: 8 mL via INTRAVENOUS

## 2018-12-21 DIAGNOSIS — A6920 Lyme disease, unspecified: Secondary | ICD-10-CM | POA: Diagnosis not present

## 2018-12-21 DIAGNOSIS — A440 Systemic bartonellosis: Secondary | ICD-10-CM | POA: Diagnosis not present

## 2018-12-21 DIAGNOSIS — B6000 Babesiosis, unspecified: Secondary | ICD-10-CM | POA: Diagnosis not present

## 2018-12-21 DIAGNOSIS — R5383 Other fatigue: Secondary | ICD-10-CM | POA: Diagnosis not present

## 2019-01-07 ENCOUNTER — Other Ambulatory Visit: Payer: Self-pay | Admitting: Rheumatology

## 2019-01-07 NOTE — Telephone Encounter (Signed)
Last Visit: 09/07/18 Next Visit: 03/08/19 UDS: 09/07/18 Narc Agreement: 09/07/18   Okay to refill Tramadol? 

## 2019-02-10 ENCOUNTER — Other Ambulatory Visit: Payer: Self-pay | Admitting: Rheumatology

## 2019-02-10 NOTE — Telephone Encounter (Signed)
Last Visit: 09/07/2018 Next Visit: 03/08/2019 UDS:09/07/2018 c/w Narc Agreement: 09/07/2018  Last fill: 01/07/2019   Okay to refill tramadol?

## 2019-03-02 DIAGNOSIS — E039 Hypothyroidism, unspecified: Secondary | ICD-10-CM | POA: Diagnosis not present

## 2019-03-02 DIAGNOSIS — Z8619 Personal history of other infectious and parasitic diseases: Secondary | ICD-10-CM | POA: Diagnosis not present

## 2019-03-02 DIAGNOSIS — B2700 Gammaherpesviral mononucleosis without complication: Secondary | ICD-10-CM | POA: Diagnosis not present

## 2019-03-08 ENCOUNTER — Ambulatory Visit: Payer: 59 | Admitting: Rheumatology

## 2019-04-12 ENCOUNTER — Other Ambulatory Visit: Payer: Self-pay

## 2019-04-12 ENCOUNTER — Other Ambulatory Visit: Payer: Self-pay | Admitting: Rheumatology

## 2019-04-12 DIAGNOSIS — Z5181 Encounter for therapeutic drug level monitoring: Secondary | ICD-10-CM | POA: Diagnosis not present

## 2019-04-12 DIAGNOSIS — G8929 Other chronic pain: Secondary | ICD-10-CM

## 2019-04-12 NOTE — Telephone Encounter (Signed)
Patient should get tramadol from the same physician was prescribed naltrexone.

## 2019-04-12 NOTE — Telephone Encounter (Signed)
Last Visit: 09/07/2018 Next Visit: 05/26/2019 UDS: 04/12/2019 (not resulted yet) Narc Agreement: 04/12/2019   Last fill: 02/10/2019   Okay to refill tramadol?

## 2019-04-12 NOTE — Telephone Encounter (Signed)
Attempted to contact patient and left message on machine to advise patient to call the office.  

## 2019-04-12 NOTE — Telephone Encounter (Signed)
Patient states the physician prescribing naltrexone is in [place] and he is unable to prescribe the tramadol due to law in the state of [place].

## 2019-04-14 LAB — PAIN MGMT, TRAMADOL W/MEDMATCH, U
Desmethyltramadol: 4164 ng/mL
Tramadol: >10000 ng/mL

## 2019-04-14 LAB — PAIN MGMT, PROFILE 5 W/CONF, U
Amphetamines: NEGATIVE ng/mL
Barbiturates: NEGATIVE ng/mL
Benzodiazepines: NEGATIVE ng/mL
Cocaine Metabolite: NEGATIVE ng/mL
Creatinine: 61.4 mg/dL
Marijuana Metabolite: NEGATIVE ng/mL
Methadone Metabolite: NEGATIVE ng/mL
Opiates: NEGATIVE ng/mL
Oxidant: NEGATIVE ug/mL
Oxycodone: NEGATIVE ng/mL
pH: 4.9 (ref 4.5–9.0)

## 2019-04-14 NOTE — Progress Notes (Signed)
Consistent with treatment

## 2019-05-03 DIAGNOSIS — L821 Other seborrheic keratosis: Secondary | ICD-10-CM | POA: Diagnosis not present

## 2019-05-03 DIAGNOSIS — L723 Sebaceous cyst: Secondary | ICD-10-CM | POA: Diagnosis not present

## 2019-05-03 DIAGNOSIS — D1801 Hemangioma of skin and subcutaneous tissue: Secondary | ICD-10-CM | POA: Diagnosis not present

## 2019-05-03 DIAGNOSIS — D485 Neoplasm of uncertain behavior of skin: Secondary | ICD-10-CM | POA: Diagnosis not present

## 2019-05-03 DIAGNOSIS — D2262 Melanocytic nevi of left upper limb, including shoulder: Secondary | ICD-10-CM | POA: Diagnosis not present

## 2019-05-03 DIAGNOSIS — L812 Freckles: Secondary | ICD-10-CM | POA: Diagnosis not present

## 2019-05-03 DIAGNOSIS — L813 Cafe au lait spots: Secondary | ICD-10-CM | POA: Diagnosis not present

## 2019-05-03 DIAGNOSIS — D2261 Melanocytic nevi of right upper limb, including shoulder: Secondary | ICD-10-CM | POA: Diagnosis not present

## 2019-05-03 DIAGNOSIS — L720 Epidermal cyst: Secondary | ICD-10-CM | POA: Diagnosis not present

## 2019-05-17 ENCOUNTER — Other Ambulatory Visit: Payer: Self-pay | Admitting: Rheumatology

## 2019-05-17 NOTE — Telephone Encounter (Signed)
Last Visit: 09/07/2018 Next Visit: 05/26/2019 UDS: 04/12/19 Narc Agreement: 04/12/19   Last Fill 04/12/19   Okay to refill Tramadol?

## 2019-05-19 DIAGNOSIS — M4696 Unspecified inflammatory spondylopathy, lumbar region: Secondary | ICD-10-CM | POA: Diagnosis not present

## 2019-05-19 DIAGNOSIS — M545 Low back pain: Secondary | ICD-10-CM | POA: Diagnosis not present

## 2019-05-19 NOTE — Progress Notes (Signed)
Office Visit Note  Patient: [name]             Date of Birth: [birth year]           MRN: [id number]             PCP: Maury Dus, MD Referring: Maury Dus, MD Visit Date: 05/26/2019 Occupation: @GUAROCC @  Subjective:  Back pain   History of Present Illness: YOLUNDA CHEVES is a 59 y.o. female with history of fibromyalgia and osteoarthritis.  Patient continues have generalized muscle aches and muscle tenderness due to fibromyalgia.  She states that she has been having more fibromyalgia flares this past winter.  She states that she has a history of cold intolerance due to thyroid disease which has been exacerbating her symptoms.  She continues to see a Lyme disease specialist on a regular basis.  She is also been seeing Dr. Cay Schillings for thoracic and lumbar spine pain.  She is scheduled for an MRI of the thoracic spine.  She is also been having increased discomfort in both knee joints especially when going down steps.  She denies any joint swelling.  She states that overall she has been sleeping well at night taking trazodone 150 mg at bedtime.  She continues to take tramadol 50 mg 2 tablets twice daily as needed which has been effective at managing her pain.    Activities of Daily Living:  Patient reports morning stiffness for 20 minutes.   Patient Denies nocturnal pain.  Difficulty dressing/grooming: Denies Difficulty climbing stairs: Reports Difficulty getting out of chair: Denies Difficulty using hands for taps, buttons, cutlery, and/or writing: Reports  Review of Systems  Constitutional: Positive for fatigue.  HENT: Positive for mouth dryness. Negative for mouth sores and nose dryness.   Eyes: Positive for itching and dryness. Negative for pain and visual disturbance.  Respiratory: Negative for cough, hemoptysis, shortness of breath and difficulty breathing.   Cardiovascular: Negative for chest pain, palpitations, hypertension and swelling in legs/feet.    Gastrointestinal: Negative for blood in stool, constipation and diarrhea.  Endocrine: Negative for increased urination.  Genitourinary: Negative for difficulty urinating and painful urination.  Musculoskeletal: Positive for arthralgias, joint pain, myalgias, morning stiffness, muscle tenderness and myalgias. Negative for joint swelling and muscle weakness.  Skin: Negative for color change, pallor, rash, hair loss, nodules/bumps, redness, skin tightness, ulcers and sensitivity to sunlight.  Allergic/Immunologic: Negative for susceptible to infections.  Neurological: Negative for dizziness, headaches, memory loss and weakness.  Hematological: Negative for swollen glands.  Psychiatric/Behavioral: Negative for depressed mood, confusion and sleep disturbance. The patient is not nervous/anxious.     PMFS History:  Patient Active Problem List   Diagnosis Date Noted  . Acute post-traumatic headache, not intractable 06/02/2018  . Fibromyalgia 06/05/2016  . Other insomnia 06/05/2016  . Other fatigue 06/05/2016  . DDD (degenerative disc disease), cervical 06/05/2016  . DDD (degenerative disc disease), lumbar 06/05/2016  . Osteopenia of multiple sites 06/05/2016  . History of asthma 06/05/2016  . History of Hashimoto thyroiditis 06/05/2016  . History of anxiety 06/05/2016  . Seasonal affective disorder ([place]) 06/05/2016  . Postmenopausal bleeding 12/02/2012    Class: Present on Admission  . Fibroids, submucosal 12/02/2012    Class: Present on Admission  . Hx of Lyme disease 06/02/2012  . Postural dizziness 08/08/2011  . Hyperparathyroidism, primary ([place]) 01/08/2011    Past Medical History:  Diagnosis Date  . Abnormal heart rate   . Abnormal uterine bleeding (AUB)   .  Allergic rhinitis   . Anxiety   . Asthma   . B12 deficiency   . Bruises easily   . Cat-scratch disease    h/o  . Chronic back pain    oa  . CMV (cytomegalovirus) ([place])    h/o  . Depression   . Fibrocystic breast  disease   . Fibroids   . Fibromyalgia   . GERD (gastroesophageal reflux disease)    watches diet  . H/O hiatal hernia   . H/O myomectomy   . Hashimoto thyroiditis   . History of babesiosis    PARASITITIS RESOLVED  . History of Bell's palsy    2000--  resolved  . History of concussion    age 36--  no residual  . History of hyperparathyroidism    PRIMARY--  S/P PARATHYROIDECTOMY 01-28-2011  . History of palpitations    stress induced  . History of uterine fibroid   . Hypoglycemia   . Hypothyroidism   . Lyme disease    CHRONIC --  FOR 22 YRS (SINCE APPROX 1996)  AND HX CAT SCRATCH DISEASE WHICH HAS RESOLVED  . Musculoskeletal chest pain    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  . Osteoarthritis    all joints secondary to lyme disease/  back is worse  . Vitamin D deficiency   . Wears contact lenses     Family History  Problem Relation Age of Onset  . Mitral valve prolapse Father   . Hypertension Father   . Dementia Father   . Hypothyroidism Mother   . Colon polyps Brother   . Aneurysm Brother   . Arthritis Brother   . Lupus Sister   . Rheum arthritis Sister   . Osteoarthritis Sister   . Cancer Maternal Aunt        breast  . Breast cancer Cousin    Past Surgical History:  Procedure Laterality Date  . COLONOSCOPY  12/2017  . DILATATION & CURRETTAGE/HYSTEROSCOPY WITH RESECTOCOPE N/A 12/02/2012   Procedure: DILATATION & CURETTAGE/HYSTEROSCOPY WITH RESECTOCOPE;  Surgeon: Darlyn Chamber, MD;  Location: [facility];  Service: Gynecology;  Laterality: N/A;  . GANGLION CYST EXCISION  2007   HAND  . HYSTEROSCOPIC MYOMECTOMY AND ABDOMINAL MYOMECTOMY  2006  . PARATHYROIDECTOMY  01/28/2011   Procedure: PARATHYROIDECTOMY;  Surgeon: Earnstine Regal, MD;  Location: [facility];  Service: General;  Laterality: N/A;   . STRESS ECHO REPORT  09-01-2009  DR [place]   NORMAL LVF/  EF 60%/  NORMAL HYPERDYNAMIC RESPONSE  . TILT TABLE STUDY  08-21-2011    DR KLEIN   BP WERE CONSISTENT 110-117  THROUGH OUT STUDY/ BASELINE HR 68-70  . TILT TABLE STUDY N/A 08/21/2011   Procedure: TILT TABLE STUDY;  Surgeon: Deboraha Sprang, MD;  Location: [facility] C[facility];  Service: Cardiovascular;  Laterality: N/A;  . TONSILLECTOMY  age 56   Social History   Social History Narrative   Lives at home with her husband and her adopted baby    Right handed   Caffeine: 2 cups daily     There is no immunization history on file for this patient.   Objective: Vital Signs: BP (!) 135/96 (BP Location: Left Arm, Patient Position: Sitting, Cuff Size: Normal)   Pulse 75   Resp 13   Ht 5\' 4"  (1.626 m)   Wt 203 lb 9.6 oz (92.4 kg)   LMP 01/22/2011   BMI 34.95 kg/m    Physical Exam Vitals and nursing note reviewed.  Constitutional:  Appearance: She is well-developed.  HENT:     Head: Normocephalic and atraumatic.  Eyes:     Conjunctiva/sclera: Conjunctivae normal.  Pulmonary:     Effort: Pulmonary effort is normal.  Abdominal:     General: Bowel sounds are normal.     Palpations: Abdomen is soft.  Musculoskeletal:     Cervical back: Normal range of motion.  Lymphadenopathy:     Cervical: No cervical adenopathy.  Skin:    General: Skin is warm and dry.     Capillary Refill: Capillary refill takes less than 2 seconds.  Neurological:     Mental Status: She is alert and oriented to person, place, and time.  Psychiatric:        Behavior: Behavior normal.      Musculoskeletal Exam: Generalized hyperalgesia and positive tender points.  C-spine has good range of motion.  Thoracic and lumbar spine painful range of motion.  Midline spinal tenderness in the thoracic and lumbar spine.  Shoulder joints, elbow joints, wrist joints, MCPs, PIPs, DIPs good range of motion with no synovitis.  She has complete fist formation bilaterally.  PIP and DIP thickening consistent with osteoarthritis of both hands.  Hip joints have good range of motion with some discomfort in the right hip.  Knee joints have good range  of motion with no warmth or effusion.  Ankle joints have good range of motion with no tenderness or inflammation.  She has PIP and DIP thickening consistent with osteoarthritis in both feet.  CDAI Exam: CDAI Score: -- Patient Global: --; Provider Global: -- Swollen: --; Tender: -- Joint Exam 05/26/2019   No joint exam has been documented for this visit   There is currently no information documented on the homunculus. Go to the Rheumatology activity and complete the homunculus joint exam.  Investigation: No additional findings.  Imaging: No results found.  Recent Labs: Lab Results  Component Value Date   WBC 4.9 06/19/2016   HGB 14.3 06/19/2016   PLT 185 06/19/2016   NA 139 06/19/2016   K 4.4 06/19/2016   CL 107 06/19/2016   CO2 26 06/19/2016   GLUCOSE 92 06/19/2016   BUN 15 06/19/2016   CREATININE 0.91 06/19/2016   BILITOT 0.3 06/19/2016   ALKPHOS 68 06/19/2016   AST 22 06/19/2016   ALT 26 06/19/2016   PROT 6.8 06/19/2016   ALBUMIN 4.5 06/19/2016   CALCIUM 9.3 06/19/2016   GFRAA 83 06/19/2016    Speciality Comments: No specialty comments available.  Procedures:  No procedures performed Allergies: Contrast media [iodinated diagnostic agents], Levothyroxine, Tindamax [tinidazole], Flagyl [metronidazole hcl], and Tinidazole   Assessment / Plan:     Visit Diagnoses: Fibromyalgia: She has generalized hyperalgesia and positive tender points on exam.  She has been having more frequent and severe fibromyalgia flares this past winter which she attributes to her cold intolerance related to underlying Hashimoto's.  She continues to take tramadol 50 mg 2 tablets twice daily as needed.  She continues to take Cymbalta and gabapentin as prescribed.  She has chronic fatigue which she attributes to Lyme disease and fibromyalgia.  We discussed the importance of regular exercise and good sleep hygiene.  She has been sleeping well at night taking trazodone 150 mg at bedtime.  She will  follow-up in the office in 6 months.  Other fatigue - Chronic and related to Lyme's disease.   Other insomnia - She takes Trazodone 150 mg at bedtime and Gabapentin 300 mg by mouth at bedtime.  She has been sleeping well at night recently.  Trochanteric bursitis of both hips: She has tenderness over bilateral trochanteric bursa.  She was encouraged to perform stretching exercises on a daily basis.  DDD (degenerative disc disease), cervical: She has good range of motion with no discomfort.  She has no symptoms of radiculopathy at this time.  DDD (degenerative disc disease), lumbar: She has been experiencing increased thoracic and lumbar spine pain.  She has been followed by Dr. Salvadore Dom and is scheduled for a MRI.  She continues to take tramadol 50 mg 2 tablets twice daily as needed for pain relief.  Other chronic pain -  Tramadol 50 mg 1 to 2 tablets BID prn. UDS & narcotic agreement: 04/12/2019.  She has found this dose of tramadol to be effective.  She plans on trying to reduce the dose of tramadol as the weather begins to warm up.  Osteopenia of multiple sites: According to the patient Dr. Radene Knee orders DEXAs.  Other medical conditions are listed as follows:  History of Hashimoto thyroiditis  History of hyperparathyroidism  History of anxiety  History of asthma  Orders: No orders of the defined types were placed in this encounter.  No orders of the defined types were placed in this encounter.     Follow-Up Instructions: Return in about 6 months (around 11/25/2019) for Fibromyalgia.   Ofilia Neas, PA-C  Note - This record has been created using Dragon software.  Chart creation errors have been sought, but may not always  have been located. Such creation errors do not reflect on  the standard of medical care.

## 2019-05-26 ENCOUNTER — Encounter: Payer: Self-pay | Admitting: Physician Assistant

## 2019-05-26 ENCOUNTER — Ambulatory Visit (INDEPENDENT_AMBULATORY_CARE_PROVIDER_SITE_OTHER): Payer: 59 | Admitting: Physician Assistant

## 2019-05-26 ENCOUNTER — Other Ambulatory Visit: Payer: Self-pay

## 2019-05-26 VITALS — BP 135/96 | HR 75 | Resp 13 | Ht 64.0 in | Wt 203.6 lb

## 2019-05-26 DIAGNOSIS — M5136 Other intervertebral disc degeneration, lumbar region: Secondary | ICD-10-CM

## 2019-05-26 DIAGNOSIS — M51369 Other intervertebral disc degeneration, lumbar region without mention of lumbar back pain or lower extremity pain: Secondary | ICD-10-CM

## 2019-05-26 DIAGNOSIS — G4709 Other insomnia: Secondary | ICD-10-CM

## 2019-05-26 DIAGNOSIS — Z8709 Personal history of other diseases of the respiratory system: Secondary | ICD-10-CM

## 2019-05-26 DIAGNOSIS — M7062 Trochanteric bursitis, left hip: Secondary | ICD-10-CM

## 2019-05-26 DIAGNOSIS — Z8639 Personal history of other endocrine, nutritional and metabolic disease: Secondary | ICD-10-CM

## 2019-05-26 DIAGNOSIS — R5383 Other fatigue: Secondary | ICD-10-CM

## 2019-05-26 DIAGNOSIS — M5030 Other cervical disc degeneration, unspecified cervical region: Secondary | ICD-10-CM

## 2019-05-26 DIAGNOSIS — G8929 Other chronic pain: Secondary | ICD-10-CM

## 2019-05-26 DIAGNOSIS — M797 Fibromyalgia: Secondary | ICD-10-CM | POA: Diagnosis not present

## 2019-05-26 DIAGNOSIS — M7061 Trochanteric bursitis, right hip: Secondary | ICD-10-CM | POA: Diagnosis not present

## 2019-05-26 DIAGNOSIS — M8589 Other specified disorders of bone density and structure, multiple sites: Secondary | ICD-10-CM

## 2019-05-26 DIAGNOSIS — Z8659 Personal history of other mental and behavioral disorders: Secondary | ICD-10-CM | POA: Diagnosis not present

## 2019-06-07 DIAGNOSIS — M545 Low back pain: Secondary | ICD-10-CM | POA: Diagnosis not present

## 2019-06-09 DIAGNOSIS — A440 Systemic bartonellosis: Secondary | ICD-10-CM | POA: Diagnosis not present

## 2019-06-09 DIAGNOSIS — R5383 Other fatigue: Secondary | ICD-10-CM | POA: Diagnosis not present

## 2019-06-09 DIAGNOSIS — B6009 Other babesiosis: Secondary | ICD-10-CM | POA: Diagnosis not present

## 2019-06-09 DIAGNOSIS — A6920 Lyme disease, unspecified: Secondary | ICD-10-CM | POA: Diagnosis not present

## 2019-06-14 DIAGNOSIS — M25561 Pain in right knee: Secondary | ICD-10-CM | POA: Diagnosis not present

## 2019-06-14 DIAGNOSIS — M545 Low back pain: Secondary | ICD-10-CM | POA: Diagnosis not present

## 2019-06-21 ENCOUNTER — Other Ambulatory Visit: Payer: Self-pay | Admitting: Physician Assistant

## 2019-06-21 NOTE — Telephone Encounter (Signed)
Last Visit: 05/26/2019 Next Visit: 11/23/2019 UDS:04/12/2019 c/w Narc Agreement: 04/12/2019   Last fill: 05/17/2019   Okay to refill tramadol?

## 2019-06-28 ENCOUNTER — Other Ambulatory Visit: Payer: Self-pay | Admitting: Physician Assistant

## 2019-08-03 DIAGNOSIS — N1831 Chronic kidney disease, stage 3a: Secondary | ICD-10-CM | POA: Diagnosis not present

## 2019-08-04 ENCOUNTER — Other Ambulatory Visit: Payer: Self-pay | Admitting: Physician Assistant

## 2019-08-04 NOTE — Telephone Encounter (Signed)
Last Visit: 05/26/2019 Next Visit: 11/23/2019 UDS:04/12/2019 c/w Narc Agreement: 04/12/2019   Last fill: 06/21/2019  Okay to refill tramadol?

## 2019-08-17 DIAGNOSIS — Z1231 Encounter for screening mammogram for malignant neoplasm of breast: Secondary | ICD-10-CM | POA: Diagnosis not present

## 2019-08-23 ENCOUNTER — Other Ambulatory Visit: Payer: Self-pay | Admitting: Obstetrics and Gynecology

## 2019-08-23 DIAGNOSIS — R928 Other abnormal and inconclusive findings on diagnostic imaging of breast: Secondary | ICD-10-CM

## 2019-09-09 ENCOUNTER — Other Ambulatory Visit: Payer: Self-pay | Admitting: Physician Assistant

## 2019-09-09 NOTE — Telephone Encounter (Signed)
Last Visit:05/26/2019 Next Visit:11/23/2019 UDS:04/12/2019 c/w Narc Agreement:04/12/2019   Last fill: 08/04/2019  Okay to refill Tramadol?

## 2019-09-09 NOTE — Telephone Encounter (Signed)
I left a message on answering machine for patient to call back.  I would like her to reduce the tramadol dose that she is on high-dose Cymbalta.  It also interferes with naltrexone.

## 2019-09-15 NOTE — Telephone Encounter (Signed)
Attempted to contact the patient and left message for patient to call the office.  

## 2019-09-20 ENCOUNTER — Other Ambulatory Visit: Payer: Self-pay | Admitting: Physician Assistant

## 2019-09-22 ENCOUNTER — Other Ambulatory Visit: Payer: Self-pay | Admitting: *Deleted

## 2019-09-22 MED ORDER — TRAMADOL HCL 50 MG PO TABS: 50.0000 mg | ORAL_TABLET | Freq: Two times a day (BID) | ORAL | 0 refills | Status: DC | PRN

## 2019-09-22 NOTE — Telephone Encounter (Signed)
I returned patient's call and had a detailed discussion.  She takes naltrexone for Lyme disease which is prescribed by her physician from [place].  She is also on trazodone and Cymbalta.  I advised her that these medications interfere with the tramadol.  I am hesitant to refill tramadol prescriptions in the future.  She requested only one-time refill on tramadol for next 30 days.  She states she has been diagnosed with a spinal stenosis and will ask her orthopedic surgeon to take over the tramadol prescription.  I agreed to give her tramadol 50 mg 1 tablet p.o. twice daily as needed.  Total 60 tablets with no refills.  Please send a new prescription.

## 2019-09-22 NOTE — Telephone Encounter (Signed)
Patient contacted the office requesting a refill on Tramadol.  Per phone note on 09/09/2019 patient advised Dr. Estanislado Pandy would like her to reduce the tramadol dose that she is on high-dose Cymbalta.  It also interferes with naltrexone. Patient does not want to reduce her dose. Patient states she has spinal stenosis which contributes to her pain.    Last Visit:05/26/2019 Next Visit:11/23/2019 UDS:04/12/2019 c/w Narc Agreement:04/12/2019   Last fill: 08/04/2019  Okay to refill Tramadol?

## 2019-09-27 ENCOUNTER — Other Ambulatory Visit: Payer: Medicare Other

## 2019-09-28 DIAGNOSIS — A6920 Lyme disease, unspecified: Secondary | ICD-10-CM | POA: Diagnosis not present

## 2019-09-28 DIAGNOSIS — B6000 Babesiosis, unspecified: Secondary | ICD-10-CM | POA: Diagnosis not present

## 2019-09-28 DIAGNOSIS — R5383 Other fatigue: Secondary | ICD-10-CM | POA: Diagnosis not present

## 2019-10-06 ENCOUNTER — Ambulatory Visit: Payer: 59

## 2019-10-06 ENCOUNTER — Other Ambulatory Visit: Payer: Self-pay

## 2019-10-06 ENCOUNTER — Ambulatory Visit: Payer: Medicare Other

## 2019-10-19 ENCOUNTER — Other Ambulatory Visit: Payer: Self-pay

## 2019-10-19 ENCOUNTER — Other Ambulatory Visit: Payer: Self-pay | Admitting: Obstetrics and Gynecology

## 2019-10-19 ENCOUNTER — Ambulatory Visit
Admission: RE | Admit: 2019-10-19 | Discharge: 2019-10-19 | Disposition: A | Discharge status: Home / Self Care | Payer: 59 | Source: Ambulatory Visit | Attending: Obstetrics and Gynecology | Admitting: Obstetrics and Gynecology

## 2019-10-19 DIAGNOSIS — R928 Other abnormal and inconclusive findings on diagnostic imaging of breast: Secondary | ICD-10-CM

## 2019-10-19 DIAGNOSIS — N6489 Other specified disorders of breast: Secondary | ICD-10-CM

## 2019-10-24 ENCOUNTER — Other Ambulatory Visit: Payer: Self-pay | Admitting: Rheumatology

## 2019-10-25 DIAGNOSIS — R358 Other polyuria: Secondary | ICD-10-CM | POA: Diagnosis not present

## 2019-10-25 DIAGNOSIS — R631 Polydipsia: Secondary | ICD-10-CM | POA: Diagnosis not present

## 2019-10-25 DIAGNOSIS — Z8639 Personal history of other endocrine, nutritional and metabolic disease: Secondary | ICD-10-CM | POA: Diagnosis not present

## 2019-11-02 ENCOUNTER — Other Ambulatory Visit: Payer: Self-pay | Admitting: Rheumatology

## 2019-11-03 DIAGNOSIS — Z131 Encounter for screening for diabetes mellitus: Secondary | ICD-10-CM | POA: Diagnosis not present

## 2019-11-03 DIAGNOSIS — R631 Polydipsia: Secondary | ICD-10-CM | POA: Diagnosis not present

## 2019-11-03 DIAGNOSIS — R358 Other polyuria: Secondary | ICD-10-CM | POA: Diagnosis not present

## 2019-11-03 DIAGNOSIS — Z8639 Personal history of other endocrine, nutritional and metabolic disease: Secondary | ICD-10-CM | POA: Diagnosis not present

## 2019-11-04 ENCOUNTER — Other Ambulatory Visit: Payer: Self-pay | Admitting: Obstetrics and Gynecology

## 2019-11-04 ENCOUNTER — Other Ambulatory Visit: Payer: Self-pay | Admitting: Rheumatology

## 2019-11-04 DIAGNOSIS — Z803 Family history of malignant neoplasm of breast: Secondary | ICD-10-CM

## 2019-11-04 NOTE — Telephone Encounter (Signed)
Patient calling to see why her refill on Tramadol was refused. Patient uses [facility] on [address]. Please call to advise.

## 2019-11-04 NOTE — Telephone Encounter (Signed)
Attempted to contact the patient and left message for patient to call the office.  

## 2019-11-05 MED ORDER — TRAMADOL HCL 50 MG PO TABS
50.0000 mg | ORAL_TABLET | Freq: Two times a day (BID) | ORAL | 0 refills | Status: DC | PRN
Start: 2019-11-05 — End: 2020-01-05

## 2019-11-05 NOTE — Addendum Note (Signed)
Addended by: Carole Binning on: 11/05/2019 10:50 AM   Modules accepted: Orders

## 2019-11-05 NOTE — Telephone Encounter (Signed)
Patient advised her prescription was refused based off phone note on 09/22/2019.    returned patient's call and had a detailed discussion.  She takes naltrexone for Lyme disease which is prescribed by her physician from [place].  She is also on trazodone and Cymbalta.  I advised her that these medications interfere with the tramadol.  I am hesitant to refill tramadol prescriptions in the future.  She requested only one-time refill on tramadol for next 30 days.  She states she has been diagnosed with a spinal stenosis and will ask her orthopedic surgeon to take over the tramadol prescription.  I agreed to give her tramadol 50 mg 1 tablet p.o. twice daily as needed.  Total 60 tablets with no refills.    Patient states she has not been able to make an appointment yet and will call to schedule today. Patient is requesting one more refill.   Last Visit:05/26/2019 Next Visit:11/23/2019 UDS:04/12/2019 c/w Narc Agreement:04/12/2019   Last fill:09/22/2019  Okay to refill Tramadol?

## 2019-11-05 NOTE — Telephone Encounter (Signed)
Patient advised Dr. Estanislado Pandy refilled her prescription for one last refill. Patient advised to have the prescription transferred to her Orthopedic or her PCP. Patient verbalized understanding.

## 2019-11-09 NOTE — Progress Notes (Signed)
Office Visit Note  Patient: [name]             Date of Birth: Sep 06, 1960           MRN: [id number]             PCP: Maury Dus, MD Referring: Maury Dus, MD Visit Date: 11/23/2019 Occupation: @GUAROCC @  Subjective:  Pain in multiple joints.   History of Present Illness: [name] is a 59 y.o. female with history of fibromyalgia, osteoarthritis and degenerative disc disease.  She states she continues to have a lot of pain and discomfort in her lower back.  She also has discomfort in her bilateral trochanteric bursa and has increased discomfort when she walks and sleeps on her side.  Her neck is not currently bothering her.  Fibromyalgia is fairly well controlled.  Has been followed by her doctor in [place] for Lyme disease.  She states she started SOT treatment.  Activities of Daily Living:  Patient reports morning stiffness for 5 minutes.   Patient Denies nocturnal pain.  Difficulty dressing/grooming: Denies Difficulty climbing stairs: Reports Difficulty getting out of chair: Denies Difficulty using hands for taps, buttons, cutlery, and/or writing: Denies  Review of Systems  Constitutional: Positive for fatigue. Negative for night sweats, weight gain and weight loss.  HENT: Negative for mouth sores, trouble swallowing, trouble swallowing, mouth dryness and nose dryness.   Eyes: Positive for dryness. Negative for pain, redness and visual disturbance.  Respiratory: Negative for cough, shortness of breath and difficulty breathing.   Cardiovascular: Negative for chest pain, palpitations, hypertension, irregular heartbeat and swelling in legs/feet.  Gastrointestinal: Negative for blood in stool, constipation and diarrhea.  Endocrine: Negative for increased urination.  Genitourinary: Negative for vaginal dryness.  Musculoskeletal: Positive for arthralgias, joint pain, myalgias, morning stiffness and myalgias. Negative for joint swelling, muscle weakness and  muscle tenderness.  Skin: Negative for color change, rash, hair loss, skin tightness, ulcers and sensitivity to sunlight.  Allergic/Immunologic: Negative for susceptible to infections.  Neurological: Negative for dizziness, memory loss, night sweats and weakness.  Hematological: Negative for swollen glands.  Psychiatric/Behavioral: Negative for depressed mood and sleep disturbance. The patient is not nervous/anxious.     PMFS History:  Patient Active Problem List   Diagnosis Date Noted  . Acute post-traumatic headache, not intractable 06/02/2018  . Fibromyalgia 06/05/2016  . Other insomnia 06/05/2016  . Other fatigue 06/05/2016  . DDD (degenerative disc disease), cervical 06/05/2016  . DDD (degenerative disc disease), lumbar 06/05/2016  . Osteopenia of multiple sites 06/05/2016  . History of asthma 06/05/2016  . History of Hashimoto thyroiditis 06/05/2016  . History of anxiety 06/05/2016  . Seasonal affective disorder ([place]) 06/05/2016  . Postmenopausal bleeding 12/02/2012    Class: Present on Admission  . Fibroids, submucosal 12/02/2012    Class: Present on Admission  . Hx of Lyme disease 06/02/2012  . Postural dizziness 08/08/2011  . Hyperparathyroidism, primary ([place]) 01/08/2011    Past Medical History:  Diagnosis Date  . Abnormal heart rate   . Abnormal uterine bleeding (AUB)   . Allergic rhinitis   . Anxiety   . Asthma   . B12 deficiency   . Bruises easily   . Cat-scratch disease    h/o  . Chronic back pain    oa  . CMV (cytomegalovirus) ([place])    h/o  . Depression   . Fibrocystic breast disease   . Fibroids   . Fibromyalgia   . GERD (gastroesophageal  reflux disease)    watches diet  . H/O hiatal hernia   . H/O myomectomy   . Hashimoto thyroiditis   . History of babesiosis    PARASITITIS RESOLVED  . History of Bell's palsy    2000--  resolved  . History of concussion    age 80--  no residual  . History of hyperparathyroidism    PRIMARY--  S/P  PARATHYROIDECTOMY 01-28-2011  . History of palpitations    stress induced  . History of uterine fibroid   . Hypoglycemia   . Hypothyroidism   . Lyme disease    CHRONIC --  FOR 22 YRS (SINCE APPROX 1996)  AND HX CAT SCRATCH DISEASE WHICH HAS RESOLVED  . Musculoskeletal chest pain    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  . Osteoarthritis    all joints secondary to lyme disease/  back is worse  . Vitamin D deficiency   . Wears contact lenses     Family History  Problem Relation Age of Onset  . Mitral valve prolapse Father   . Hypertension Father   . Dementia Father   . Hypothyroidism Mother   . Colon polyps Brother   . Aneurysm Brother   . Arthritis Brother   . Lupus Sister   . Rheum arthritis Sister   . Osteoarthritis Sister   . Cancer Maternal Aunt        breast  . Breast cancer Cousin    Past Surgical History:  Procedure Laterality Date  . COLONOSCOPY  12/2017  . DILATATION & CURRETTAGE/HYSTEROSCOPY WITH RESECTOCOPE N/A 12/02/2012   Procedure: DILATATION & CURETTAGE/HYSTEROSCOPY WITH RESECTOCOPE;  Surgeon: Darlyn Chamber, MD;  Location: [place];  Service: Gynecology;  Laterality: N/A;  . GANGLION CYST EXCISION  2007   HAND  . HYSTEROSCOPIC MYOMECTOMY AND ABDOMINAL MYOMECTOMY  2006  . PARATHYROIDECTOMY  01/28/2011   Procedure: PARATHYROIDECTOMY;  Surgeon: Earnstine Regal, MD;  Location: [facility];  Service: General;  Laterality: N/A;   . STRESS ECHO REPORT  09-01-2009  DR [place]   NORMAL LVF/  EF 60%/  NORMAL HYPERDYNAMIC RESPONSE  . TILT TABLE STUDY  08-21-2011    DR KLEIN   BP WERE CONSISTENT 110-117 THROUGH OUT STUDY/ BASELINE HR 68-70  . TILT TABLE STUDY N/A 08/21/2011   Procedure: TILT TABLE STUDY;  Surgeon: Deboraha Sprang, MD;  Location: [facility];  Service: Cardiovascular;  Laterality: N/A;  . TONSILLECTOMY  age 32   Social History   Social History Narrative   Lives at home with her husband and her adopted baby    Right handed   Caffeine: 2 cups daily      There is no immunization history on file for this patient.   Objective: Vital Signs: BP 138/90 (BP Location: Left Arm, Patient Position: Sitting, Cuff Size: Normal)   Pulse 81   Resp 16   Ht 5' 4.5" (1.638 m)   Wt 199 lb 12.8 oz (90.6 kg)   LMP 01/22/2011   BMI 33.77 kg/m    Physical Exam Vitals and nursing note reviewed.  Constitutional:      Appearance: She is well-developed.  HENT:     Head: Normocephalic and atraumatic.  Eyes:     Conjunctiva/sclera: Conjunctivae normal.  Cardiovascular:     Rate and Rhythm: Normal rate and regular rhythm.     Heart sounds: Normal heart sounds.  Pulmonary:     Effort: Pulmonary effort is normal.     Breath sounds: Normal breath  sounds.  Abdominal:     General: Bowel sounds are normal.     Palpations: Abdomen is soft.  Musculoskeletal:     Cervical back: Normal range of motion.  Lymphadenopathy:     Cervical: No cervical adenopathy.  Skin:    General: Skin is warm and dry.     Capillary Refill: Capillary refill takes less than 2 seconds.  Neurological:     Mental Status: She is alert and oriented to person, place, and time.  Psychiatric:        Behavior: Behavior normal.      Musculoskeletal Exam: She has some discomfort range of motion of her cervical spine.  Lumbar spine was in good range of motion.  She had some tenderness over SI joints.  Shoulder joints, elbow joints, wrist joints, MCPs PIPs and DIPs good range of motion with no synovitis.  Hip joints, knee joints, ankles, MTPs and PIPs with good range of motion.  She has some tenderness over bilateral trochanteric bursa consistent with trochanteric bursitis.  CDAI Exam: CDAI Score: -- Patient Global: --; Provider Global: -- Swollen: --; Tender: -- Joint Exam 11/23/2019   No joint exam has been documented for this visit   There is currently no information documented on the homunculus. Go to the Rheumatology activity and complete the homunculus joint  exam.  Investigation: No additional findings.  Imaging: No results found.  Recent Labs: Lab Results  Component Value Date   WBC 4.9 06/19/2016   HGB 14.3 06/19/2016   PLT 185 06/19/2016   NA 139 06/19/2016   K 4.4 06/19/2016   CL 107 06/19/2016   CO2 26 06/19/2016   GLUCOSE 92 06/19/2016   BUN 15 06/19/2016   CREATININE 0.91 06/19/2016   BILITOT 0.3 06/19/2016   ALKPHOS 68 06/19/2016   AST 22 06/19/2016   ALT 26 06/19/2016   PROT 6.8 06/19/2016   ALBUMIN 4.5 06/19/2016   CALCIUM 9.3 06/19/2016   GFRAA 83 06/19/2016    Speciality Comments: No specialty comments available.  Procedures:  No procedures performed Allergies: Contrast media [iodinated diagnostic agents], Levothyroxine, Tindamax [tinidazole], Flagyl [metronidazole hcl], and Tinidazole   Assessment / Plan:     Visit Diagnoses: Fibromyalgia-patient states her fibromyalgia symptoms are better.  She is not having much discomfort in her muscles.  She had few tender points only on examination.  Other insomnia - Trazodone 150 mg at bedtime and Gabapentin 300 mg by mouth at bedtime.   Other fatigue - Chronic and related to Lyme's disease.  She has been followed by her doctor at [place] for Lyme disease.    Trochanteric bursitis of both hips-she had tenderness over bilateral trochanteric bursa.  She continues to have discomfort in trochanteric area.  We will refer her to physical therapy.  I have also given her a handout on IT band exercises.  DDD (degenerative disc disease), cervical-she had some stiffness with range of motion.  The discomfort is better.  DDD (degenerative disc disease), lumbar-she continues to have lower back pain and discomfort.  I will refer her to physical therapy.  Handout on exercises was given.  I will also refer her to pain clinic.  Other chronic pain - tramadol. UDS & narcotic agreement: 04/12/2019 (tramadol refilled for the last time on 11/05/2019, she is to transfer rx to PCP or  orthopedist).  We will refer her to pain clinic.  Osteopenia of multiple sites - According to the patient Dr. Radene Knee orders DEXAs.  History of hyperparathyroidism  History of Hashimoto thyroiditis  History of anxiety  History of asthma  Weight gain-patient is concerned about weight gain.  Weight loss diet and exercise was discussed.  I will also given her information regarding weight loss clinic.  Educated about COVID-19 virus infection-she has not received vaccination yet.  She states she is waiting to hear from her [place] physician regarding which vaccine to receive.  Use of mask, social distancing and hand hygiene was discussed.  Orders: Orders Placed This Encounter  Procedures  . Ambulatory referral to Physical Therapy  . Ambulatory referral to Pain Clinic   No orders of the defined types were placed in this encounter.     Follow-Up Instructions: Return in about 6 months (around 05/22/2020) for FMS, DDD.   Bo Merino, MD  Note - This record has been created using Editor, commissioning.  Chart creation errors have been sought, but may not always  have been located. Such creation errors do not reflect on  the standard of medical care.

## 2019-11-23 ENCOUNTER — Encounter: Payer: Self-pay | Admitting: Rheumatology

## 2019-11-23 ENCOUNTER — Ambulatory Visit: Payer: Medicare Other | Admitting: Physician Assistant

## 2019-11-23 ENCOUNTER — Other Ambulatory Visit: Payer: Self-pay

## 2019-11-23 ENCOUNTER — Ambulatory Visit (INDEPENDENT_AMBULATORY_CARE_PROVIDER_SITE_OTHER): Payer: 59 | Admitting: Rheumatology

## 2019-11-23 VITALS — BP 138/90 | HR 81 | Resp 16 | Ht 64.5 in | Wt 199.8 lb

## 2019-11-23 DIAGNOSIS — G4709 Other insomnia: Secondary | ICD-10-CM | POA: Diagnosis not present

## 2019-11-23 DIAGNOSIS — M7061 Trochanteric bursitis, right hip: Secondary | ICD-10-CM

## 2019-11-23 DIAGNOSIS — M797 Fibromyalgia: Secondary | ICD-10-CM

## 2019-11-23 DIAGNOSIS — Z8709 Personal history of other diseases of the respiratory system: Secondary | ICD-10-CM | POA: Diagnosis not present

## 2019-11-23 DIAGNOSIS — G8929 Other chronic pain: Secondary | ICD-10-CM

## 2019-11-23 DIAGNOSIS — Z8639 Personal history of other endocrine, nutritional and metabolic disease: Secondary | ICD-10-CM | POA: Diagnosis not present

## 2019-11-23 DIAGNOSIS — M7062 Trochanteric bursitis, left hip: Secondary | ICD-10-CM

## 2019-11-23 DIAGNOSIS — R5383 Other fatigue: Secondary | ICD-10-CM | POA: Diagnosis not present

## 2019-11-23 DIAGNOSIS — Z8659 Personal history of other mental and behavioral disorders: Secondary | ICD-10-CM

## 2019-11-23 DIAGNOSIS — M5136 Other intervertebral disc degeneration, lumbar region: Secondary | ICD-10-CM | POA: Diagnosis not present

## 2019-11-23 DIAGNOSIS — M8589 Other specified disorders of bone density and structure, multiple sites: Secondary | ICD-10-CM

## 2019-11-23 DIAGNOSIS — M5030 Other cervical disc degeneration, unspecified cervical region: Secondary | ICD-10-CM

## 2019-11-23 DIAGNOSIS — Z7189 Other specified counseling: Secondary | ICD-10-CM | POA: Diagnosis not present

## 2019-11-23 DIAGNOSIS — R635 Abnormal weight gain: Secondary | ICD-10-CM

## 2019-11-23 NOTE — Patient Instructions (Signed)
Iliotibial Band Syndrome Rehab Ask your health care provider which exercises are safe for you. Do exercises exactly as told by your health care provider and adjust them as directed. It is normal to feel mild stretching, pulling, tightness, or discomfort as you do these exercises. Stop right away if you feel sudden pain or your pain gets significantly worse. Do not begin these exercises until told by your health care provider. Stretching and range-of-motion exercises These exercises warm up your muscles and joints and improve the movement and flexibility of your hip and pelvis. Quadriceps stretch, prone  1. Lie on your abdomen on a firm surface, such as a bed or padded floor (prone position). 2. Bend your left / right knee and reach back to hold your ankle or pant leg. If you cannot reach your ankle or pant leg, loop a belt around your foot and grab the belt instead. 3. Gently pull your heel toward your buttocks. Your knee should not slide out to the side. You should feel a stretch in the front of your thigh and knee (quadriceps). 4. Hold this position for __________ seconds. Repeat __________ times. Complete this exercise __________ times a day. Iliotibial band stretch An iliotibial band is a strong band of muscle tissue that runs from the outer side of your hip to the outer side of your thigh and knee. 1. Lie on your side with your left / right leg in the top position. 2. Bend both of your knees and grab your left / right ankle. Stretch out your bottom arm to help you balance. 3. Slowly bring your top knee back so your thigh goes behind your trunk. 4. Slowly lower your top leg toward the floor until you feel a gentle stretch on the outside of your left / right hip and thigh. If you do not feel a stretch and your knee will not fall farther, place the heel of your other foot on top of your knee and pull your knee down toward the floor with your foot. 5. Hold this position for __________  seconds. Repeat __________ times. Complete this exercise __________ times a day. Strengthening exercises These exercises build strength and endurance in your hip and pelvis. Endurance is the ability to use your muscles for a long time, even after they get tired. Straight leg raises, side-lying This exercise strengthens the muscles that rotate the leg at the hip and move it away from your body (hip abductors). 1. Lie on your side with your left / right leg in the top position. Lie so your head, shoulder, hip, and knee line up. You may bend your bottom knee to help you balance. 2. Roll your hips slightly forward so your hips are stacked directly over each other and your left / right knee is facing forward. 3. Tense the muscles in your outer thigh and lift your top leg 4-6 inches (10-15 cm). 4. Hold this position for __________ seconds. 5. Slowly return to the starting position. Let your muscles relax completely before doing another repetition. Repeat __________ times. Complete this exercise __________ times a day. Leg raises, prone This exercise strengthens the muscles that move the hips (hip extensors). 1. Lie on your abdomen on your bed or a firm surface. You can put a pillow under your hips if that is more comfortable for your lower back. 2. Bend your left / right knee so your foot is straight up in the air. 3. Squeeze your buttocks muscles and lift your left / right thigh   off the bed. Do not let your back arch. 4. Tense your thigh muscle as hard as you can without increasing any knee pain. 5. Hold this position for __________ seconds. 6. Slowly lower your leg to the starting position and allow it to relax completely. Repeat __________ times. Complete this exercise __________ times a day. Hip hike 1. Stand sideways on a bottom step. Stand on your left / right leg with your other foot unsupported next to the step. You can hold on to the railing or wall for balance if needed. 2. Keep your knees  straight and your torso square. Then lift your left / right hip up toward the ceiling. 3. Slowly let your left / right hip lower toward the floor, past the starting position. Your foot should get closer to the floor. Do not lean or bend your knees. Repeat __________ times. Complete this exercise __________ times a day. This information is not intended to replace advice given to you by your health care provider. Make sure you discuss any questions you have with your health care provider. Document Revised: 06/04/2018 Document Reviewed: 12/03/2017 Elsevier Patient Education  2020 [employer] Back Exercises The following exercises strengthen the muscles that help to support the trunk and back. They also help to keep the lower back flexible. Doing these exercises can help to prevent back pain or lessen existing pain.  If you have back pain or discomfort, try doing these exercises 2-3 times each day or as told by your health care provider.  As your pain improves, do them once each day, but increase the number of times that you repeat the steps for each exercise (do more repetitions).  To prevent the recurrence of back pain, continue to do these exercises once each day or as told by your health care provider. Do exercises exactly as told by your health care provider and adjust them as directed. It is normal to feel mild stretching, pulling, tightness, or discomfort as you do these exercises, but you should stop right away if you feel sudden pain or your pain gets worse. Exercises Single knee to chest Repeat these steps 3-5 times for each leg: 1. Lie on your back on a firm bed or the floor with your legs extended. 2. Bring one knee to your chest. Your other leg should stay extended and in contact with the floor. 3. Hold your knee in place by grabbing your knee or thigh with both hands and hold. 4. Pull on your knee until you feel a gentle stretch in your lower back or buttocks. 5. Hold the stretch  for 10-30 seconds. 6. Slowly release and straighten your leg. Pelvic tilt Repeat these steps 5-10 times: 1. Lie on your back on a firm bed or the floor with your legs extended. 2. Bend your knees so they are pointing toward the ceiling and your feet are flat on the floor. 3. Tighten your lower abdominal muscles to press your lower back against the floor. This motion will tilt your pelvis so your tailbone points up toward the ceiling instead of pointing to your feet or the floor. 4. With gentle tension and even breathing, hold this position for 5-10 seconds. Cat-cow Repeat these steps until your lower back becomes more flexible: 1. Get into a hands-and-knees position on a firm surface. Keep your hands under your shoulders, and keep your knees under your hips. You may place padding under your knees for comfort. 2. Let your head hang down toward your chest. Contract   your abdominal muscles and point your tailbone toward the floor so your lower back becomes rounded like the back of a cat. 3. Hold this position for 5 seconds. 4. Slowly lift your head, let your abdominal muscles relax and point your tailbone up toward the ceiling so your back forms a sagging arch like the back of a cow. 5. Hold this position for 5 seconds.  Press-ups Repeat these steps 5-10 times: 1. Lie on your abdomen (face-down) on the floor. 2. Place your palms near your head, about shoulder-width apart. 3. Keeping your back as relaxed as possible and keeping your hips on the floor, slowly straighten your arms to raise the top half of your body and lift your shoulders. Do not use your back muscles to raise your upper torso. You may adjust the placement of your hands to make yourself more comfortable. 4. Hold this position for 5 seconds while you keep your back relaxed. 5. Slowly return to lying flat on the floor.  Bridges Repeat these steps 10 times: 1. Lie on your back on a firm surface. 2. Bend your knees so they are  pointing toward the ceiling and your feet are flat on the floor. Your arms should be flat at your sides, next to your body. 3. Tighten your buttocks muscles and lift your buttocks off the floor until your waist is at almost the same height as your knees. You should feel the muscles working in your buttocks and the back of your thighs. If you do not feel these muscles, slide your feet 1-2 inches farther away from your buttocks. 4. Hold this position for 3-5 seconds. 5. Slowly lower your hips to the starting position, and allow your buttocks muscles to relax completely. If this exercise is too easy, try doing it with your arms crossed over your chest. Abdominal crunches Repeat these steps 5-10 times: 1. Lie on your back on a firm bed or the floor with your legs extended. 2. Bend your knees so they are pointing toward the ceiling and your feet are flat on the floor. 3. Cross your arms over your chest. 4. Tip your chin slightly toward your chest without bending your neck. 5. Tighten your abdominal muscles and slowly raise your trunk (torso) high enough to lift your shoulder blades a tiny bit off the floor. Avoid raising your torso higher than that because it can put too much stress on your low back and does not help to strengthen your abdominal muscles. 6. Slowly return to your starting position. Back lifts Repeat these steps 5-10 times: 1. Lie on your abdomen (face-down) with your arms at your sides, and rest your forehead on the floor. 2. Tighten the muscles in your legs and your buttocks. 3. Slowly lift your chest off the floor while you keep your hips pressed to the floor. Keep the back of your head in line with the curve in your back. Your eyes should be looking at the floor. 4. Hold this position for 3-5 seconds. 5. Slowly return to your starting position. Contact a health care provider if:  Your back pain or discomfort gets much worse when you do an exercise.  Your worsening back pain or  discomfort does not lessen within 2 hours after you exercise. If you have any of these problems, stop doing these exercises right away. Do not do them again unless your health care provider says that you can. Get help right away if:  You develop sudden, severe back pain. If this   happens, stop doing the exercises right away. Do not do them again unless your health care provider says that you can. This information is not intended to replace advice given to you by your health care provider. Make sure you discuss any questions you have with your health care provider. Document Revised: 06/18/2018 Document Reviewed: 11/13/2017 Elsevier Patient Education  2020 [employer]  

## 2019-11-25 ENCOUNTER — Encounter: Payer: Self-pay | Admitting: Physical Medicine and Rehabilitation

## 2019-12-07 ENCOUNTER — Ambulatory Visit
Admission: RE | Admit: 2019-12-07 | Discharge: 2019-12-07 | Disposition: A | Discharge status: Home / Self Care | Payer: 59 | Source: Ambulatory Visit | Attending: Obstetrics and Gynecology | Admitting: Obstetrics and Gynecology

## 2019-12-07 ENCOUNTER — Other Ambulatory Visit: Payer: Self-pay

## 2019-12-07 DIAGNOSIS — Z803 Family history of malignant neoplasm of breast: Secondary | ICD-10-CM

## 2019-12-07 MED ORDER — GADOBUTROL 1 MMOL/ML IV SOLN
9.0000 mL | Freq: Once | INTRAVENOUS | Status: AC | PRN
  Administered 2019-12-07: 9 mL via INTRAVENOUS

## 2019-12-27 ENCOUNTER — Encounter: Payer: Self-pay | Admitting: Physical Therapy

## 2019-12-27 ENCOUNTER — Other Ambulatory Visit: Payer: Self-pay

## 2019-12-27 ENCOUNTER — Ambulatory Visit: Payer: 59 | Attending: Rheumatology | Admitting: Physical Therapy

## 2019-12-27 DIAGNOSIS — R2689 Other abnormalities of gait and mobility: Secondary | ICD-10-CM | POA: Diagnosis present

## 2019-12-27 DIAGNOSIS — M25552 Pain in left hip: Secondary | ICD-10-CM | POA: Insufficient documentation

## 2019-12-27 DIAGNOSIS — M25551 Pain in right hip: Secondary | ICD-10-CM | POA: Insufficient documentation

## 2019-12-27 DIAGNOSIS — G8929 Other chronic pain: Secondary | ICD-10-CM

## 2019-12-27 DIAGNOSIS — M545 Low back pain, unspecified: Secondary | ICD-10-CM | POA: Insufficient documentation

## 2019-12-27 NOTE — Therapy (Signed)
[place], [place], [address] Phone: [phone]   Fax:  [phone]  Physical Therapy Evaluation  Patient Details  Name: [name] MRN: [id number] Date of Birth: 06-Sep-1960 Referring Provider (PT): Dr Allena Napoleon   Encounter Date: 12/27/2019   PT End of Session - 12/27/19 1346    Visit Number 1    Number of Visits 12    Date for PT Re-Evaluation 02/07/20    Authorization Type [employer]    PT Start Time [phone]   Patient 6 minutes late   PT Stop Time 1018    PT Time Calculation (min) 42 min    Activity Tolerance Patient tolerated treatment well    Behavior During Therapy [facility] for tasks assessed/performed           Past Medical History:  Diagnosis Date  . Abnormal heart rate   . Abnormal uterine bleeding (AUB)   . Allergic rhinitis   . Anxiety   . Asthma   . B12 deficiency   . Bruises easily   . Cat-scratch disease    h/o  . Chronic back pain    oa  . CMV (cytomegalovirus) ([place])    h/o  . Depression   . Fibrocystic breast disease   . Fibroids   . Fibromyalgia   . GERD (gastroesophageal reflux disease)    watches diet  . H/O hiatal hernia   . H/O myomectomy   . Hashimoto thyroiditis   . History of babesiosis    PARASITITIS RESOLVED  . History of Bell's palsy    2000--  resolved  . History of concussion    age 62--  no residual  . History of hyperparathyroidism    PRIMARY--  S/P PARATHYROIDECTOMY 01-28-2011  . History of palpitations    stress induced  . History of uterine fibroid   . Hypoglycemia   . Hypothyroidism   . Lyme disease    CHRONIC --  FOR 22 YRS (SINCE APPROX 1996)  AND HX CAT SCRATCH DISEASE WHICH HAS RESOLVED  . Musculoskeletal chest pain    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  . Osteoarthritis    all joints secondary to lyme disease/  back is worse  . Vitamin D deficiency   . Wears contact lenses     Past Surgical History:  Procedure Laterality Date    . COLONOSCOPY  12/2017  . DILATATION & CURRETTAGE/HYSTEROSCOPY WITH RESECTOCOPE N/A 12/02/2012   Procedure: DILATATION & CURETTAGE/HYSTEROSCOPY WITH RESECTOCOPE;  Surgeon: Darlyn Chamber, MD;  Location: [place];  Service: Gynecology;  Laterality: N/A;  . GANGLION CYST EXCISION  2007   HAND  . HYSTEROSCOPIC MYOMECTOMY AND ABDOMINAL MYOMECTOMY  2006  . PARATHYROIDECTOMY  01/28/2011   Procedure: PARATHYROIDECTOMY;  Surgeon: Earnstine Regal, MD;  Location: [facility];  Service: General;  Laterality: N/A;   . STRESS ECHO REPORT  09-01-2009  DR [place]ique   NORMAL LVF/  EF 60%/  NORMAL HYPERDYNAMIC RESPONSE  . TILT TABLE STUDY  08-21-2011    DR KLEIN   BP WERE CONSISTENT 110-117 THROUGH OUT STUDY/ BASELINE HR 68-70  . TILT TABLE STUDY N/A 08/21/2011   Procedure: TILT TABLE STUDY;  Surgeon: Deboraha Sprang, MD;  Location: [facility] CATH LAB;  Service: Cardiovascular;  Laterality: N/A;  . TONSILLECTOMY  age 24    There were no vitals filed for this visit.    Subjective Assessment - 12/27/19 0944    Subjective Patient has a long hisotry  of bilateral hip and lower back pain. She also has mid thoracic and cervical pain. The pain has been increasing over the past few years. She was a runnin in the past. She has only been able to walk recentyl. She has the most pain when she sits for long periods of time. She sits at her job. She is currently on disability.    Pertinent History fibromyalgia, OA, lymes disease, irregular heart beat, anxiety, cat scratch disease,    How long can you sit comfortably? cant sit in one position for too long ebfore she has to shift    How long can you stand comfortably? can stand without much of an issue    How long can you walk comfortably? walking helps    Diagnostic tests lumbar x-ray: mild sputring and degeneration; thoracic spine pain mild degeneration. All imaging done in 2019    Patient Stated Goals to have less pain; to be more active    Currently in Pain? Yes     Pain Score 6     Pain Location Back    Pain Orientation Right;Left    Pain Descriptors / Indicators Aching    Pain Type Chronic pain    Pain Radiating Towards can get abnormal sensation in both legs    Pain Onset More than a month ago    Pain Frequency Constant    Aggravating Factors  Standing and walking    Multiple Pain Sites Yes    Pain Score --   patient states the pain in both hisp is the same   Pain Location Hip    Pain Orientation Right;Left    Pain Descriptors / Indicators Aching    Pain Type Chronic pain    Pain Radiating Towards into bilateral gluteals    Pain Onset More than a month ago    Pain Frequency Constant    Aggravating Factors  Standing and walking    Pain Relieving Factors rest    Effect of Pain on Daily Activities difficulty perfroming ADL's              [facility] PT Assessment - 12/27/19 0001      Assessment   Medical Diagnosis Bilateral Hip Pain/ Low Back Pain     Referring Provider (PT) Dr Allena Napoleon    Onset Date/Surgical Date --   1990    Next MD Visit Next year with Dr Shawnie Pons     Prior Therapy was seeing chriopracter. Has had PT before.       Precautions   Precautions None      Restrictions   Weight Bearing Restrictions No      Balance Screen   Has the patient fallen in the past 6 months No    Has the patient had a decrease in activity level because of a fear of falling?  No    Is the patient reluctant to leave their home because of a fear of falling?  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       Cognition   Overall Cognitive Status Within Functional Limits for tasks assessed    Attention Focused    Focused Attention Appears intact    Memory Appears intact    Awareness Appears intact  Problem Solving Appears intact      Observation/Other  Assessments   Focus on Therapeutic Outcomes (FOTO)  lumbar 53% limited 48% expected / Hip 32 % limited 39% predicted       Sensation   Light Touch Appears Intact    Additional Comments sensation of wetness into the left leg       Coordination   Gross Motor Movements are Fluid and Coordinated Yes    Fine Motor Movements are Fluid and Coordinated Yes      ROM / Strength   AROM / PROM / Strength AROM;PROM;Strength      AROM   AROM Assessment Site Hip;Knee      PROM   Overall PROM Comments bilateral hip flexion painful and tight passed 80 degrees; pain with bilateral IR       Strength   Strength Assessment Site Hip;Knee    Right/Left Hip Right;Left    Right Hip Flexion 4/5    Right Hip ABduction 4/5    Right Hip ADduction 4+/5    Left Hip Flexion 4/5    Left Hip ABduction 4/5    Left Hip ADduction 4/5    Right/Left Knee Right;Left    Right Knee Flexion 5/5    Right Knee Extension 5/5    Left Knee Flexion 5/5    Left Knee Extension 5/5      Palpation   Palpation comment significant tenderness to palpation around torchanter are gluteal and into lumbar spine       Special Tests   Other special tests FABER (+) bilateral       Ambulation/Gait   Gait Comments decreased bilateral hip flexion decreased bilateral; right anklepronation with gait; decreased right sngle leg stance time; lateral movement with gait                      Objective measurements completed on examination: See above findings.       [facility] PT Treatment/Exercise - 12/27/19 0001      Exercises   Exercises Lumbar      Lumbar Exercises: Stretches   Lower Trunk Rotation Limitations LTR with mod cuing for amplitude     Piriformis Stretch Limitations for gluteal 2x20 sec hold    Other Lumbar Stretch Exercise Patient given tennis ball for selftrigger point release                   PT Education - 12/27/19 1022    Education Details reviewed HEp and symptom mangement; benefits  and risk of    Person(s) Educated Patient    Methods Explanation;Demonstration;Tactile cues;Verbal cues    Comprehension Verbalized understanding;Returned demonstration;Verbal cues required;Tactile cues required            PT Short Term Goals - 12/27/19 1408      PT SHORT TERM GOAL #1   Title Patient will increased bilateral passive hip flexion to 100 dgrees without pain    Time 3    Period Weeks    Status New    Target Date 01/17/20      PT SHORT TERM GOAL #2   Title Patient will increase bilateral hip flexion strength to 5/5    Time 3    Period Weeks    Status New    Target Date 01/17/20      PT SHORT TERM GOAL #3   Title Patient will demonstrate full left lumbar rotation without pain    Time 3    Period  Weeks    Status New    Target Date 01/17/20             PT Long Term Goals - 12/27/19 1410      PT LONG TERM GOAL #1   Title Patient will return to walking or light jogging without increased pain    Time 6    Period Weeks    Status New    Target Date 02/07/20      PT LONG TERM GOAL #2   Title Patient will be indepdnent with program for continued core strengthening and stretching    Time 6    Period Weeks    Status New    Target Date 02/07/20      PT LONG TERM GOAL #3   Title Patient will report 2/10 pain in back and hips while perfroming ADL's    Time 6    Period Weeks    Status New    Target Date 02/07/20                  Plan - 12/27/19 1402    Clinical Impression Statement Patient is a 59 year old fmale with long standing back and bilateral hip pain. She has the most pain when she sits for long periods of time. She has significant stiffness in hip flexion and internal rotation. She has spasming in both hipas and her back. She has hip flexion weakness. She used to be a runner and would like to be more active. She would benefit from skilled therapy to improve hip motion, improve strength, and functional endurance.    Personal Factors and  Comorbidities Comorbidity 1;Comorbidity 2;Comorbidity 3+;Fitness;Time since onset of injury/illness/exacerbation    Comorbidities Lymes disease, OA, anxiety,    Examination-Activity Limitations Bed Mobility;Sit;Lift    Examination-Participation Restrictions Cleaning;Community Activity    Stability/Clinical Decision Making Evolving/Moderate complexity    Clinical Decision Making Moderate    Rehab Potential Good    PT Frequency 2x / week    PT Duration 6 weeks    PT Treatment/Interventions ADLs/Self Care Home Management;Electrical Stimulation;Iontophoresis 4mg /ml Dexamethasone;Traction;Ultrasound;Moist Heat;DME Instruction;Stair training;Gait training;Functional mobility training;Therapeutic activities;Therapeutic exercise;Neuromuscular re-education;Patient/family education;Manual techniques;Passive range of motion;Taping;Wheelchair mobility training;Dry needling    PT Next Visit Plan soft tissue mobilization and decrmpression to both hips, begin strengthening; consider supien march; posterior pelvic til; review breathing; add single knee to chest stretch if able; progress standing exercises as tolerated    PT Home Exercise Plan tennis ball trigger point release; glut stretch; LTR    Consulted and Agree with Plan of Care Patient           Patient will benefit from skilled therapeutic intervention in order to improve the following deficits and impairments:  Abnormal gait, Difficulty walking, Pain, Decreased activity tolerance, Decreased mobility, Decreased strength, Impaired flexibility, Decreased safety awareness, Increased muscle spasms  Visit Diagnosis: Chronic bilateral low back pain without sciatica  Pain in left hip  Pain in right hip  Other abnormalities of gait and mobility     Problem List Patient Active Problem List   Diagnosis Date Noted  . Acute post-traumatic headache, not intractable 06/02/2018  . Fibromyalgia 06/05/2016  . Other insomnia 06/05/2016  . Other fatigue  06/05/2016  . DDD (degenerative disc disease), cervical 06/05/2016  . DDD (degenerative disc disease), lumbar 06/05/2016  . Osteopenia of multiple sites 06/05/2016  . History of asthma 06/05/2016  . History of Hashimoto thyroiditis 06/05/2016  . History of anxiety 06/05/2016  . Seasonal affective  disorder ([place]) 06/05/2016  . Postmenopausal bleeding 12/02/2012    Class: Present on Admission  . Fibroids, submucosal 12/02/2012    Class: Present on Admission  . Hx of Lyme disease 06/02/2012  . Postural dizziness 08/08/2011  . Hyperparathyroidism, primary ([place]) 01/08/2011    Carney Living PT DPT  12/27/2019, 4:31 PM  [facility] [address] [place], [place], [address] Phone: [phone]   Fax:  [phone]  Name: [name] MRN: [id number] Date of Birth: Jul 22, 1960

## 2019-12-27 NOTE — Patient Instructions (Signed)
Access Code: [email or web address] URL: [email or web address] Date: 12/27/2019 Prepared by: Carolyne Littles  Exercises Standing Glute Med Mobilization with Wyvonnia Lora on Wall - 1 x daily - 7 x weekly - 3 sets - 10 reps Supine Lower Trunk Rotation - 1 x daily - 7 x weekly - 3 sets - 10 reps Supine Piriformis Stretch with Foot on Ground - 1 x daily - 7 x weekly - 3 sets - 3 reps - 20 hold

## 2020-01-03 ENCOUNTER — Ambulatory Visit (INDEPENDENT_AMBULATORY_CARE_PROVIDER_SITE_OTHER): Payer: 59 | Admitting: Family Medicine

## 2020-01-03 ENCOUNTER — Encounter (INDEPENDENT_AMBULATORY_CARE_PROVIDER_SITE_OTHER): Payer: Self-pay | Admitting: Family Medicine

## 2020-01-03 ENCOUNTER — Other Ambulatory Visit: Payer: Self-pay

## 2020-01-03 VITALS — BP 142/83 | HR 76 | Temp 97.6°F | Ht 64.0 in | Wt 201.0 lb

## 2020-01-03 DIAGNOSIS — Z9189 Other specified personal risk factors, not elsewhere classified: Secondary | ICD-10-CM | POA: Diagnosis not present

## 2020-01-03 DIAGNOSIS — Z6834 Body mass index (BMI) 34.0-34.9, adult: Secondary | ICD-10-CM | POA: Diagnosis not present

## 2020-01-03 DIAGNOSIS — N1831 Chronic kidney disease, stage 3a: Secondary | ICD-10-CM

## 2020-01-03 DIAGNOSIS — E559 Vitamin D deficiency, unspecified: Secondary | ICD-10-CM

## 2020-01-03 DIAGNOSIS — E669 Obesity, unspecified: Secondary | ICD-10-CM

## 2020-01-03 DIAGNOSIS — Z8639 Personal history of other endocrine, nutritional and metabolic disease: Secondary | ICD-10-CM | POA: Diagnosis not present

## 2020-01-03 DIAGNOSIS — Z1331 Encounter for screening for depression: Secondary | ICD-10-CM | POA: Diagnosis not present

## 2020-01-03 DIAGNOSIS — E538 Deficiency of other specified B group vitamins: Secondary | ICD-10-CM

## 2020-01-03 DIAGNOSIS — R0602 Shortness of breath: Secondary | ICD-10-CM

## 2020-01-03 DIAGNOSIS — R5383 Other fatigue: Secondary | ICD-10-CM | POA: Diagnosis not present

## 2020-01-03 DIAGNOSIS — I10 Essential (primary) hypertension: Secondary | ICD-10-CM

## 2020-01-03 DIAGNOSIS — Z0289 Encounter for other administrative examinations: Secondary | ICD-10-CM

## 2020-01-04 LAB — CBC WITH DIFFERENTIAL/PLATELET
Basophils Absolute: 0.1 10*3/uL (ref 0.0–0.2)
Basos: 1 %
EOS (ABSOLUTE): 0.1 10*3/uL (ref 0.0–0.4)
Eos: 2 %
Hematocrit: 43.8 % (ref 34.0–46.6)
Hemoglobin: 15.1 g/dL (ref 11.1–15.9)
Immature Grans (Abs): 0 10*3/uL (ref 0.0–0.1)
Immature Granulocytes: 0 %
Lymphocytes Absolute: 1.2 10*3/uL (ref 0.7–3.1)
Lymphs: 24 %
MCH: 30.8 pg (ref 26.6–33.0)
MCHC: 34.5 g/dL (ref 31.5–35.7)
MCV: 89 fL (ref 79–97)
Monocytes Absolute: 0.4 10*3/uL (ref 0.1–0.9)
Monocytes: 8 %
Neutrophils Absolute: 3.3 10*3/uL (ref 1.4–7.0)
Neutrophils: 65 %
Platelets: 197 10*3/uL (ref 150–450)
RBC: 4.9 x10E6/uL (ref 3.77–5.28)
RDW: 11.8 % (ref 11.7–15.4)
WBC: 5.1 10*3/uL (ref 3.4–10.8)

## 2020-01-04 LAB — FOLATE: Folate: 9.1 ng/mL (ref >3.0)

## 2020-01-04 LAB — TSH: TSH: 3.92 u[IU]/mL (ref 0.450–4.500)

## 2020-01-04 LAB — HEMOGLOBIN A1C
Est. average glucose Bld gHb Est-mCnc: 100 mg/dL
Hgb A1c MFr Bld: 5.1 % (ref 4.8–5.6)

## 2020-01-04 LAB — INSULIN, RANDOM: INSULIN: 11.6 u[IU]/mL (ref 2.6–24.9)

## 2020-01-04 LAB — COMPREHENSIVE METABOLIC PANEL
ALT: 69 IU/L — ABNORMAL HIGH (ref 0–32)
AST: 44 IU/L — ABNORMAL HIGH (ref 0–40)
Albumin/Globulin Ratio: 1.9 (ref 1.2–2.2)
Albumin: 4.7 g/dL (ref 3.8–4.9)
Alkaline Phosphatase: 79 IU/L (ref 44–121)
BUN/Creatinine Ratio: 11 (ref 9–23)
BUN: 10 mg/dL (ref 6–24)
Bilirubin Total: 0.4 mg/dL (ref 0.0–1.2)
CO2: 25 mmol/L (ref 20–29)
Calcium: 9.4 mg/dL (ref 8.7–10.2)
Chloride: 102 mmol/L (ref 96–106)
Creatinine, Ser: 0.91 mg/dL (ref 0.57–1.00)
GFR calc Af Amer: 81 mL/min/{1.73_m2} (ref >59)
GFR calc non Af Amer: 70 mL/min/{1.73_m2} (ref >59)
Globulin, Total: 2.5 g/dL (ref 1.5–4.5)
Glucose: 80 mg/dL (ref 65–99)
Potassium: 4.1 mmol/L (ref 3.5–5.2)
Sodium: 140 mmol/L (ref 134–144)
Total Protein: 7.2 g/dL (ref 6.0–8.5)

## 2020-01-04 LAB — T3: T3, Total: 139 ng/dL (ref 71–180)

## 2020-01-04 LAB — LIPID PANEL WITH LDL/HDL RATIO
Cholesterol, Total: 198 mg/dL (ref 100–199)
HDL: 52 mg/dL (ref >39)
LDL Chol Calc (NIH): 126 mg/dL — ABNORMAL HIGH (ref 0–99)
LDL/HDL Ratio: 2.4 ratio (ref 0.0–3.2)
Triglycerides: 114 mg/dL (ref 0–149)
VLDL Cholesterol Cal: 20 mg/dL (ref 5–40)

## 2020-01-04 LAB — VITAMIN B12: Vitamin B-12: 943 pg/mL (ref 232–1245)

## 2020-01-04 LAB — T4: T4, Total: 7 ug/dL (ref 4.5–12.0)

## 2020-01-04 LAB — VITAMIN D 25 HYDROXY (VIT D DEFICIENCY, FRACTURES): Vit D, 25-Hydroxy: 35 ng/mL (ref 30.0–100.0)

## 2020-01-05 ENCOUNTER — Telehealth: Payer: Self-pay | Admitting: Rheumatology

## 2020-01-05 MED ORDER — TRAMADOL HCL 50 MG PO TABS
50.0000 mg | ORAL_TABLET | Freq: Two times a day (BID) | ORAL | 0 refills | Status: DC | PRN
Start: 2020-01-05 — End: 2020-01-12

## 2020-01-05 NOTE — Telephone Encounter (Signed)
Per office note on 11/23/2019: Other chronic pain - tramadol. UDS & narcotic agreement: 04/12/2019 (tramadol refilled for the last time on 11/05/2019, she is to transfer rx to PCP or orthopedist).  We will refer her to pain clinic.  Last Visit: 11/23/2019 Next Visit: 05/23/2020 UDS:04/12/2019 Narc Agreement: 06/10/2019  Last Fill: 11/05/2019  Patient is requesting 12 tablets as she has an appointment with pain clinic on 01/12/2020.   Okay to refill Tramadol?

## 2020-01-05 NOTE — Telephone Encounter (Signed)
Patient has an appointment scheduled with a pain specialist on 01/12/2020. Patient will run out of Tramadol before then, and requests an rx for 12 tabs if possible. Patient uses [facility] on [address]. Please call to advise.

## 2020-01-10 DIAGNOSIS — Z01419 Encounter for gynecological examination (general) (routine) without abnormal findings: Secondary | ICD-10-CM | POA: Diagnosis not present

## 2020-01-10 DIAGNOSIS — Z6834 Body mass index (BMI) 34.0-34.9, adult: Secondary | ICD-10-CM | POA: Diagnosis not present

## 2020-01-10 NOTE — Progress Notes (Signed)
Dear Bo Merino, MD,   Thank you for referring [name] to our clinic. The following note includes my evaluation and treatment recommendations.  Chief Complaint:   OBESITY [name] (MR# [id number]) is a 59 y.o. female who presents for evaluation and treatment of obesity and related comorbidities. Current BMI is Body mass index is 34.5 kg/m. [name] has been struggling with her weight for many years and has been unsuccessful in either losing weight, maintaining weight loss, or reaching her healthy weight goal.  [name] is currently in the action stage of change and ready to dedicate time achieving and maintaining a healthier weight. [name] is interested in becoming our patient and working on intensive lifestyle modifications including (but not limited to) diet and exercise for weight loss.  [name]'s habits were reviewed today and are as follows: Her family eats meals together, she struggles with family and or coworkers weight loss sabotage, her desired weight loss is 51 lbs, she started gaining weight fostering 2 ADHD babies in January 2017, her heaviest weight ever was 204 pounds, she has significant food cravings issues, she snacks frequently in the evenings, she skips meals frequently, she is frequently drinking liquids with calories, she has problems with excessive hunger, she frequently eats larger portions than normal and she struggles with emotional eating.  Depression Screen [name]'s Food and Mood (modified PHQ-9) score was 13.  Depression screen PHQ 2/9 01/03/2020  Decreased Interest 2  Down, Depressed, Hopeless 1  PHQ - 2 Score 3  Altered sleeping 1  Tired, decreased energy 2  Change in appetite 2  Feeling bad or failure about yourself  1  Trouble concentrating 1  Moving slowly or fidgety/restless 2  Suicidal thoughts 1  PHQ-9 Score 13  Difficult doing work/chores Somewhat difficult   Subjective:   1. Other fatigue Shawnee admits to daytime somnolence  and admits to waking up still tired. Patent has a history of symptoms of daytime fatigue. [name] generally gets 8 hours of sleep per night, and states that she has difficulty falling asleep. Snoring is not present. Apneic episodes are not present. Epworth Sleepiness Score is 4. EKG-normal sinus rhythm at 69 BPM.  2. SOB (shortness of breath) on exertion Christalyn notes increasing shortness of breath with exercising and seems to be worsening over time with weight gain. She notes getting out of breath sooner with activity than she used to. This has not gotten worse recently. [name] denies shortness of breath at rest or orthopnea.  3. History of Hashimoto thyroiditis [name] is on [employer] Thyroid and she is managed by a doctor in [place].   4. Chronic kidney disease, stage 3a (HCC) [name] sees Dr. Irena Reichmann in [place]. She reports she is to get a follow up abdominal ultrasound.  5. Vitamin D deficiency [name] is not on Vit D, and she notes fatigue.  6. Essential hypertension [name]'s blood pressure is slightly elevated today. She denies chest pain, chest pressure, or headache. She is not on medications.  7. B12 deficiency [name] is not on supplementation, and she notes fatigue.  8. At risk for deficient intake of food The patient is at a higher than average risk of deficient intake of food due to skipping meals often.  Assessment/Plan:   1. Other fatigue [name] does feel that her weight is causing her energy to be lower than it should be. Fatigue may be related to obesity, depression or many other causes. Labs will be ordered, and in the meanwhile,  [name] will focus on self care including making healthy food choices, increasing physical activity and focusing on stress reduction.  - EKG 12-Lead - Folate - Hemoglobin A1c - Insulin, random  2. SOB (shortness of breath) on exertion [name] does feel that she gets out of breath more easily that she used to when she exercises. [name]'s shortness of breath  appears to be obesity related and exercise induced. She has agreed to work on weight loss and gradually increase exercise to treat her exercise induced shortness of breath. Will continue to monitor closely.  - CBC with Differential/Platelet - Lipid Panel With LDL/HDL Ratio  3. History of Hashimoto thyroiditis We will check thyroid panel today. [name] will follow up as directed.  - T3 - T4 - TSH  4. Chronic kidney disease, stage 3a ([place]) We will check labs today. [name] will follow up as directed.  - Comprehensive metabolic panel  5. Vitamin D deficiency Low Vitamin D level contributes to fatigue and are associated with obesity, breast, and colon cancer. We will check labs today. [name] will follow-up for routine testing of Vitamin D, at least 2-3 times per year to avoid over-replacement.  - VITAMIN D 25 Hydroxy (Vit-D Deficiency, Fractures)  6. Essential hypertension [name] is working on healthy weight loss and exercise to improve blood pressure control. We will watch for signs of hypotension as she continues her lifestyle modifications. EKG was done today.  7. B12 deficiency The diagnosis was reviewed with the patient. Counseling provided today, see below. We will continue to monitor. We will check labs today. Orders and follow up as documented in patient record.  Counseling . The body needs vitamin B12: to make red blood cells; to make DNA; and to help the nerves work properly so they can carry messages from the brain to the body.  . The main causes of vitamin B12 deficiency include dietary deficiency, digestive diseases, pernicious anemia, and having a surgery in which part of the stomach or small intestine is removed.  . Certain medicines can make it harder for the body to absorb vitamin B12. These medicines include: heartburn medications; some antibiotics; some medications used to treat diabetes, gout, and high cholesterol.  . In some cases, there are no symptoms of this condition.  If the condition leads to anemia or nerve damage, various symptoms can occur, such as weakness or fatigue, shortness of breath, and numbness or tingling in your hands and feet.   . Treatment:  o May include taking vitamin B12 supplements.  o Avoid alcohol.  o Eat lots of healthy foods that contain vitamin B12: - Beef, pork, chicken, [place], and organ meats, such as liver.  - Seafood: This includes clams, rainbow trout, salmon, tuna, and haddock. Eggs.  - Cereal and dairy products that are fortified: This means that vitamin B12 has been added to the food.   - Vitamin B12  8. Depression screening Wava had a positive depression screening. Depression is commonly associated with obesity and often results in emotional eating behaviors. We will monitor this closely and work on CBT to help improve the non-hunger eating patterns. Referral to Psychology may be required if no improvement is seen as she continues in our clinic.  9. At risk for deficient intake of food Jaleya was given approximately 15 minutes of deficit intake of food prevention counseling today. Elexa is at risk for eating too few calories based on current food recall. She was encouraged to focus on meeting caloric and protein goals according to  her recommended meal plan.   10. Class 1 obesity with serious comorbidity and body mass index (BMI) of 34.0 to 34.9 in adult, unspecified obesity type Avanelle is currently in the action stage of change and her goal is to continue with weight loss efforts. I recommend Kamayah begin the structured treatment plan as follows:  She has agreed to the Category 3 Plan.  Exercise goals: No exercise has been prescribed at this time.   Behavioral modification strategies: increasing lean protein intake, meal planning and cooking strategies and keeping healthy foods in the home.  She was informed of the importance of frequent follow-up visits to maximize her success with intensive lifestyle modifications for  her multiple health conditions. She was informed we would discuss her lab results at her next visit unless there is a critical issue that needs to be addressed sooner. Zela agreed to keep her next visit at the agreed upon time to discuss these results.  Objective:   Blood pressure (!) 142/83, pulse 76, temperature 97.6 F (36.4 C), temperature source Oral, height 5\' 4"  (1.626 m), weight 201 lb (91.2 kg), last menstrual period 01/22/2011, SpO2 99 %. Body mass index is 34.5 kg/m.  EKG: Normal sinus rhythm, rate 69 BPM.  Indirect Calorimeter completed today shows a VO2 of 329 and a REE of 2294.  Her calculated basal metabolic rate is 1308 thus her basal metabolic rate is better than expected.  General: Cooperative, alert, well developed, in no acute distress. HEENT: Conjunctivae and lids unremarkable. Cardiovascular: Regular rhythm.  Lungs: Normal work of breathing. Neurologic: No focal deficits.   Lab Results  Component Value Date   CREATININE 0.91 01/03/2020   BUN 10 01/03/2020   NA 140 01/03/2020   K 4.1 01/03/2020   CL 102 01/03/2020   CO2 25 01/03/2020   Lab Results  Component Value Date   ALT 69 (H) 01/03/2020   AST 44 (H) 01/03/2020   ALKPHOS 79 01/03/2020   BILITOT 0.4 01/03/2020   Lab Results  Component Value Date   HGBA1C 5.1 01/03/2020   Lab Results  Component Value Date   INSULIN 11.6 01/03/2020   Lab Results  Component Value Date   TSH 3.920 01/03/2020   Lab Results  Component Value Date   CHOL 198 01/03/2020   HDL 52 01/03/2020   LDLCALC 126 (H) 01/03/2020   TRIG 114 01/03/2020   Lab Results  Component Value Date   WBC 5.1 01/03/2020   HGB 15.1 01/03/2020   HCT 43.8 01/03/2020   MCV 89 01/03/2020   PLT 197 01/03/2020   No results found for: IRON, TIBC, FERRITIN  Attestation Statements:   Reviewed by clinician on day of visit: allergies, medications, problem list, medical history, surgical history, family history, social history, and  previous encounter notes.   I, Trixie Dredge, am acting as transcriptionist for Coralie Common, MD.  This is the patient's first visit at [facility]. The patient's NEW PATIENT PACKET was reviewed at length. Included in the packet: current and past health history, medications, allergies, ROS, gynecologic history (women only), surgical history, family history, social history, weight history, weight loss surgery history (for those that have had weight loss surgery), nutritional evaluation, mood and food questionnaire, PHQ9, Epworth questionnaire, sleep habits questionnaire, patient life and health improvement goals questionnaire. These will all be scanned into the patient's chart under media.   During the visit, I independently reviewed the patient's EKG, bioimpedance scale results, and indirect calorimeter results. I used this  information to tailor a meal plan for the patient that will help her to lose weight and will improve her obesity-related conditions going forward. I performed a medically necessary appropriate examination and/or evaluation. I discussed the assessment and treatment plan with the patient. The patient was provided an opportunity to ask questions and all were answered. The patient agreed with the plan and demonstrated an understanding of the instructions. Labs were ordered at this visit and will be reviewed at the next visit unless more critical results need to be addressed immediately. Clinical information was updated and documented in the EMR.   Time spent on visit including pre-visit chart review and post-visit care was 50 minutes.   A separate 15 minutes was spent on risk counseling (see above).  I have reviewed the above documentation for accuracy and completeness, and I agree with the above. - Jinny Blossom, MD

## 2020-01-12 ENCOUNTER — Encounter: Payer: Self-pay | Admitting: Physical Medicine and Rehabilitation

## 2020-01-12 ENCOUNTER — Other Ambulatory Visit: Payer: Self-pay

## 2020-01-12 ENCOUNTER — Encounter: Payer: 59 | Attending: Physical Medicine and Rehabilitation | Admitting: Physical Medicine and Rehabilitation

## 2020-01-12 VITALS — BP 125/82 | HR 86 | Temp 98.3°F | Ht 64.0 in | Wt 204.2 lb

## 2020-01-12 DIAGNOSIS — Z7689 Persons encountering health services in other specified circumstances: Secondary | ICD-10-CM | POA: Diagnosis not present

## 2020-01-12 DIAGNOSIS — M7918 Myalgia, other site: Secondary | ICD-10-CM | POA: Diagnosis not present

## 2020-01-12 DIAGNOSIS — Z8619 Personal history of other infectious and parasitic diseases: Secondary | ICD-10-CM

## 2020-01-12 DIAGNOSIS — Z8639 Personal history of other endocrine, nutritional and metabolic disease: Secondary | ICD-10-CM | POA: Diagnosis not present

## 2020-01-12 DIAGNOSIS — M797 Fibromyalgia: Secondary | ICD-10-CM

## 2020-01-12 DIAGNOSIS — G44319 Acute post-traumatic headache, not intractable: Secondary | ICD-10-CM | POA: Diagnosis not present

## 2020-01-12 DIAGNOSIS — Z5181 Encounter for therapeutic drug level monitoring: Secondary | ICD-10-CM | POA: Diagnosis not present

## 2020-01-12 DIAGNOSIS — G894 Chronic pain syndrome: Secondary | ICD-10-CM

## 2020-01-12 DIAGNOSIS — M5030 Other cervical disc degeneration, unspecified cervical region: Secondary | ICD-10-CM

## 2020-01-12 MED ORDER — TRAMADOL HCL 50 MG PO TABS
50.0000 mg | ORAL_TABLET | Freq: Four times a day (QID) | ORAL | 0 refills | Status: DC | PRN
Start: 2020-01-12 — End: 2020-03-08

## 2020-01-12 NOTE — Progress Notes (Signed)
Subjective:    Patient ID: [name], female    DOB: 25-Mar-1960, 59 y.o.   MRN: [id number]  HPI  Pt is a 59 yr old female with hx of asthma, DDD, DJD,  Cat scratch disease, Hashimoto's thyroiditis,Lyme's disease, chronic low back pain , fibromyalgia, and generalized chronic pain here for evaluation of pain.    Has rx for tramadol- but Rheumatology wants to push to pain doctor.   50 mg-  Got 2 tabs/day-  From Rheum- usually 2 tabs/day- sometimes might need more than that.    Sent you back to therapy- on Monday's in December-   Gets bad muscle spasms from spinal stenosis- uses Mag- helps, but can get diarrhea.   Hasn't tried theracane- has gotten tennis ball for trigger points.    "no one would do surgery on sister's back"- [facility] did surgery for spinal stenosis.    Feels a "wetness" - not draining- - in lumbar paraspinals and sacrum/buttocks-   Also really hurting in rhomboids B/L this AM.  Had difficulty with posture in for 9 months-  2003-2004- Lyme's disease.   Going to get SOT treatment for chronic Lyme's and cat scratch disease-  Early January 2022 to be done.  Had toxoplasmosis, CMV- has a"high viral load"-  Thought would die with Lyme's 2x in the 9 months- "got into heart" caused palpitations.    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  Moved form [place] in 2006 to [place]. Plans to retired to [place]. So little time since adopted 57 yr old from foster care.    Going to [employer] next week with new son.  Pain Inventory Average Pain 9 Pain Right Now 9 My pain is constant, sharp, burning, stabbing and aching  In the last 24 hours, has pain interfered with the following? General activity 9 Relation with others 9 Enjoyment of life 10 What TIME of day is your pain at its worst? morning , daytime and evening Sleep (in general) Good  Pain is worse with: bending, sitting and crouching Pain improves with:  therapy/exercise, pacing activities, medication, injections and heat Relief from Meds: 7  walk without assistance how many minutes can you walk? 2 hours ability to climb steps?  yes do you drive?  yes Do you have any goals in this area?  yes  employed # of hrs/week [employer] I need assistance with the following:  dressing, household duties and shopping Do you have any goals in this area?  yes  weakness tremor tingling spasms depression  New patient  New Patient    Family History  Problem Relation Age of Onset  . Mitral valve prolapse Father   . Hypertension Father   . Dementia Father   . Hypothyroidism Mother   . Colon polyps Brother   . Aneurysm Brother   . Arthritis Brother   . Lupus Sister   . Rheum arthritis Sister   . Osteoarthritis Sister   . Cancer Maternal Aunt        breast  . Breast cancer Cousin    Social History   Socioeconomic History  . Marital status: Married    Spouse name: Not on file  . Number of children: Not on file  . Years of education: 2 masters  . Highest education level: Master's degree (e.g., MA, MS, MEng, MEd, MSW, MBA)  Occupational History  . Not on file  Tobacco Use  . Smoking status: Never  Smoker  . Smokeless tobacco: Never Used  Vaping Use  . Vaping Use: Never used  Substance and Sexual Activity  . Alcohol use: Not Currently    Comment: occasional wine   . Drug use: No  . Sexual activity: Not on file  Other Topics Concern  . Not on file  Social History Narrative   Lives at home with her husband and her adopted baby    Right handed   Caffeine: 2 cups daily    Social Determinants of Health   Financial Resource Strain:   . Difficulty of Paying Living Expenses: Not on file  Food Insecurity:   . Worried About Charity fundraiser in the Last Year: Not on file  . Ran Out of Food in the Last Year: Not on file  Transportation Needs:   . Lack of Transportation (Medical): Not on file  . Lack of Transportation  (Non-Medical): Not on file  Physical Activity:   . Days of Exercise per Week: Not on file  . Minutes of Exercise per Session: Not on file  Stress:   . Feeling of Stress : Not on file  Social Connections:   . Frequency of Communication with Friends and Family: Not on file  . Frequency of Social Gatherings with Friends and Family: Not on file  . Attends Religious Services: Not on file  . Active Member of Clubs or Organizations: Not on file  . Attends Archivist Meetings: Not on file  . Marital Status: Not on file   Past Surgical History:  Procedure Laterality Date  . COLONOSCOPY  12/2017  . DILATATION & CURRETTAGE/HYSTEROSCOPY WITH RESECTOCOPE N/A 12/02/2012   Procedure: DILATATION & CURETTAGE/HYSTEROSCOPY WITH RESECTOCOPE;  Surgeon: Darlyn Chamber, MD;  Location: [facility];  Service: Gynecology;  Laterality: N/A;  . GANGLION CYST EXCISION  2007   HAND  . HERNIA REPAIR  2010   Exploratory  . HYSTEROSCOPIC MYOMECTOMY AND ABDOMINAL MYOMECTOMY  2006  . PARATHYROIDECTOMY  01/28/2011   Procedure: PARATHYROIDECTOMY;  Surgeon: Earnstine Regal, MD;  Location: [facility];  Service: General;  Laterality: N/A;   . STRESS ECHO REPORT  09-01-2009  DR [place]   NORMAL LVF/  EF 60%/  NORMAL HYPERDYNAMIC RESPONSE  . TILT TABLE STUDY  08-21-2011    DR KLEIN   BP WERE CONSISTENT 110-117 THROUGH OUT STUDY/ BASELINE HR 68-70  . TILT TABLE STUDY N/A 08/21/2011   Procedure: TILT TABLE STUDY;  Surgeon: Deboraha Sprang, MD;  Location: [facility];  Service: Cardiovascular;  Laterality: N/A;  . TONSILLECTOMY  age 24   Past Medical History:  Diagnosis Date  . Abnormal heart rate   . Abnormal uterine bleeding (AUB)   . Allergic rhinitis   . Anemia   . Anxiety   . Asthma   . B12 deficiency   . Bruises easily   . Cat-scratch disease    h/o  . Chest pain   . Chronic back pain    oa  . Chronic fatigue syndrome   . CMV (cytomegalovirus) ([place])    h/o  . Constipation   . Degenerative  disc disease, cervical    and lumbar  . Depression   . Fibrocystic breast disease   . Fibroids   . Fibromyalgia   . GERD (gastroesophageal reflux disease)    watches diet  . H/O hiatal hernia   . H/O myomectomy   . Hashimoto thyroiditis   . History of babesiosis    PARASITITIS  RESOLVED  . History of Bell's palsy    2000--  resolved  . History of concussion    age 45--  no residual  . History of hyperparathyroidism    PRIMARY--  S/P PARATHYROIDECTOMY 01-28-2011  . History of palpitations    stress induced  . History of stomach ulcers   . History of uterine fibroid   . Hypoglycemia   . Hypothyroidism   . Joint pain   . Kidney disease    3A  . Lyme disease    CHRONIC --  FOR 22 YRS (SINCE APPROX 1996)  AND HX CAT SCRATCH DISEASE WHICH HAS RESOLVED  . Musculoskeletal chest pain    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  . Osteoarthritis    all joints secondary to lyme disease/  back is worse  . Palpitations   . SOB (shortness of breath)   . Spinal stenosis   . Swallowing difficulty   . Vitamin B12 deficiency   . Vitamin D deficiency   . Wears contact lenses    LMP 01/22/2011   Opioid Risk Score:   Fall Risk Score:  `1  Depression screen PHQ 2/9  Depression screen PHQ 2/9 01/03/2020  Decreased Interest 2  Down, Depressed, Hopeless 1  PHQ - 2 Score 3  Altered sleeping 1  Tired, decreased energy 2  Change in appetite 2  Feeling bad or failure about yourself  1  Trouble concentrating 1  Moving slowly or fidgety/restless 2  Suicidal thoughts 1  PHQ-9 Score 13  Difficult doing work/chores Somewhat difficult   Review of Systems  Musculoskeletal: Positive for back pain and neck pain.       HANDS, SHOULDER, LEG PAIN  All other systems reviewed and are negative.      Objective:   Physical Exam  Awake, alert, appropriate, sitting on table, NAD MS: 5/5 in UEs- deltoids, biceps, triceps, WE, grip and finger abd B/L LEs- 5/5 in HF, KE, KF, DF and PF B/L  Neuro: Intact  to  light touch in all anterior dermatomes- esp L1- S2 B/L However feels tingling across low back- lumbar paraspinals TTP/with trigger points in B/L rhomboids, levators, upper traps, scalenes, splenius capitus, and deltoids as well as entire spine paraspinals.     Assessment & Plan:    Pt is a 60 yr old female with hx of asthma, DDD, DJD,  Cat scratch disease, Hashimoto's thyroiditis,Lyme's disease, chronic low back pain , fibromyalgia, and generalized chronic pain here for evaluation of pain.   1. Opiate contract  2. UDS  3. Tramadol 50 mg q6 hours as needed- #120 with no refills- can call for refills  4. Magnesium can take 400-600 mg up to 3x/day for muscle spasms  5. Suggest theracane- 2-4 minutes with cane vs tennis balls- hold pressure- for minimum 2 minutes- don't massage-  Can also do 15 minutes on buttocks  6. Estim  unit- will write Rx. Needs new unit- she needs to retard atrophy of her back/core muscles (hard to test actual strength, but pt has difficulty sitting/standing for long periods, so her core muscles ARE weak)- needs assistance controlling pain without medications, getting in and out of bed, taking care of new 44 yr old son, and keeping her moving.    7.  con't PT_ and do the exercises- stretched 5 days/week- pick 5 minutes of exercises-   8. Con't lidocaine patches over the counter.   9. Will do trigger point injections as next visit.   10. F/U in 6-8  weeks  11. TSH 3.9- goal to keep TSH ~ 1.0- it reduces pain dramatically.   I spent a total of 50 minutes on visit- as detailed above.

## 2020-01-12 NOTE — Patient Instructions (Addendum)
Pt is a 59 yr old female with hx of asthma, DDD, DJD,  Cat scratch disease, Hashimoto's thyroiditis,Lyme's disease, chronic low back pain , fibromyalgia, and generalized chronic pain here for evaluation of pain.   1. Opiate contract  2. UDS  3. Tramadol 50 mg q6 hours as needed- #120 with no refills- can call for refills  4. Magnesium can take 400-600 mg up to 3x/day for muscle spasms  5. Suggest theracane- 2-4 minutes with cane vs tennis balls- hold pressure- for minimum 2 minutes- don't massage-  Can also do 15 minutes on buttocks  6. TENS unit- will write Rx. Needs new unit- hers is >10 year sold   7.  con't PT_ and do the exercises- stretched 5 days/week- pick 5 minutes of exercises-   8. Con't lidocaine patches over the counter.   9. Will do trigger point injections as next visit.   10. F/U in 6-8 weeks  11.  Try to get TSH 3.9- goal to keep TSH ~ 1.0- it reduces pain dramatically. Please ask PCP/who ever manages your TSH, please tell them to keep TSH around 1.0. Not low, but around 1.0.

## 2020-01-17 ENCOUNTER — Encounter (INDEPENDENT_AMBULATORY_CARE_PROVIDER_SITE_OTHER): Payer: Self-pay | Admitting: Family Medicine

## 2020-01-17 ENCOUNTER — Other Ambulatory Visit: Payer: Self-pay

## 2020-01-17 ENCOUNTER — Ambulatory Visit (INDEPENDENT_AMBULATORY_CARE_PROVIDER_SITE_OTHER): Payer: 59 | Admitting: Family Medicine

## 2020-01-17 VITALS — BP 143/88 | HR 97 | Temp 97.9°F | Ht 64.0 in | Wt 200.0 lb

## 2020-01-17 DIAGNOSIS — Z6834 Body mass index (BMI) 34.0-34.9, adult: Secondary | ICD-10-CM

## 2020-01-17 DIAGNOSIS — E8881 Metabolic syndrome: Secondary | ICD-10-CM | POA: Diagnosis not present

## 2020-01-17 DIAGNOSIS — Z9189 Other specified personal risk factors, not elsewhere classified: Secondary | ICD-10-CM | POA: Diagnosis not present

## 2020-01-17 DIAGNOSIS — E559 Vitamin D deficiency, unspecified: Secondary | ICD-10-CM

## 2020-01-17 DIAGNOSIS — R7401 Elevation of levels of liver transaminase levels: Secondary | ICD-10-CM

## 2020-01-17 DIAGNOSIS — E669 Obesity, unspecified: Secondary | ICD-10-CM

## 2020-01-17 DIAGNOSIS — E7849 Other hyperlipidemia: Secondary | ICD-10-CM | POA: Diagnosis not present

## 2020-01-17 MED ORDER — VITAMIN D (ERGOCALCIFEROL) 1.25 MG (50000 UNIT) PO CAPS: 50000.0000 [IU] | ORAL_CAPSULE | ORAL | 0 refills | Status: DC

## 2020-01-18 LAB — TOXASSURE SELECT,+ANTIDEPR,UR

## 2020-01-18 NOTE — Progress Notes (Signed)
Chief Complaint:   OBESITY [name] is here to discuss her progress with her obesity treatment plan along with follow-up of her obesity related diagnoses. [name] is on the Category 3 Plan and states she is following her eating plan approximately 35-50% of the time. [name] states she is walking 1 mile for 30 minutes 1 time per week.  Today's visit was #: 2 Starting weight: 201 lbs Starting date: 01/03/2020 Today's weight: 200 lbs Today's date: 01/17/2020 Total lbs lost to date: 1 Total lbs lost since last in-office visit: 1  Interim History: [name] voices it took her a week to get all of the food into the house. Hard time getting breakfast in secondary to her child with ADHD. She did pistachios, carrots, and hummus, and Yasso bars for snacks. For breakfast she is doing bread with peanut butter. Lunch is a frozen meal (no fruit or additional protein). Dinner is additional frozen meal. She really cant do 3 eggs anymore. For Thanksgiving she is going to her in-laws (her mother in-law suffers from anorexia).  Subjective:   1. Transaminitis Danaria's last ALT/AST are elevated, no prior labs in Epic. She denies a history of hepatitis C but has a history of non-alcoholic fatty liver disease. Her ultrasound in 2019 was showing fatty liver. I discussed labs with the patient today.  2. Other hyperlipidemia [name]'s LDL is 126, HDL 52, and triglycerides 114. She is not on statin. I discussed labs with the patient today.  3. Vitamin D deficiency [name] notes fatigue. She is not on Vit D supplementation. I discussed labs with the patient today.  4. Insulin resistance [name]'s last A1c was 5.1 and insulin 11.6. She is not on medications, but still has strong propensity for sweets. I discussed labs with the patient today.  5. At risk for diabetes mellitus [name] is at higher than average risk for developing diabetes due to obesity.   Assessment/Plan:   1. Transaminitis We will refill repeat labs in 3  months. [place] will continue to follow up as directed.  2. Other hyperlipidemia Cardiovascular risk and specific lipid/LDL goals reviewed. We discussed several lifestyle modifications today and [place] will continue to work on diet, exercise and weight loss efforts. We will repeat labs in 3 months. Orders and follow up as documented in patient record.   Counseling Intensive lifestyle modifications are the first line treatment for this issue. . Dietary changes: Increase soluble fiber. Decrease simple carbohydrates. . Exercise changes: Moderate to vigorous-intensity aerobic activity 150 minutes per week if tolerated. . Lipid-lowering medications: see documented in medical record.  3. Vitamin D deficiency Low Vitamin D level contributes to fatigue and are associated with obesity, breast, and colon cancer. [name] agreed to start prescription Vitamin D 50,000 IU every week with no refills. She will follow-up for routine testing of Vitamin D, at least 2-3 times per year to avoid over-replacement.  - Vitamin D, Ergocalciferol, (DRISDOL) 1.25 MG (50000 UNIT) CAPS capsule; Take 1 capsule (50,000 Units total) by mouth every 7 (seven) days.  Dispense: 4 capsule; Refill: 0  4. Insulin resistance [name] will continue to work on weight loss, exercise, and decreasing simple carbohydrates to help decrease the risk of diabetes. We will repeat labs in 3 months, no medications at this time. [name] agreed to follow-up with [name] as directed to closely monitor her progress.  5. At risk for diabetes mellitus [name] was given approximately 30 minutes of diabetes education and counseling today. We discussed intensive lifestyle modifications today with  an emphasis on weight loss as well as increasing exercise and decreasing simple carbohydrates in her diet. We also reviewed medication options with an emphasis on risk versus benefit of those discussed.   Repetitive spaced learning was employed today to elicit superior memory  formation and behavioral change.  6. Class 1 obesity with serious comorbidity and body mass index (BMI) of 34.0 to 34.9 in adult, unspecified obesity type [name] is currently in the action stage of change. As such, her goal is to continue with weight loss efforts. She has agreed to the Category 3 Plan.   Exercise goals: As is.  Behavioral modification strategies: increasing lean protein intake, meal planning and cooking strategies, keeping healthy foods in the home, holiday eating strategies  and planning for success.  [name] has agreed to follow-up with our clinic in 2 weeks with [facility], FNP-C. She was informed of the importance of frequent follow-up visits to maximize her success with intensive lifestyle modifications for her multiple health conditions.   Objective:   Blood pressure (!) 143/88, pulse 97, temperature 97.9 F (36.6 C), temperature source Oral, height 5\' 4"  (1.626 m), weight 200 lb (90.7 kg), last menstrual period 01/22/2011, SpO2 99 %. Body mass index is 34.33 kg/m.  General: Cooperative, alert, well developed, in no acute distress. HEENT: Conjunctivae and lids unremarkable. Cardiovascular: Regular rhythm.  Lungs: Normal work of breathing. Neurologic: No focal deficits.   Lab Results  Component Value Date   CREATININE 0.91 01/03/2020   BUN 10 01/03/2020   NA 140 01/03/2020   K 4.1 01/03/2020   CL 102 01/03/2020   CO2 25 01/03/2020   Lab Results  Component Value Date   ALT 69 (H) 01/03/2020   AST 44 (H) 01/03/2020   ALKPHOS 79 01/03/2020   BILITOT 0.4 01/03/2020   Lab Results  Component Value Date   HGBA1C 5.1 01/03/2020   Lab Results  Component Value Date   INSULIN 11.6 01/03/2020   Lab Results  Component Value Date   TSH 3.920 01/03/2020   Lab Results  Component Value Date   CHOL 198 01/03/2020   HDL 52 01/03/2020   LDLCALC 126 (H) 01/03/2020   TRIG 114 01/03/2020   Lab Results  Component Value Date   WBC 5.1 01/03/2020   HGB 15.1  01/03/2020   HCT 43.8 01/03/2020   MCV 89 01/03/2020   PLT 197 01/03/2020   No results found for: IRON, TIBC, FERRITIN   Attestation Statements:   Reviewed by clinician on day of visit: allergies, medications, problem list, medical history, surgical history, family history, social history, and previous encounter notes.   I, Trixie Dredge, am acting as transcriptionist for Coralie Common, MD.  I have reviewed the above documentation for accuracy and completeness, and I agree with the above. - Jinny Blossom, MD

## 2020-01-31 ENCOUNTER — Telehealth: Payer: Self-pay | Admitting: *Deleted

## 2020-01-31 NOTE — Telephone Encounter (Signed)
Urine drug screen for this encounter is consistent for prescribed medication 

## 2020-02-02 ENCOUNTER — Encounter: Payer: Self-pay | Admitting: Physical Therapy

## 2020-02-02 ENCOUNTER — Ambulatory Visit: Payer: 59 | Attending: Rheumatology | Admitting: Physical Therapy

## 2020-02-02 ENCOUNTER — Other Ambulatory Visit: Payer: Self-pay

## 2020-02-02 DIAGNOSIS — R2689 Other abnormalities of gait and mobility: Secondary | ICD-10-CM | POA: Diagnosis present

## 2020-02-02 DIAGNOSIS — M545 Low back pain, unspecified: Secondary | ICD-10-CM | POA: Diagnosis not present

## 2020-02-02 DIAGNOSIS — M25551 Pain in right hip: Secondary | ICD-10-CM | POA: Diagnosis present

## 2020-02-02 DIAGNOSIS — M25552 Pain in left hip: Secondary | ICD-10-CM | POA: Diagnosis present

## 2020-02-02 DIAGNOSIS — G8929 Other chronic pain: Secondary | ICD-10-CM | POA: Insufficient documentation

## 2020-02-03 ENCOUNTER — Encounter: Payer: Self-pay | Admitting: Physical Therapy

## 2020-02-04 ENCOUNTER — Encounter: Payer: Self-pay | Admitting: Physical Therapy

## 2020-02-04 NOTE — Therapy (Signed)
[place], [place], [address] Phone: [phone]   Fax:  [phone]  Physical Therapy Treatment  Patient Details  Name: [name] MRN: [id number] Date of Birth: 06/24/1960 Referring Provider (PT): Dr Allena Napoleon   Encounter Date: 02/02/2020   PT End of Session - 02/03/20 0905    Visit Number 2    Number of Visits 12    Date for PT Re-Evaluation 02/07/20    Authorization Type [employer]    PT Start Time 5625    PT Stop Time 1056    PT Time Calculation (min) 41 min    Activity Tolerance Patient tolerated treatment well    Behavior During Therapy [facility] for tasks assessed/performed           Past Medical History:  Diagnosis Date  . Abnormal heart rate   . Abnormal uterine bleeding (AUB)   . Allergic rhinitis   . Anemia   . Anxiety   . Asthma   . B12 deficiency   . Bruises easily   . Cat-scratch disease    h/o  . Chest pain   . Chronic back pain    oa  . Chronic fatigue syndrome   . CMV (cytomegalovirus) ([place])    h/o  . Constipation   . Degenerative disc disease, cervical    and lumbar  . Depression   . Fibrocystic breast disease   . Fibroids   . Fibromyalgia   . GERD (gastroesophageal reflux disease)    watches diet  . H/O hiatal hernia   . H/O myomectomy   . Hashimoto thyroiditis   . History of babesiosis    PARASITITIS RESOLVED  . History of Bell's palsy    2000--  resolved  . History of concussion    age 57--  no residual  . History of hyperparathyroidism    PRIMARY--  S/P PARATHYROIDECTOMY 01-28-2011  . History of palpitations    stress induced  . History of stomach ulcers   . History of uterine fibroid   . Hypoglycemia   . Hypothyroidism   . Joint pain   . Kidney disease    3A  . Lyme disease    CHRONIC --  FOR 22 YRS (SINCE APPROX 1996)  AND HX CAT SCRATCH DISEASE WHICH HAS RESOLVED  . Musculoskeletal chest pain    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  .  Osteoarthritis    all joints secondary to lyme disease/  back is worse  . Palpitations   . SOB (shortness of breath)   . Spinal stenosis   . Swallowing difficulty   . Vitamin B12 deficiency   . Vitamin D deficiency   . Wears contact lenses     Past Surgical History:  Procedure Laterality Date  . COLONOSCOPY  12/2017  . DILATATION & CURRETTAGE/HYSTEROSCOPY WITH RESECTOCOPE N/A 12/02/2012   Procedure: DILATATION & CURETTAGE/HYSTEROSCOPY WITH RESECTOCOPE;  Surgeon: Darlyn Chamber, MD;  Location: [facility];  Service: Gynecology;  Laterality: N/A;  . GANGLION CYST EXCISION  2007   HAND  . HERNIA REPAIR  2010   Exploratory  . HYSTEROSCOPIC MYOMECTOMY AND ABDOMINAL MYOMECTOMY  2006  . PARATHYROIDECTOMY  01/28/2011   Procedure: PARATHYROIDECTOMY;  Surgeon: Earnstine Regal, MD;  Location: [facility];  Service: General;  Laterality: N/A;   . STRESS ECHO REPORT  09-01-2009  DR [place]   NORMAL LVF/  EF 60%/  NORMAL HYPERDYNAMIC RESPONSE  . TILT TABLE STUDY  08-21-2011  DR KLEIN   BP WERE CONSISTENT 110-117 THROUGH OUT STUDY/ BASELINE HR 68-70  . TILT TABLE STUDY N/A 08/21/2011   Procedure: TILT TABLE STUDY;  Surgeon: Deboraha Sprang, MD;  Location: [facility] CATH [facility];  Service: Cardiovascular;  Laterality: N/A;  . TONSILLECTOMY  age 61    There were no vitals filed for this visit.   Subjective Assessment - 02/04/20 0738    Subjective Patient continues to have pain and spasming. She has not been able to do much with her exercises. She has had some issues to deal with at home. She has had some cramping in her thighs and calfs.    Pertinent History fibromyalgia, OA, lymes disease, irregular heart beat, anxiety, cat scratch disease,    How long can you sit comfortably? cant sit in one position for too long ebfore she has to shift    How long can you stand comfortably? can stand without much of an issue    How long can you walk comfortably? walking helps    Diagnostic tests lumbar x-ray: mild  sputring and degeneration; thoracic spine pain mild degeneration. All imaging done in 2019    Patient Stated Goals to have less pain; to be more active    Currently in Pain? Yes    Pain Score 6     Pain Location Back    Pain Orientation Right;Left    Pain Descriptors / Indicators Aching    Pain Type Chronic pain    Pain Onset More than a month ago    Pain Frequency Constant    Aggravating Factors  standing and walking    Pain Relieving Factors rest    Effect of Pain on Daily Activities pain with daily activity                             [facility] Adult PT Treatment/Exercise - 02/04/20 0001      Lumbar Exercises: Stretches   Active Hamstring Stretch Limitations 3x20 sec hold    Lower Trunk Rotation Limitations LTR with mod cuing for amplitude     Piriformis Stretch Limitations for gluteal 2x20 sec hold reviewed in sitting    Other Lumbar Stretch Exercise reviewed for HEP      Lumbar Exercises: Seated   Other Seated Lumbar Exercises seated hip abduction 2x10 red      Manual Therapy   Manual Therapy Soft tissue mobilization;Joint mobilization;Manual Traction    Soft tissue mobilization trigger point release to lumbar spine    Manual Traction LAD 3x20 sec hold                  PT Education - 02/03/20 0904    Education Details improtance of exercise and stretching, explained the benefits and rsiks odf TPDN    Person(s) Educated Patient    Methods Explanation;Tactile cues;Demonstration    Comprehension Verbalized understanding;Verbal cues required;Returned demonstration;Tactile cues required            PT Short Term Goals - 12/27/19 1408      PT SHORT TERM GOAL #1   Title Patient will increased bilateral passive hip flexion to 100 dgrees without pain    Time 3    Period Weeks    Status New    Target Date 01/17/20      PT SHORT TERM GOAL #2   Title Patient will increase bilateral hip flexion strength to 5/5    Time 3    Period  Weeks    Status  New    Target Date 01/17/20      PT SHORT TERM GOAL #3   Title Patient will demonstrate full left lumbar rotation without pain    Time 3    Period Weeks    Status New    Target Date 01/17/20             PT Long Term Goals - 12/27/19 1410      PT LONG TERM GOAL #1   Title Patient will return to walking or light jogging without increased pain    Time 6    Period Weeks    Status New    Target Date 02/07/20      PT LONG TERM GOAL #2   Title Patient will be indepdnent with program for continued core strengthening and stretching    Time 6    Period Weeks    Status New    Target Date 02/07/20      PT LONG TERM GOAL #3   Title Patient will report 2/10 pain in back and hips while perfroming ADL's    Time 6    Period Weeks    Status New    Target Date 02/07/20                 Plan - 02/04/20 2130    Clinical Impression Statement Therapy advised the patient that she needs to at least set aside 10 minutes to do some ecxercise and stretching for herself if she hopes that her back pain is going to get better. She was shown her stretches again. She was advised to try them when they hurt. She dosent need to make special time for her stretches, she just needs to do themwhen she is getting painful. She was also shown light seated ther-ex to perfrom. She continues to have spasming and tightness in her hips. Therapy perfromed trigger point release to lumbar spien and gluteals and alsoperfromed AD with occilations to decrease pain and inflammation. She hads not made much progress but she has not done anything sincethe intial eval. She was encouraged to try her HEP over the week that way we know what is working and nit working.    Personal Factors and Comorbidities Comorbidity 1;Comorbidity 2;Comorbidity 3+;Fitness;Time since onset of injury/illness/exacerbation    Comorbidities Lymes disease, OA, anxiety,    Examination-Activity Limitations Bed Mobility;Sit;Lift     Examination-Participation Restrictions Cleaning;Community Activity    Stability/Clinical Decision Making Evolving/Moderate complexity    Clinical Decision Making Moderate    Rehab Potential Good    PT Frequency 2x / week    PT Duration 6 weeks    PT Treatment/Interventions ADLs/Self Care Home Management;Electrical Stimulation;Iontophoresis 4mg /ml Dexamethasone;Traction;Ultrasound;Moist Heat;DME Instruction;Stair training;Gait training;Functional mobility training;Therapeutic activities;Therapeutic exercise;Neuromuscular re-education;Patient/family education;Manual techniques;Passive range of motion;Taping;Wheelchair mobility training;Dry needling    PT Next Visit Plan soft tissue mobilization and decrmpression to both hips, begin strengthening; consider supien march; posterior pelvic til; review breathing; add single knee to chest stretch if able; progress standing exercises as tolerated    PT Home Exercise Plan tennis ball trigger point release; glut stretch; LTR; seated clamshell, seated hamstring stretch: same code given from intial eval    Consulted and Agree with Plan of Care Patient           Patient will benefit from skilled therapeutic intervention in order to improve the following deficits and impairments:  Abnormal gait,Difficulty walking,Pain,Decreased activity tolerance,Decreased mobility,Decreased strength,Impaired flexibility,Decreased safety awareness,Increased muscle spasms  Visit  Diagnosis: Chronic bilateral low back pain without sciatica  Pain in left hip  Pain in right hip  Other abnormalities of gait and mobility     Problem List Patient Active Problem List   Diagnosis Date Noted  . Myofascial pain 01/12/2020  . Acute post-traumatic headache, not intractable 06/02/2018  . Fibromyalgia 06/05/2016  . Other insomnia 06/05/2016  . Other fatigue 06/05/2016  . DDD (degenerative disc disease), cervical 06/05/2016  . DDD (degenerative disc disease), lumbar 06/05/2016   . Osteopenia of multiple sites 06/05/2016  . History of asthma 06/05/2016  . History of Hashimoto thyroiditis 06/05/2016  . History of anxiety 06/05/2016  . Seasonal affective disorder ([place]) 06/05/2016  . Postmenopausal bleeding 12/02/2012    Class: Present on Admission  . Fibroids, submucosal 12/02/2012    Class: Present on Admission  . Hx of Lyme disease 06/02/2012  . Postural dizziness 08/08/2011  . Hyperparathyroidism, primary ([place]) 01/08/2011    Carney Living PT DPT  02/04/2020, 7:53 AM  [facility] [address] [place], [place], [address] Phone: [phone]   Fax:  [phone]  Name: [name] MRN: [id number] Date of Birth: [birth year]

## 2020-02-07 ENCOUNTER — Ambulatory Visit (INDEPENDENT_AMBULATORY_CARE_PROVIDER_SITE_OTHER): Payer: 59 | Admitting: Family Medicine

## 2020-02-07 ENCOUNTER — Encounter (INDEPENDENT_AMBULATORY_CARE_PROVIDER_SITE_OTHER): Payer: Self-pay | Admitting: Family Medicine

## 2020-02-07 ENCOUNTER — Other Ambulatory Visit: Payer: Self-pay

## 2020-02-07 VITALS — BP 121/81 | HR 85 | Temp 98.1°F | Ht 64.0 in | Wt 198.0 lb

## 2020-02-07 DIAGNOSIS — E669 Obesity, unspecified: Secondary | ICD-10-CM | POA: Diagnosis not present

## 2020-02-07 DIAGNOSIS — Z6834 Body mass index (BMI) 34.0-34.9, adult: Secondary | ICD-10-CM

## 2020-02-07 DIAGNOSIS — E88819 Insulin resistance, unspecified: Secondary | ICD-10-CM | POA: Insufficient documentation

## 2020-02-07 DIAGNOSIS — E8881 Metabolic syndrome: Secondary | ICD-10-CM | POA: Diagnosis not present

## 2020-02-07 DIAGNOSIS — E559 Vitamin D deficiency, unspecified: Secondary | ICD-10-CM | POA: Diagnosis not present

## 2020-02-07 DIAGNOSIS — Z9189 Other specified personal risk factors, not elsewhere classified: Secondary | ICD-10-CM | POA: Diagnosis not present

## 2020-02-07 MED ORDER — VITAMIN D (ERGOCALCIFEROL) 1.25 MG (50000 UNIT) PO CAPS: 50000.0000 [IU] | ORAL_CAPSULE | ORAL | 0 refills | Status: DC

## 2020-02-07 NOTE — Progress Notes (Signed)
Chief Complaint:   OBESITY [name] is here to discuss her progress with her obesity treatment plan along with follow-up of her obesity related diagnoses. [name] is on the Category 3 Plan and states she is following her eating plan approximately 75% of the time. [name] states she is doing 0 minutes 0 times per week.  Today's visit was #: 3 Starting weight: 201 lbs Starting date: 01/03/2020 Today's weight: 198 lbs Today's date: 02/07/2020 Total lbs lost to date: 3 Total lbs lost since last in-office visit: 2  Interim History: [name] notes some hunger between meals. She struggles to get in breakfast but reports she usually does get it in.  She struggles to meal prep due to caring for her 23 yo son with sever ADHD.Marland Kitchen She will visit her mother in-law at Christmas, who has anorexia. She tends to lose weight when she visits her mother in-law.  Subjective:   1. Vitamin D deficiency [name]'s last Vit D level was low at 35. She is on weekly prescription Vit D.  2. Insulin resistance [name] has a diagnosis of insulin resistance based on her elevated fasting insulin level >5. She notes polyphagia but she does not eat all of the prescribed food.  She is not on metformin.   Lab Results  Component Value Date   INSULIN 11.6 01/03/2020   Lab Results  Component Value Date   HGBA1C 5.1 01/03/2020   3. At risk for deficient intake of food The patient is at a higher than average risk of deficient intake of food due to omitting food prescribed on the meal plan.   Assessment/Plan:   1. Vitamin D deficiency . We will refill prescription Vitamin D for 1 month. [name] will follow-up for routine testing of Vitamin D, at least 2-3 times per year to avoid over-replacement.  - Vitamin D, Ergocalciferol, (DRISDOL) 1.25 MG (50000 UNIT) CAPS capsule; Take 1 capsule (50,000 Units total) by mouth every 7 (seven) days.  Dispense: 4 capsule; Refill: 0  2. Insulin resistance [name] will continue her meal plan, and  will continue to work on weight loss, exercise, and decreasing simple carbohydrates to help decrease the risk of diabetes.  3. At risk for deficient intake of food [name] was given approximately 15 minutes of deficit intake of food prevention counseling today. [name] is at risk for eating too few calories based on current food recall. She was encouraged to focus on meeting caloric and protein goals according to her recommended meal plan.   4. Class 1 obesity with serious comorbidity and body mass index (BMI) of 34.0 to 34.9 in adult, unspecified obesity type [name] is currently in the action stage of change. As such, her goal is to continue with weight loss efforts. She has agreed to the Category 3 Plan.   I provided protein goals for all meals to the patient today.  Exercise goals: No exercise has been prescribed at this time.  Behavioral modification strategies: increasing lean protein intake, meal planning and cooking strategies and travel eating strategies.  Divina has agreed to follow-up with our clinic in 3 weeks.   Objective:   Blood pressure 121/81, pulse 85, temperature 98.1 F (36.7 C), height 5\' 4"  (1.626 m), weight 198 lb (89.8 kg), last menstrual period 01/22/2011, SpO2 98 %. Body mass index is 33.99 kg/m.  General: Cooperative, alert, well developed, in no acute distress. HEENT: Conjunctivae and lids unremarkable. Cardiovascular: Regular rhythm.  Lungs: Normal work of breathing. Neurologic: No focal deficits.  Lab Results  Component Value Date   CREATININE 0.91 01/03/2020   BUN 10 01/03/2020   NA 140 01/03/2020   K 4.1 01/03/2020   CL 102 01/03/2020   CO2 25 01/03/2020   Lab Results  Component Value Date   ALT 69 (H) 01/03/2020   AST 44 (H) 01/03/2020   ALKPHOS 79 01/03/2020   BILITOT 0.4 01/03/2020   Lab Results  Component Value Date   HGBA1C 5.1 01/03/2020   Lab Results  Component Value Date   INSULIN 11.6 01/03/2020   Lab Results  Component Value  Date   TSH 3.920 01/03/2020   Lab Results  Component Value Date   CHOL 198 01/03/2020   HDL 52 01/03/2020   LDLCALC 126 (H) 01/03/2020   TRIG 114 01/03/2020   Lab Results  Component Value Date   WBC 5.1 01/03/2020   HGB 15.1 01/03/2020   HCT 43.8 01/03/2020   MCV 89 01/03/2020   PLT 197 01/03/2020   No results found for: IRON, TIBC, FERRITIN  Attestation Statements:   Reviewed by clinician on day of visit: allergies, medications, problem list, medical history, surgical history, family history, social history, and previous encounter notes.   Wilhemena Durie, am acting as Location manager for [employer], FNP-C.  I have reviewed the above documentation for accuracy and completeness, and I agree with the above. -  Georgianne Fick, FNP

## 2020-02-09 ENCOUNTER — Ambulatory Visit: Payer: 59 | Admitting: Physical Therapy

## 2020-02-09 ENCOUNTER — Encounter: Payer: Self-pay | Admitting: Physical Therapy

## 2020-02-09 ENCOUNTER — Other Ambulatory Visit: Payer: Self-pay

## 2020-02-09 DIAGNOSIS — G8929 Other chronic pain: Secondary | ICD-10-CM

## 2020-02-09 DIAGNOSIS — M25551 Pain in right hip: Secondary | ICD-10-CM

## 2020-02-09 DIAGNOSIS — M545 Low back pain, unspecified: Secondary | ICD-10-CM | POA: Diagnosis not present

## 2020-02-09 DIAGNOSIS — M25552 Pain in left hip: Secondary | ICD-10-CM

## 2020-02-09 DIAGNOSIS — R2689 Other abnormalities of gait and mobility: Secondary | ICD-10-CM

## 2020-02-10 NOTE — Therapy (Signed)
[place], [place], [address] Phone: [phone]   Fax:  [phone]  Physical Therapy Treatment  Patient Details  Name: [name] MRN: [id number] Date of Birth: 09/02/1960 Referring Provider (PT): Dr Allena Napoleon   Encounter Date: 02/09/2020   PT End of Session - 02/09/20 1044    Visit Number 3    Number of Visits 12    Date for PT Re-Evaluation 02/07/20    Authorization Type [employer]    PT Start Time 1016    PT Stop Time 1058    PT Time Calculation (min) 42 min    Activity Tolerance Patient tolerated treatment well    Behavior During Therapy [facility] for tasks assessed/performed           Past Medical History:  Diagnosis Date  . Abnormal heart rate   . Abnormal uterine bleeding (AUB)   . Allergic rhinitis   . Anemia   . Anxiety   . Asthma   . B12 deficiency   . Bruises easily   . Cat-scratch disease    h/o  . Chest pain   . Chronic back pain    oa  . Chronic fatigue syndrome   . CMV (cytomegalovirus) ([place])    h/o  . Constipation   . Degenerative disc disease, cervical    and lumbar  . Depression   . Fibrocystic breast disease   . Fibroids   . Fibromyalgia   . GERD (gastroesophageal reflux disease)    watches diet  . H/O hiatal hernia   . H/O myomectomy   . Hashimoto thyroiditis   . History of babesiosis    PARASITITIS RESOLVED  . History of Bell's palsy    2000--  resolved  . History of concussion    age 6--  no residual  . History of hyperparathyroidism    PRIMARY--  S/P PARATHYROIDECTOMY 01-28-2011  . History of palpitations    stress induced  . History of stomach ulcers   . History of uterine fibroid   . Hypoglycemia   . Hypothyroidism   . Joint pain   . Kidney disease    3A  . Lyme disease    CHRONIC --  FOR 22 YRS (SINCE APPROX 1996)  AND HX CAT SCRATCH DISEASE WHICH HAS RESOLVED  . Musculoskeletal chest pain    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  .  Osteoarthritis    all joints secondary to lyme disease/  back is worse  . Palpitations   . SOB (shortness of breath)   . Spinal stenosis   . Swallowing difficulty   . Vitamin B12 deficiency   . Vitamin D deficiency   . Wears contact lenses     Past Surgical History:  Procedure Laterality Date  . COLONOSCOPY  12/2017  . DILATATION & CURRETTAGE/HYSTEROSCOPY WITH RESECTOCOPE N/A 12/02/2012   Procedure: DILATATION & CURETTAGE/HYSTEROSCOPY WITH RESECTOCOPE;  Surgeon: Darlyn Chamber, MD;  Location: [facility];  Service: Gynecology;  Laterality: N/A;  . GANGLION CYST EXCISION  2007   HAND  . HERNIA REPAIR  2010   Exploratory  . HYSTEROSCOPIC MYOMECTOMY AND ABDOMINAL MYOMECTOMY  2006  . PARATHYROIDECTOMY  01/28/2011   Procedure: PARATHYROIDECTOMY;  Surgeon: Earnstine Regal, MD;  Location: [facility];  Service: General;  Laterality: N/A;   . STRESS ECHO REPORT  09-01-2009  DR Martinique   NORMAL LVF/  EF 60%/  NORMAL HYPERDYNAMIC RESPONSE  . TILT TABLE STUDY  08-21-2011  DR KLEIN   BP WERE CONSISTENT 110-117 THROUGH OUT STUDY/ BASELINE HR 68-70  . TILT TABLE STUDY N/A 08/21/2011   Procedure: TILT TABLE STUDY;  Surgeon: Deboraha Sprang, MD;  Location: [facility];  Service: Cardiovascular;  Laterality: N/A;  . TONSILLECTOMY  age 4    There were no vitals filed for this visit.   Subjective Assessment - 02/09/20 1022    Subjective Patien reports her back may be a little better. She feels like it may be because of the tramadol.    Pertinent History fibromyalgia, OA, lymes disease, irregular heart beat, anxiety, cat scratch disease,    How long can you sit comfortably? cant sit in one position for too long ebfore she has to shift    How long can you stand comfortably? can stand without much of an issue    How long can you walk comfortably? walking helps    Diagnostic tests lumbar x-ray: mild sputring and degeneration; thoracic spine pain mild degeneration. All imaging done in 2019     Patient Stated Goals to have less pain; to be more active    Currently in Pain? Yes    Pain Score 4     Pain Location Back    Pain Orientation Right;Left    Pain Descriptors / Indicators Aching    Pain Onset More than a month ago    Pain Frequency Constant    Aggravating Factors  standing and walking    Pain Relieving Factors rest    Effect of Pain on Daily Activities pain with daily activity    Multiple Pain Sites No                             [facility] Adult PT Treatment/Exercise - 02/10/20 0001      Lumbar Exercises: Stretches   Active Hamstring Stretch Limitations 3x20 sec hold    Lower Trunk Rotation Limitations LTR with mod cuing for amplitude     Piriformis Stretch Limitations for gluteal 2x20 sec hold reviewed in sitting      Lumbar Exercises: Supine   Clam Limitations supine clamshell    Bridge Limitations x20    Other Supine Lumbar Exercises supine march 2x10      Manual Therapy   Manual Therapy Soft tissue mobilization;Joint mobilization;Manual Traction    Soft tissue mobilization trigger point release to lumbar spine    Manual Traction LAD 3x20 sec hold                  PT Education - 02/09/20 1037    Education Details reviewed HEp and the importance of therapy    Person(s) Educated Patient    Methods Explanation    Comprehension Verbalized understanding            PT Short Term Goals - 12/27/19 1408      PT SHORT TERM GOAL #1   Title Patient will increased bilateral passive hip flexion to 100 dgrees without pain    Time 3    Period Weeks    Status New    Target Date 01/17/20      PT SHORT TERM GOAL #2   Title Patient will increase bilateral hip flexion strength to 5/5    Time 3    Period Weeks    Status New    Target Date 01/17/20      PT SHORT TERM GOAL #3   Title Patient will demonstrate full  left lumbar rotation without pain    Time 3    Period Weeks    Status New    Target Date 01/17/20             PT Long  Term Goals - 12/27/19 1410      PT LONG TERM GOAL #1   Title Patient will return to walking or light jogging without increased pain    Time 6    Period Weeks    Status New    Target Date 02/07/20      PT LONG TERM GOAL #2   Title Patient will be indepdnent with program for continued core strengthening and stretching    Time 6    Period Weeks    Status New    Target Date 02/07/20      PT LONG TERM GOAL #3   Title Patient will report 2/10 pain in back and hips while perfroming ADL's    Time 6    Period Weeks    Status New    Target Date 02/07/20                 Plan - 02/09/20 1051    Clinical Impression Statement Patient continues to have spasming in her back but it may be alittle better. She tolerated ther-ex well. therapy gave her upper body exercises to work on at home. Patient was given seated exercises as well as mat exercises to perfrom at home. She was given a goal to try to do the exercises 3-4x before her next visit next Wedenesday. She reports life isssues are preventing herself from perfroming exercises and stretching.    Comorbidities Lymes disease, OA, anxiety,    Examination-Participation Restrictions Cleaning;Community Activity    Stability/Clinical Decision Making Evolving/Moderate complexity    Clinical Decision Making Moderate    Rehab Potential Good    PT Frequency 2x / week    PT Duration 6 weeks    PT Treatment/Interventions ADLs/Self Care Home Management;Electrical Stimulation;Iontophoresis 4mg /ml Dexamethasone;Traction;Ultrasound;Moist Heat;DME Instruction;Stair training;Gait training;Functional mobility training;Therapeutic activities;Therapeutic exercise;Neuromuscular re-education;Patient/family education;Manual techniques;Passive range of motion;Taping;Wheelchair mobility training;Dry needling    PT Next Visit Plan soft tissue mobilization and decrmpression to both hips, begin strengthening; consider supien march; posterior pelvic til; review  breathing; add single knee to chest stretch if able; progress standing exercises as tolerated    PT Home Exercise Plan tennis ball trigger point release; glut stretch; LTR; seated clamshell, seated hamstring stretch: same code given from intial eval    Consulted and Agree with Plan of Care Patient           Patient will benefit from skilled therapeutic intervention in order to improve the following deficits and impairments:  Abnormal gait,Difficulty walking,Pain,Decreased activity tolerance,Decreased mobility,Decreased strength,Impaired flexibility,Decreased safety awareness,Increased muscle spasms  Visit Diagnosis: Chronic bilateral low back pain without sciatica  Pain in left hip  Pain in right hip  Other abnormalities of gait and mobility     Problem List Patient Active Problem List   Diagnosis Date Noted  . Vitamin D deficiency 02/07/2020  . Insulin resistance 02/07/2020  . Class 1 obesity with serious comorbidity and body mass index (BMI) of 34.0 to 34.9 in adult 02/07/2020  . Myofascial pain 01/12/2020  . Acute post-traumatic headache, not intractable 06/02/2018  . Fibromyalgia 06/05/2016  . Other insomnia 06/05/2016  . Other fatigue 06/05/2016  . DDD (degenerative disc disease), cervical 06/05/2016  . DDD (degenerative disc disease), lumbar 06/05/2016  . Osteopenia of multiple sites 06/05/2016  .  History of asthma 06/05/2016  . History of Hashimoto thyroiditis 06/05/2016  . History of anxiety 06/05/2016  . Seasonal affective disorder ([place]) 06/05/2016  . Postmenopausal bleeding 12/02/2012    Class: Present on Admission  . Fibroids, submucosal 12/02/2012    Class: Present on Admission  . Hx of Lyme disease 06/02/2012  . Postural dizziness 08/08/2011  . Hyperparathyroidism, primary ([place]) 01/08/2011    Carney Living  PT DPT  02/10/2020, 8:24 AM  [facility] [address] [place], [place], [address] Phone:  [phone]   Fax:  [phone]  Name: [name] MRN: [id number] Date of Birth: 1960-03-13

## 2020-02-16 ENCOUNTER — Encounter: Payer: Self-pay | Admitting: Physical Therapy

## 2020-02-16 ENCOUNTER — Other Ambulatory Visit: Payer: Self-pay

## 2020-02-16 ENCOUNTER — Ambulatory Visit: Payer: 59 | Admitting: Physical Therapy

## 2020-02-16 DIAGNOSIS — R2689 Other abnormalities of gait and mobility: Secondary | ICD-10-CM

## 2020-02-16 DIAGNOSIS — M25552 Pain in left hip: Secondary | ICD-10-CM

## 2020-02-16 DIAGNOSIS — M25551 Pain in right hip: Secondary | ICD-10-CM

## 2020-02-16 DIAGNOSIS — M545 Low back pain, unspecified: Secondary | ICD-10-CM

## 2020-02-16 DIAGNOSIS — G8929 Other chronic pain: Secondary | ICD-10-CM

## 2020-02-16 NOTE — Therapy (Addendum)
[place], [place], [address] Phone: [phone]   Fax:  [phone]  Physical Therapy Treatment/Discharge   Patient Details  Name: [name] MRN: [id number] Date of Birth: [birth year] Referring Provider (PT): Dr Allena Napoleon   Encounter Date: 02/16/2020   PT End of Session - 02/16/20 1903    Visit Number 4    Number of Visits 12    Date for PT Re-Evaluation 02/07/20    Authorization Type [employer]    PT Start Time 1015    PT Stop Time 1056    PT Time Calculation (min) 41 min    Activity Tolerance Patient tolerated treatment well    Behavior During Therapy [facility] for tasks assessed/performed           Past Medical History:  Diagnosis Date  . Abnormal heart rate   . Abnormal uterine bleeding (AUB)   . Allergic rhinitis   . Anemia   . Anxiety   . Asthma   . B12 deficiency   . Bruises easily   . Cat-scratch disease    h/o  . Chest pain   . Chronic back pain    oa  . Chronic fatigue syndrome   . CMV (cytomegalovirus) ([place])    h/o  . Constipation   . Degenerative disc disease, cervical    and lumbar  . Depression   . Fibrocystic breast disease   . Fibroids   . Fibromyalgia   . GERD (gastroesophageal reflux disease)    watches diet  . H/O hiatal hernia   . H/O myomectomy   . Hashimoto thyroiditis   . History of babesiosis    PARASITITIS RESOLVED  . History of Bell's palsy    2000--  resolved  . History of concussion    age 59--  no residual  . History of hyperparathyroidism    PRIMARY--  S/P PARATHYROIDECTOMY 01-28-2011  . History of palpitations    stress induced  . History of stomach ulcers   . History of uterine fibroid   . Hypoglycemia   . Hypothyroidism   . Joint pain   . Kidney disease    3A  . Lyme disease    CHRONIC --  FOR 22 YRS (SINCE APPROX 1996)  AND HX CAT SCRATCH DISEASE WHICH HAS RESOLVED  . Musculoskeletal chest pain    seconadary to lyme  disease  . Osteoarthritis    all joints secondary to lyme disease/  back is worse  . Palpitations   . SOB (shortness of breath)   . Spinal stenosis   . Swallowing difficulty   . Vitamin B12 deficiency   . Vitamin D deficiency   . Wears contact lenses     Past Surgical History:  Procedure Laterality Date  . COLONOSCOPY  12/2017  . DILATATION & CURRETTAGE/HYSTEROSCOPY WITH RESECTOCOPE N/A 12/02/2012   Procedure: DILATATION & CURETTAGE/HYSTEROSCOPY WITH RESECTOCOPE;  Surgeon: Darlyn Chamber, MD;  Location: [facility];  Service: Gynecology;  Laterality: N/A;  . GANGLION CYST EXCISION  2007   HAND  . HERNIA REPAIR  2010   Exploratory  . HYSTEROSCOPIC MYOMECTOMY AND ABDOMINAL MYOMECTOMY  2006  . PARATHYROIDECTOMY  01/28/2011   Procedure: PARATHYROIDECTOMY;  Surgeon: Earnstine Regal, MD;  Location[facility];  Service: General;  Laterality: N/A;   . STRESS ECHO REPORT  09-01-2009  DR [place]   NORMAL LVF/  EF 60%/  NORMAL HYPERDYNAMIC RESPONSE  . TILT TABLE STUDY  08-21-2011  DR KLEIN   BP WERE CONSISTENT 110-117 THROUGH OUT STUDY/ BASELINE HR 68-70  . TILT TABLE STUDY N/A 08/21/2055   Procedure: TILT TABLE STUDY;  Surgeon: Deboraha Sprang, MD;  Location: [facility] CATH LAB;  Service: Cardiovascular;  Laterality: N/A;  . TONSILLECTOMY  age 59    There were no vitals filed for this visit.   Subjective Assessment - 02/16/20 1019    Subjective Patient reports she has been a little better. She has no been working on her exercises.    Pertinent History fibromyalgia, OA, lymes disease, irregular heart beat, anxiety, cat scratch disease,    How long can you sit comfortably? cant sit in one position for too long ebfore she has to shift    How long can you stand comfortably? can stand without much of an issue    How long can you walk comfortably? walking helps    Diagnostic tests lumbar x-ray: mild sputring and degeneration; thoracic spine pain mild degeneration. All imaging done in 2019     Patient Stated Goals to have less pain; to be more active    Currently in Pain? Yes    Pain Score 3     Pain Location Back    Pain Orientation Right;Left    Pain Descriptors / Indicators Aching    Pain Type Chronic pain    Pain Onset More than a month ago    Pain Frequency Constant    Aggravating Factors  standing and walking    Pain Relieving Factors rest                             [facility] Adult PT Treatment/Exercise - 02/16/20 0001      Lumbar Exercises: Stretches   Active Hamstring Stretch Limitations 3x20 sec hold    Lower Trunk Rotation Limitations LTR with mod cuing for amplitude     Piriformis Stretch Limitations for gluteal 2x20 sec hold reviewed in sitting    Other Lumbar Stretch Exercise reviewed stretching for when she is driving      Lumbar Exercises: Standing   Other Standing Lumbar Exercises scap retraction 2x10 red; shoulder extension 2x10 red      Lumbar Exercises: Supine   Clam Limitations supine clamshell    Bridge Limitations x20    Other Supine Lumbar Exercises supine march 2x10      Manual Therapy   Manual Therapy Soft tissue mobilization;Joint mobilization;Manual Traction    Soft tissue mobilization trigger point release to right mid thoracic paraspinalsa and upper lumbar parapsinals    Manual Traction LAD 3x20 sec hold                  PT Education - 02/16/20 1902    Education Details HEP andsymtpom mangagement    Person(s) Educated Patient    Methods Explanation;Demonstration;Tactile cues;Verbal cues    Comprehension Returned demonstration;Verbal cues required;Verbalized understanding            PT Short Term Goals - 12/27/19 1408      PT SHORT TERM GOAL #1   Title Patient will increased bilateral passive hip flexion to 100 dgrees without pain    Time 3    Period Weeks    Status New    Target Date 01/17/20      PT SHORT TERM GOAL #2   Title Patient will increase bilateral hip flexion strength to 5/5    Time 3     Period Weeks  Status New    Target Date 01/17/20      PT SHORT TERM GOAL #3   Title Patient will demonstrate full left lumbar rotation without pain    Time 3    Period Weeks    Status New    Target Date 01/17/20             PT Long Term Goals - 12/27/19 1410      PT LONG TERM GOAL #1   Title Patient will return to walking or light jogging without increased pain    Time 6    Period Weeks    Status New    Target Date 02/07/20      PT LONG TERM GOAL #2   Title Patient will be indepdnent with program for continued core strengthening and stretching    Time 6    Period Weeks    Status New    Target Date 02/07/20      PT LONG TERM GOAL #3   Title Patient will report 2/10 pain in back and hips while perfroming ADL's    Time 6    Period Weeks    Status New    Target Date 02/07/20                 Plan - 02/16/20 1907    Clinical Impression Statement Depsite not sdoing her HEP, the patient appears to be making progress. She had more mid thoraic / upper lumbar pain today. She had very little spasming in her lumbar spine. She has had pain on certain days but overall apppears to be improving. She was given standing exercises for HEP today to try to give her some variety in her exercises.Therapy continues to encourage her to workon her HEP. She will be going to [place] for 10 days. She was encouragedto use her stretches when she stops on the drives down to [place]. Therapy will continue to advance core exercises.    Personal Factors and Comorbidities Comorbidity 1;Comorbidity 2;Comorbidity 3+;Fitness;Time since onset of injury/illness/exacerbation    Comorbidities Lymes disease, OA, anxiety,    Examination-Activity Limitations Bed Mobility;Sit;Lift    Examination-Participation Restrictions Cleaning;Community Activity    Stability/Clinical Decision Making Evolving/Moderate complexity    Clinical Decision Making Moderate    Rehab Potential Good    PT Frequency 2x / week     PT Duration 6 weeks    PT Treatment/Interventions ADLs/Self Care Home Management;Electrical Stimulation;Iontophoresis 30m/ml Dexamethasone;Traction;Ultrasound;Moist Heat;DME Instruction;Stair training;Gait training;Functional mobility training;Therapeutic activities;Therapeutic exercise;Neuromuscular re-education;Patient/family education;Manual techniques;Passive range of motion;Taping;Wheelchair mobility training;Dry needling    PT Next Visit Plan soft tissue mobilization and decrmpression to both hips, begin strengthening; consider supien march; posterior pelvic til; review breathing; add single knee to chest stretch if able; progress standing exercises as tolerated    PT Home Exercise Plan tennis ball trigger point release; glut stretch; LTR; seated clamshell, seated hamstring stretch: same code given from intial eval; scap retraction; shoudler extension    Consulted and Agree with Plan of Care Patient           Patient will benefit from skilled therapeutic intervention in order to improve the following deficits and impairments:  Abnormal gait,Difficulty walking,Pain,Decreased activity tolerance,Decreased mobility,Decreased strength,Impaired flexibility,Decreased safety awareness,Increased muscle spasms  Visit Diagnosis: Chronic bilateral low back pain without sciatica  Pain in left hip  Pain in right hip  Other abnormalities of gait and mobility  PHYSICAL THERAPY DISCHARGE SUMMARY  Visits from Start of Care: 3  Current functional level related to  goals / functional outcomes: Patient did not return for follow up   Remaining deficits: Unknown    Education / Equipment: Unknown   Plan: Patient agrees to discharge.  Patient goals were met. Patient is being discharged due to meeting the stated rehab goals.  ?????       Problem List Patient Active Problem List   Diagnosis Date Noted  . Vitamin D deficiency 02/07/2020  . Insulin resistance 02/07/2020  . Class 1 obesity  with serious comorbidity and body mass index (BMI) of 34.0 to 34.9 in adult 02/07/2020  . Myofascial pain 01/12/2020  . Acute post-traumatic headache, not intractable 06/02/2018  . Fibromyalgia 06/05/2016  . Other insomnia 06/05/2016  . Other fatigue 06/05/2016  . DDD (degenerative disc disease), cervical 06/05/2016  . DDD (degenerative disc disease), lumbar 06/05/2016  . Osteopenia of multiple sites 06/05/2016  . History of asthma 06/05/2016  . History of Hashimoto thyroiditis 06/05/2016  . History of anxiety 06/05/2016  . Seasonal affective disorder ([place]) 06/05/2016  . Postmenopausal bleeding 12/02/2012    Class: Present on Admission  . Fibroids, submucosal 12/02/2012    Class: Present on Admission  . Hx of Lyme disease 06/02/2012  . Postural dizziness 08/08/2011  . Hyperparathyroidism, primary ([place]) 01/08/2011    Carney Living PT DPT  02/16/2020, 7:15 PM  [facility] [address] [place], [place], [address] Phone: [phone]   Fax:  [phone]  Name: [name] MRN: [id number] Date of Birth: 1960-10-15

## 2020-02-23 ENCOUNTER — Encounter: Payer: Medicare Other | Admitting: Physical Therapy

## 2020-02-28 ENCOUNTER — Ambulatory Visit: Payer: 59 | Attending: Rheumatology

## 2020-02-29 ENCOUNTER — Ambulatory Visit (INDEPENDENT_AMBULATORY_CARE_PROVIDER_SITE_OTHER): Payer: 59 | Admitting: Family Medicine

## 2020-03-03 ENCOUNTER — Other Ambulatory Visit (INDEPENDENT_AMBULATORY_CARE_PROVIDER_SITE_OTHER): Payer: Self-pay | Admitting: Family Medicine

## 2020-03-03 DIAGNOSIS — E559 Vitamin D deficiency, unspecified: Secondary | ICD-10-CM

## 2020-03-06 NOTE — Telephone Encounter (Signed)
This patient was last seen by Lacie Draft and currently has an upcoming appt scheduled on 03/14/20 with Dr. Jearld Shines

## 2020-03-07 NOTE — Telephone Encounter (Signed)
Rx request 

## 2020-03-08 ENCOUNTER — Other Ambulatory Visit: Payer: Self-pay

## 2020-03-08 ENCOUNTER — Encounter: Payer: Self-pay | Admitting: Physical Medicine and Rehabilitation

## 2020-03-08 ENCOUNTER — Encounter: Payer: 59 | Attending: Physical Medicine and Rehabilitation | Admitting: Physical Medicine and Rehabilitation

## 2020-03-08 VITALS — BP 134/87 | HR 83 | Temp 97.9°F | Ht 64.0 in | Wt 202.2 lb

## 2020-03-08 DIAGNOSIS — M797 Fibromyalgia: Secondary | ICD-10-CM | POA: Diagnosis not present

## 2020-03-08 DIAGNOSIS — G8929 Other chronic pain: Secondary | ICD-10-CM | POA: Insufficient documentation

## 2020-03-08 DIAGNOSIS — M51369 Other intervertebral disc degeneration, lumbar region without mention of lumbar back pain or lower extremity pain: Secondary | ICD-10-CM

## 2020-03-08 DIAGNOSIS — E063 Autoimmune thyroiditis: Secondary | ICD-10-CM | POA: Diagnosis not present

## 2020-03-08 DIAGNOSIS — M7918 Myalgia, other site: Secondary | ICD-10-CM | POA: Diagnosis not present

## 2020-03-08 DIAGNOSIS — R252 Cramp and spasm: Secondary | ICD-10-CM | POA: Insufficient documentation

## 2020-03-08 DIAGNOSIS — J45909 Unspecified asthma, uncomplicated: Secondary | ICD-10-CM | POA: Diagnosis not present

## 2020-03-08 DIAGNOSIS — M545 Low back pain, unspecified: Secondary | ICD-10-CM | POA: Insufficient documentation

## 2020-03-08 DIAGNOSIS — M5136 Other intervertebral disc degeneration, lumbar region: Secondary | ICD-10-CM | POA: Diagnosis not present

## 2020-03-08 DIAGNOSIS — Z8619 Personal history of other infectious and parasitic diseases: Secondary | ICD-10-CM | POA: Diagnosis not present

## 2020-03-08 DIAGNOSIS — Z8639 Personal history of other endocrine, nutritional and metabolic disease: Secondary | ICD-10-CM | POA: Diagnosis not present

## 2020-03-08 MED ORDER — CYCLOBENZAPRINE HCL 10 MG PO TABS: 10.0000 mg | ORAL_TABLET | Freq: Three times a day (TID) | ORAL | 5 refills | Status: DC | PRN

## 2020-03-08 MED ORDER — TRAMADOL HCL 50 MG PO TABS
50.0000 mg | ORAL_TABLET | Freq: Four times a day (QID) | ORAL | 5 refills | Status: DC | PRN
Start: 2020-03-08 — End: 2020-10-02

## 2020-03-08 MED ORDER — GABAPENTIN 300 MG PO CAPS
ORAL_CAPSULE | ORAL | 11 refills | Status: DC
Start: 2020-03-08 — End: 2020-04-10

## 2020-03-08 NOTE — Patient Instructions (Signed)
Pt is a 60 yr old female with hx of asthma, DDD, DJD,  Cat scratch disease, Hashimoto's thyroiditis,Lyme's disease, chronic low back pain , fibromyalgia, and generalized chronic pain here for f/u/trigger point injections   Plan: 1. Flexeril/Cyclobenzaprine- -  10-15 mg- can take 10 mg 3x/day as needed OR 15 mg 2x/day as needed- 4 hours apart between doses.   2. Taking Gabapentin 300 mg nightly for sleep/nerve pain- will continue the dose- 11 refills.   3. Takes naltrexone from other physician- - from a compounding pharmacy-  If you check, can let Dr Tona Sensing know that there is a compounding pharmacy here in [phone] Can order from them, if he wants to- they also mail to pt.    4. TSH needs to be around 1.0 to keep pain at best control.   5. Refill Tramadol 50 mg q6 hours as needed- usually takes 2- 2.5 tabs/day- #120- 5 refills.   6. Patient here for trigger point injections for  Consent done and on chart.  Cleaned areas with alcohol and injected using a 27 gauge 1.5 inch needle  Injected  4.5 cc total Using 1% Lidocaine with no EPI  Upper traps B/L Levators B/L Posterior scalenes B/L Middle scalenes Splenius Capitus Pectoralis Major B/L Rhomboids B/L Infraspinatus Teres Major/minor Thoracic paraspinals Lumbar paraspinals B/L Other injections-    Patient's level of pain prior was 4/10 Current level of pain after injections is 3/10- helped neck and back a lot.   There was no bleeding or complications.  Patient was advised to drink a lot of water on day after injections to flush system Will have increased soreness for 12-48 hours after injections.  Can use Lidocaine patches the day AFTER injections Can use theracane/tennis balls on day of injections in places didn't inject Can use heating pad/ice  4-6 hours AFTER injections

## 2020-03-08 NOTE — Progress Notes (Signed)
Pt is a 60 yr old female with hx of asthma, DDD, DJD,  Cat scratch disease, Hashimoto's thyroiditis,Lyme's disease, chronic low back pain , fibromyalgia, and generalized chronic pain here for evaluation of pain.  Sometimes has severe muscle cramping in R >L leg- like a charlie horse- occurs every other day. As soon as legs exposed to cold, it will occur.   TSH at 3.9- which is in normal range, but I prefer pts to keep  Around 1.0  Exam: Awake, alert, appropriate, tight muscles as per trigger point injection log- had a lot of twitch responses No hoffmans or clonus.   Assessment:  Pt is a 60 yr old female with hx of asthma, DDD, DJD,  Cat scratch disease, Hashimoto's thyroiditis,Lyme's disease, chronic low back pain , fibromyalgia, and generalized chronic pain here for f/u/trigger point injections   Plan: 1. Flexeril/Cyclobenzaprine- -  10-15 mg- can take 10 mg 3x/day as needed OR 15 mg 2x/day as needed- 4 hours apart between doses.   2. Taking Gabapentin 300 mg nightly for sleep/nerve pain- will continue the dose- 11 refills.   3. Takes naltrexone from other physician- - from a compounding pharmacy-  If you check, can let Dr Tona Sensing know that there is a compounding pharmacy here in [phone] Can order from them, if he wants to- they also mail to pt.    4. TSH needs to be around 1.0 to keep pain at best control.   5. Refill Tramadol 50 mg q6 hours as needed- usually takes 2- 2.5 tabs/day- #120- 5 refills.   6. Patient here for trigger point injections for  Consent done and on chart.  Cleaned areas with alcohol and injected using a 27 gauge 1.5 inch needle  Injected  4.5 cc total Using 1% Lidocaine with no EPI  Upper traps B/L Levators B/L Posterior scalenes B/L Middle scalenes Splenius Capitus Pectoralis Major B/L Rhomboids B/L Infraspinatus Teres Major/minor Thoracic paraspinals Lumbar paraspinals B/L Other injections-    Patient's  level of pain prior was 4/10 Current level of pain after injections is 3/10- helped neck and back a lot.   There was no bleeding or complications.  Patient was advised to drink a lot of water on day after injections to flush system Will have increased soreness for 12-48 hours after injections.  Can use Lidocaine patches the day AFTER injections Can use theracane/tennis balls on day of injections in places didn't inject Can use heating pad/ice  4-6 hours AFTER injections  7. F/U- 6-7 weeks- trigger point injections.  I spent a total of 35 minutes on visit- as detailed above.

## 2020-03-11 ENCOUNTER — Encounter (INDEPENDENT_AMBULATORY_CARE_PROVIDER_SITE_OTHER): Payer: Self-pay

## 2020-03-14 ENCOUNTER — Encounter (INDEPENDENT_AMBULATORY_CARE_PROVIDER_SITE_OTHER): Payer: Self-pay | Admitting: Family Medicine

## 2020-03-14 ENCOUNTER — Telehealth (INDEPENDENT_AMBULATORY_CARE_PROVIDER_SITE_OTHER): Payer: Self-pay

## 2020-03-14 ENCOUNTER — Other Ambulatory Visit: Payer: Self-pay

## 2020-03-14 ENCOUNTER — Telehealth (INDEPENDENT_AMBULATORY_CARE_PROVIDER_SITE_OTHER): Payer: 59 | Admitting: Family Medicine

## 2020-03-14 ENCOUNTER — Encounter (INDEPENDENT_AMBULATORY_CARE_PROVIDER_SITE_OTHER): Payer: Self-pay

## 2020-03-14 DIAGNOSIS — Z6834 Body mass index (BMI) 34.0-34.9, adult: Secondary | ICD-10-CM | POA: Diagnosis not present

## 2020-03-14 DIAGNOSIS — R7401 Elevation of levels of liver transaminase levels: Secondary | ICD-10-CM

## 2020-03-14 DIAGNOSIS — E559 Vitamin D deficiency, unspecified: Secondary | ICD-10-CM | POA: Diagnosis not present

## 2020-03-14 DIAGNOSIS — E669 Obesity, unspecified: Secondary | ICD-10-CM

## 2020-03-14 MED ORDER — VITAMIN D (ERGOCALCIFEROL) 1.25 MG (50000 UNIT) PO CAPS: 50000.0000 [IU] | ORAL_CAPSULE | ORAL | 0 refills | Status: DC

## 2020-03-14 NOTE — Telephone Encounter (Signed)
I connected with  [name] on 03/14/20 by a video enabled telemedicine application and verified that I am speaking with the correct person using two identifiers.   I discussed the limitations of evaluation and management by telemedicine. The patient expressed understanding and agreed to proceed.

## 2020-03-15 NOTE — Progress Notes (Signed)
TeleHealth Visit:  Due to the COVID-19 pandemic, this visit was completed with telemedicine (audio/video) technology to reduce patient and provider exposure as well as to preserve personal protective equipment.   [name] has verbally consented to this TeleHealth visit. The patient is located at home, the provider is located at the [facility] office. The participants in this visit include the listed provider and patient. The visit was conducted today via MyChart video.   Chief Complaint: OBESITY [name] is here to discuss her progress with her obesity treatment plan along with follow-up of her obesity related diagnoses. [name] is on the Category 3 Plan and states she is following her eating plan approximately 80% of the time. [name] states she walked 1 mile.   Today's visit was #: 4 Starting weight: 201 lbs Starting date: 01/03/2020  Interim History: [name] reports increase in weight and a weight of 202 lbs today. She got up to 210 lbs over the holidays and she has lost down to 202 lbs since that. She does realize that she is an emotional eater and she finds herself gravitating to increase in snacking. She is not having any issues going to the grocery store even with the weather.  Subjective:   1. Vitamin D deficiency [name] denies nausea, vomiting, or muscle weakness, but she notes fatigue. She is on prescription Vit D.  2. Transaminitis [name]'s last LFTs were slightly elevated in early November.  Assessment/Plan:   1. Vitamin D deficiency Low Vitamin D level contributes to fatigue and are associated with obesity, breast, and colon cancer. We will refill prescription Vitamin D for 1 month. Lakeitha will follow-up for routine testing of Vitamin D, at least 2-3 times per year to avoid over-replacement.  -Vitamin D, Ergocalciferol, (DRISDOL) 1.25 MG (50000 UNIT) CAPS capsule #4 Refill: 0  2. Transaminitis We will repeat labs in early March. Annaly will continue to  follow up as directed.  3. Class 1 obesity with serious comorbidity and body mass index (BMI) of 34.0 to 34.9 in adult, unspecified obesity type [name] is currently in the action stage of change. As such, her goal is to continue with weight loss efforts. She has agreed to the Category 3 Plan.   Exercise goals: No exercise has been prescribed at this time.  Behavioral modification strategies: increasing lean protein intake, meal planning and cooking strategies, keeping healthy foods in the home and planning for success.  [name] has agreed to follow-up with our clinic in 3 weeks. She was informed of the importance of frequent follow-up visits to maximize her success with intensive lifestyle modifications for her multiple health conditions.  Objective:   VITALS: Per patient if applicable, see vitals. GENERAL: Alert and in no acute distress. CARDIOPULMONARY: No increased WOB. Speaking in clear sentences.  PSYCH: Pleasant and cooperative. Speech normal rate and rhythm. Affect is appropriate. Insight and judgement are appropriate. Attention is focused, linear, and appropriate.  NEURO: Oriented as arrived to appointment on time with no prompting.   Lab Results  Component Value Date   CREATININE 0.91 01/03/2020   BUN 10 01/03/2020   NA 140 01/03/2020   K 4.1 01/03/2020   CL 102 01/03/2020   CO2 25 01/03/2020   Lab Results  Component Value Date   ALT 69 (H) 01/03/2020   AST 44 (H) 01/03/2020   ALKPHOS 79 01/03/2020   BILITOT 0.4 01/03/2020   Lab Results  Component Value Date   HGBA1C 5.1 01/03/2020   Lab Results  Component Value Date   INSULIN 11.6 01/03/2020   Lab Results  Component Value Date   TSH 3.920 01/03/2020   Lab Results  Component Value Date   CHOL 198 01/03/2020   HDL 52 01/03/2020   LDLCALC 126 (H) 01/03/2020   TRIG 114 01/03/2020   Lab Results  Component Value Date   WBC 5.1 01/03/2020   HGB 15.1 01/03/2020   HCT 43.8 01/03/2020   MCV 89 01/03/2020   PLT  197 01/03/2020   No results found for: IRON, TIBC, FERRITIN  Attestation Statements:   Reviewed by clinician on day of visit: allergies, medications, problem list, medical history, surgical history, family history, social history, and previous encounter notes.   I, Trixie Dredge, am acting as transcriptionist for Coralie Common, MD.  I have reviewed the above documentation for accuracy and completeness, and I agree with the above. - Jinny Blossom, MD

## 2020-03-15 NOTE — Progress Notes (Deleted)
TeleHealth Visit:  Due to the COVID-19 pandemic, this visit was completed with telemedicine (audio/video) technology to reduce patient and provider exposure as well as to preserve personal protective equipment.   [name] has verbally consented to this TeleHealth visit. The patient is located at home, the provider is located at the [facility] office. The participants in this visit include the listed provider and patient and. The visit was conducted today via Mychart Video.   Chief Complaint: OBESITY [name] is here to discuss her progress with her obesity treatment plan along with follow-up of her obesity related diagnoses. [name] is on the Category 3 Plan and states she is following her eating plan approximately 80% of the time. [name] states she is walking 1 mile.  Today's visit was #: 4 Starting weight: 201 lbs Starting date: 01/03/2020  Interim History: [name]'s weight is 202 today. She reports increase in weight and she got up to 210 lbs over the holidays. 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 She is not having any issues going to grocery store even with weather.  Subjective:   Vitamin D deficiency  [name]'s last Vitamin D was 35.0. She denies nausea, vomiting, or muscle weakness, and notes fatigue. Transaminitis Last LFT's slightly elevated in early November.   Assessment/Plan:   Vitamin D deficiency   Low Vitamin D level contributes to fatigue and are associated with obesity, breast, and colon cancer. She agrees to continue to take prescription Vitamin D @50 ,000 IU every week and will follow-up for routine testing of Vitamin D, at least 2-3 times per year to avoid over-replacement. Will refill Vitamin D 50,000 unit weekly #4.   - Plan: Vitamin D, Ergocalciferol, (DRISDOL) 1.25 MG (50000 UNIT) CAPS capsule Transaminitis [name] will repeat labs in early March.  Class 1 obesity with serious  comorbidity and body mass index (BMI) of 34.0 to 34.9 in adult, unspecified obesity type  [name] is currently in the action stage of change. As such, her goal is to continue with weight loss efforts. She has agreed to the Category 3 Plan.   Exercise goals: No exercise has been prescribed at this time.  Behavioral modification strategies: increasing lean protein intake, meal planning and cooking strategies, keeping healthy foods in the home and planning for success.  [name] has agreed to follow-up with our clinic in 3 weeks. She was informed of the importance of frequent follow-up visits to maximize her success with intensive lifestyle modifications for her multiple health conditions.    Objective:   VITALS: Per patient if applicable, see vitals. GENERAL: Alert and in no acute distress. CARDIOPULMONARY: No increased WOB. Speaking in clear sentences.  PSYCH: Pleasant and cooperative. Speech normal rate and rhythm. Affect is appropriate. Insight and judgement are appropriate. Attention is focused, linear, and appropriate.  NEURO: Oriented as arrived to appointment on time with no prompting.   Lab Results  Component Value Date   CREATININE 0.91 01/03/2020   BUN 10 01/03/2020   NA 140 01/03/2020   K 4.1 01/03/2020   CL 102 01/03/2020   CO2 25 01/03/2020   Lab Results  Component Value Date   ALT 69 (H) 01/03/2020   AST 44 (H) 01/03/2020   ALKPHOS 79 01/03/2020   BILITOT 0.4 01/03/2020   Lab Results  Component Value Date   HGBA1C 5.1 01/03/2020   Lab Results  Component Value Date   INSULIN 11.6 01/03/2020   Lab Results  Component Value Date   TSH 3.920 01/03/2020   Lab Results  Component Value Date   CHOL 198 01/03/2020   HDL 52 01/03/2020   LDLCALC 126 (H) 01/03/2020   TRIG 114 01/03/2020   Lab Results  Component Value Date   WBC 5.1 01/03/2020   HGB 15.1 01/03/2020   HCT 43.8 01/03/2020   MCV 89 01/03/2020   PLT 197 01/03/2020   No results found for: IRON,  TIBC, FERRITIN  Attestation Statements:   Reviewed by clinician on day of visit: allergies, medications, problem list, medical history, surgical history, family history, social history, and previous encounter notes.    I, Para March, am acting as transcriptionist for Coralie Common, MD.  I have reviewed the above documentation for accuracy and completeness, and I agree with the above. - Jinny Blossom, MD

## 2020-03-28 NOTE — Progress Notes (Deleted)
  TeleHealth Visit:  Due to the COVID-19 pandemic, this visit was completed with telemedicine (audio/video) technology to reduce patient and provider exposure as well as to preserve personal protective equipment.   [name] has verbally consented to this TeleHealth visit. The patient is located at home, the provider is located at the [facility] office. The participants in this visit include the listed provider and patient and. The visit was conducted today via Mychart Video.   Chief Complaint: OBESITY [name] is here to discuss her progress with her obesity treatment plan along with follow-up of her obesity related diagnoses. [name] is on the Category 3 Plan and states she is following her eating plan approximately 80% of the time. [name] states she is walking 1 mile.  Today's visit was #: 4 Starting weight: 201 lbs Starting date: 01/03/2020  Interim History: [name]'s weight is 202 today. She reports increase in weight and she got up to 210 lbs over the holidays. [name] does realize she is an emotional eater and finds herself gravitating to increase in snacking. She is not having any issues going to grocery store even with weather.  Subjective:   Vitamin D deficiency  [name]'s last Vitamin D was 35.0. She denies nausea, vomiting, or muscle weakness, and notes fatigue. Transaminitis Last LFT's slightly elevated in early November.   Assessment/Plan:   Vitamin D deficiency   Low Vitamin D level contributes to fatigue and are associated with obesity, breast, and colon cancer. She agrees to continue to take prescription Vitamin D @50,000 IU every week and will follow-up for routine testing of Vitamin D, at least 2-3 times per year to avoid over-replacement. Will refill Vitamin D 50,000 unit weekly #4.   - Plan: Vitamin D, Ergocalciferol, (DRISDOL) 1.25 MG (50000 UNIT) CAPS capsule Transaminitis [name] will repeat labs in early March.  Class 1 obesity with serious  comorbidity and body mass index (BMI) of 34.0 to 34.9 in adult, unspecified obesity type  [name] is currently in the action stage of change. As such, her goal is to continue with weight loss efforts. She has agreed to the Category 3 Plan.   Exercise goals: No exercise has been prescribed at this time.  Behavioral modification strategies: increasing lean protein intake, meal planning and cooking strategies, keeping healthy foods in the home and planning for success.  [name] has agreed to follow-up with our clinic in 3 weeks. She was informed of the importance of frequent follow-up visits to maximize her success with intensive lifestyle modifications for her multiple health conditions.    Objective:   VITALS: Per patient if applicable, see vitals. GENERAL: Alert and in no acute distress. CARDIOPULMONARY: No increased WOB. Speaking in clear sentences.  PSYCH: Pleasant and cooperative. Speech normal rate and rhythm. Affect is appropriate. Insight and judgement are appropriate. Attention is focused, linear, and appropriate.  NEURO: Oriented as arrived to appointment on time with no prompting.   Lab Results  Component Value Date   CREATININE 0.91 01/03/2020   BUN 10 01/03/2020   NA 140 01/03/2020   K 4.1 01/03/2020   CL 102 01/03/2020   CO2 25 01/03/2020   Lab Results  Component Value Date   ALT 69 (H) 01/03/2020   AST 44 (H) 01/03/2020   ALKPHOS 79 01/03/2020   BILITOT 0.4 01/03/2020   Lab Results  Component Value Date   HGBA1C 5.1 01/03/2020   Lab Results  Component Value Date   INSULIN 11.6 01/03/2020   Lab Results    Component Value Date   TSH 3.920 01/03/2020   Lab Results  Component Value Date   CHOL 198 01/03/2020   HDL 52 01/03/2020   LDLCALC 126 (H) 01/03/2020   TRIG 114 01/03/2020   Lab Results  Component Value Date   WBC 5.1 01/03/2020   HGB 15.1 01/03/2020   HCT 43.8 01/03/2020   MCV 89 01/03/2020   PLT 197 01/03/2020   No results found for: IRON,  TIBC, FERRITIN  Attestation Statements:   Reviewed by clinician on day of visit: allergies, medications, problem list, medical history, surgical history, family history, social history, and previous encounter notes.    I, Para March, am acting as transcriptionist for Coralie Common, MD.  I have reviewed the above documentation for accuracy and completeness, and I agree with the above. - Jinny Blossom, MD

## 2020-04-03 ENCOUNTER — Ambulatory Visit: Payer: 59 | Admitting: Physical Therapy

## 2020-04-04 ENCOUNTER — Other Ambulatory Visit: Payer: Self-pay

## 2020-04-04 ENCOUNTER — Encounter (INDEPENDENT_AMBULATORY_CARE_PROVIDER_SITE_OTHER): Payer: Self-pay | Admitting: Family Medicine

## 2020-04-04 ENCOUNTER — Ambulatory Visit (INDEPENDENT_AMBULATORY_CARE_PROVIDER_SITE_OTHER): Payer: 59 | Admitting: Family Medicine

## 2020-04-04 VITALS — BP 142/88 | HR 87 | Temp 97.6°F | Ht 64.0 in | Wt 199.0 lb

## 2020-04-04 DIAGNOSIS — Z6834 Body mass index (BMI) 34.0-34.9, adult: Secondary | ICD-10-CM | POA: Diagnosis not present

## 2020-04-04 DIAGNOSIS — Z9189 Other specified personal risk factors, not elsewhere classified: Secondary | ICD-10-CM

## 2020-04-04 DIAGNOSIS — E8881 Metabolic syndrome: Secondary | ICD-10-CM | POA: Diagnosis not present

## 2020-04-04 DIAGNOSIS — E559 Vitamin D deficiency, unspecified: Secondary | ICD-10-CM | POA: Diagnosis not present

## 2020-04-04 DIAGNOSIS — E669 Obesity, unspecified: Secondary | ICD-10-CM

## 2020-04-04 MED ORDER — VITAMIN D (ERGOCALCIFEROL) 1.25 MG (50000 UNIT) PO CAPS: 50000.0000 [IU] | ORAL_CAPSULE | ORAL | 0 refills | Status: DC

## 2020-04-05 NOTE — Progress Notes (Signed)
Chief Complaint:   OBESITY [name] is here to discuss her progress with her obesity treatment plan along with follow-up of her obesity related diagnoses. [name] is on the Category 3 Plan and states she is following her eating plan approximately 50% of the time. [name] states she is doing 0 minutes 0 times per week.  Today's visit was #: 5 Starting weight: 201 lbs Starting date: 01/03/2020 Today's weight: 199 lbs Today's date: 04/04/2020 Total lbs lost to date: 2 lb Total lbs lost since last in-office visit: 0  Interim History: Pt voices her lyme disease doctor is upset the she is not on a lower carbohydrates plan. Pt voices she will miss fruits and sweets. Pt is feeling stressed with some home demands. Pt previously did very well on Atkins and this helped control her Lyme disease.   Subjective:   1. Vitamin D deficiency Pt denies nausea, vomiting, and muscle weakness but notes fatigue. Pt is on prescription Vit D.  2. Insulin resistance Last A1c 5.1 and insulin level 11.6. Pt is not on medication.  Lab Results  Component Value Date   INSULIN 11.6 01/03/2020   Lab Results  Component Value Date   HGBA1C 5.1 01/03/2020    3. At risk for osteoporosis [name] is at higher risk of osteopenia and osteoporosis due to Vitamin D deficiency.   Assessment/Plan:   1. Vitamin D deficiency Low Vitamin D level contributes to fatigue and are associated with obesity, breast, and colon cancer. She agrees to continue to take prescription Vitamin D @50 ,000 IU every week and will follow-up for routine testing of Vitamin D, at least 2-3 times per year to avoid over-replacement.  - Vitamin D, Ergocalciferol, (DRISDOL) 1.25 MG (50000 UNIT) CAPS capsule; Take 1 capsule (50,000 Units total) by mouth every 7 (seven) days.  Dispense: 4 capsule; Refill: 0  2. Insulin resistance [name] will continue to work on weight loss, exercise, and decreasing simple carbohydrates to help decrease the risk of diabetes.  [name] agreed to follow-up with [name] as directed to closely monitor her progress.  3. At risk for osteoporosis [name] was given approximately 15 minutes of osteoporosis prevention counseling today. [name] is at risk for osteopenia and osteoporosis due to her Vitamin D deficiency. She was encouraged to take her Vitamin D and follow her higher calcium diet and increase strengthening exercise to help strengthen her bones and decrease her risk of osteopenia and osteoporosis.  Repetitive spaced learning was employed today to elicit superior memory formation and behavioral change.  4. Class 1 obesity with serious comorbidity and body mass index (BMI) of 34.0 to 34.9 in adult, unspecified obesity type [name] is currently in the action stage of change. As such, her goal is to continue with weight loss efforts. She has agreed to following a lower carbohydrate, vegetable and lean protein rich diet plan.   Exercise goals: No exercise has been prescribed at this time.  Behavioral modification strategies: increasing lean protein intake, meal planning and cooking strategies, keeping healthy foods in the home and planning for success.  [name] has agreed to follow-up with our clinic in 2-3 weeks. She was informed of the importance of frequent follow-up visits to maximize her success with intensive lifestyle modifications for her multiple health conditions.   Objective:   Blood pressure (!) 142/88, pulse 87, temperature 97.6 F (36.4 C), temperature source Oral, height 5\' 4"  (1.626 m), weight 199 lb (90.3 kg), last menstrual period 01/22/2011, SpO2 96 %. Body mass index is  34.16 kg/m.  General: Cooperative, alert, well developed, in no acute distress. HEENT: Conjunctivae and lids unremarkable. Cardiovascular: Regular rhythm.  Lungs: Normal work of breathing. Neurologic: No focal deficits.   Lab Results  Component Value Date   CREATININE 0.91 01/03/2020   BUN 10 01/03/2020   NA 140 01/03/2020   K 4.1  01/03/2020   CL 102 01/03/2020   CO2 25 01/03/2020   Lab Results  Component Value Date   ALT 69 (H) 01/03/2020   AST 44 (H) 01/03/2020   ALKPHOS 79 01/03/2020   BILITOT 0.4 01/03/2020   Lab Results  Component Value Date   HGBA1C 5.1 01/03/2020   Lab Results  Component Value Date   INSULIN 11.6 01/03/2020   Lab Results  Component Value Date   TSH 3.920 01/03/2020   Lab Results  Component Value Date   CHOL 198 01/03/2020   HDL 52 01/03/2020   LDLCALC 126 (H) 01/03/2020   TRIG 114 01/03/2020   Lab Results  Component Value Date   WBC 5.1 01/03/2020   HGB 15.1 01/03/2020   HCT 43.8 01/03/2020   MCV 89 01/03/2020   PLT 197 01/03/2020   No results found for: IRON, TIBC, FERRITIN  Attestation Statements:   Reviewed by clinician on day of visit: allergies, medications, problem list, medical history, surgical history, family history, social history, and previous encounter notes.  Coral Ceo, am acting as transcriptionist for Coralie Common, MD.  I have reviewed the above documentation for accuracy and completeness, and I agree with the above. - Jinny Blossom, MD

## 2020-04-08 ENCOUNTER — Encounter: Payer: Self-pay | Admitting: Physical Therapy

## 2020-04-10 ENCOUNTER — Ambulatory Visit: Payer: 59 | Admitting: Physical Therapy

## 2020-04-10 ENCOUNTER — Telehealth: Payer: Self-pay

## 2020-04-10 MED ORDER — GABAPENTIN 300 MG PO CAPS
ORAL_CAPSULE | ORAL | 11 refills | Status: DC
Start: 2020-04-10 — End: 2020-10-02

## 2020-04-10 NOTE — Telephone Encounter (Signed)
[name] is out of Gabapentin 300 MG. Per patient's message it helps her to sleep. And her new Rx can not be released until March.  Please advise.  Call back ph [phone]

## 2020-04-10 NOTE — Telephone Encounter (Signed)
Will change Rx to 300 mg 1-2x/day- basically can take 1 pill nightly, but if needs more, can take 2 tabs- 11 Refills

## 2020-04-17 ENCOUNTER — Encounter: Payer: Self-pay | Admitting: Physical Medicine and Rehabilitation

## 2020-04-17 ENCOUNTER — Encounter: Payer: 59 | Attending: Physical Medicine and Rehabilitation | Admitting: Physical Medicine and Rehabilitation

## 2020-04-17 ENCOUNTER — Other Ambulatory Visit: Payer: Self-pay

## 2020-04-17 ENCOUNTER — Other Ambulatory Visit: Payer: Self-pay | Admitting: Internal Medicine

## 2020-04-17 VITALS — BP 140/79 | HR 79 | Temp 97.6°F | Ht 64.0 in | Wt 204.4 lb

## 2020-04-17 DIAGNOSIS — Z8639 Personal history of other endocrine, nutritional and metabolic disease: Secondary | ICD-10-CM | POA: Diagnosis not present

## 2020-04-17 DIAGNOSIS — E559 Vitamin D deficiency, unspecified: Secondary | ICD-10-CM

## 2020-04-17 DIAGNOSIS — M7918 Myalgia, other site: Secondary | ICD-10-CM

## 2020-04-17 DIAGNOSIS — M797 Fibromyalgia: Secondary | ICD-10-CM | POA: Diagnosis not present

## 2020-04-17 DIAGNOSIS — K760 Fatty (change of) liver, not elsewhere classified: Secondary | ICD-10-CM

## 2020-04-17 NOTE — Progress Notes (Signed)
Pt is a 60 yr old female with hx of asthma, DDD, DJD, Cat scratch disease, Hashimoto's thyroiditis,Lyme's disease, chronic low back pain , fibromyalgia, and generalized chronic pain here for f/u of pain.  PCP took note of thyroid level   Dr Minna Antis in [place] wants to see her thyroid levels. He's concerned about her adrenal levels - thought they were "failing".   On [place] thyroid- lowered to 30 mg from 60 mg- in early 2021.   Takes adrenal supplement in AM-  Dragging in the AM.  Vit D level 35- when last checked.  Takes supplements- but it out right now.   Trigger points are helpful-  At least 1 week, feels better.  Not using the theracane- hasn't bought it.      Plan: 1. Can ask about taking a higher dose of Vitamin D. Goal of Vit D is 30-100- you are at 35.  2.  Strongly suggest- Theracane- to maintain relaxation of muscles for a longer period- in between-  3. Hasn't gotten refill of Naltrexone-  Been off 6 months.  Will start at 3 mg daily x 1 week then 60 mg daily to finish out month- also calledin 6 mg daily  For 6 month supply after the first month.   4. Has refills for tramadol and Gabapentin.   5. Patient here for trigger point injections for  Consent done and on chart.  Cleaned areas with alcohol and injected using a 27 gauge 1.5 inch needle  Injected  6cc Using 1% Lidocaine with no EPI  Upper traps B/L Levators B/L Posterior scalenes Middle scalenes Splenius Capitus Pectoralis Major Rhomboids B/L x2 Infraspinatus Teres Major/minor Thoracic paraspinals B/L Lumbar paraspinals B/L Other injections- B/L deltoids, B/L anterior tibialis, and B/L mid posterior thighs/hamstrings.     Patient's level of pain prior was 7/10 Current level of pain after injections is 6/10  There was no bleeding or complications.  Patient was advised to drink a lot of water on day after injections to flush system Will have increased soreness for 12-48 hours after injections.  Can  use Lidocaine patches the day AFTER injections Can use theracane on day of injections in places didn't inject Can use heating pad 4-6 hours AFTER injections   6. F/U in 6-8 weeks  I spent a total of 35 minutes on visit- as detailed above.

## 2020-04-17 NOTE — Patient Instructions (Addendum)
Plan: 1. Can ask about taking a higher dose of Vitamin D. Goal of Vit D is 30-100- you are at 35.  2.  Strongly suggest- Theracane- to maintain relaxation of muscles for a longer period- in between-  3. Hasn't gotten refill of Naltrexone-  Been off 6 months.  Will start at 3 mg daily x 1 week then 60 mg daily to finish out month- also calledin 6 mg daily  For 6 month supply after the first month.   4. Has refills for tramadol and Gabapentin.   5. Patient here for trigger point injections for  Consent done and on chart.  Cleaned areas with alcohol and injected using a 27 gauge 1.5 inch needle  Injected  6cc Using 1% Lidocaine with no EPI  Upper traps B/L Levators B/L Posterior scalenes Middle scalenes Splenius Capitus Pectoralis Major Rhomboids B/L x2 Infraspinatus Teres Major/minor Thoracic paraspinals B/L Lumbar paraspinals B/L Other injections- B/L deltoids, B/L anterior tibialis, and B/L mid posterior thighs/hamstrings.     Patient's level of pain prior was 7/10 Current level of pain after injections is 6/10  There was no bleeding or complications.  Patient was advised to drink a lot of water on day after injections to flush system Will have increased soreness for 12-48 hours after injections.  Can use Lidocaine patches the day AFTER injections Can use theracane on day of injections in places didn't inject Can use heating pad 4-6 hours AFTER injections   6. F/U in 6-8 weeks- get tennis ball- throw in car- use on buttocks and posterior thighs when driving.

## 2020-04-18 ENCOUNTER — Other Ambulatory Visit: Payer: Self-pay

## 2020-04-18 ENCOUNTER — Ambulatory Visit (INDEPENDENT_AMBULATORY_CARE_PROVIDER_SITE_OTHER): Payer: 59 | Admitting: Family Medicine

## 2020-04-18 ENCOUNTER — Encounter (INDEPENDENT_AMBULATORY_CARE_PROVIDER_SITE_OTHER): Payer: Self-pay | Admitting: Family Medicine

## 2020-04-18 VITALS — BP 138/83 | HR 90 | Temp 98.1°F | Ht 64.0 in | Wt 198.0 lb

## 2020-04-18 DIAGNOSIS — E669 Obesity, unspecified: Secondary | ICD-10-CM | POA: Diagnosis not present

## 2020-04-18 DIAGNOSIS — R7401 Elevation of levels of liver transaminase levels: Secondary | ICD-10-CM | POA: Diagnosis not present

## 2020-04-18 DIAGNOSIS — E559 Vitamin D deficiency, unspecified: Secondary | ICD-10-CM

## 2020-04-18 DIAGNOSIS — Z6834 Body mass index (BMI) 34.0-34.9, adult: Secondary | ICD-10-CM

## 2020-04-19 ENCOUNTER — Emergency Department (HOSPITAL_COMMUNITY): Payer: 59

## 2020-04-19 ENCOUNTER — Encounter (HOSPITAL_COMMUNITY): Payer: Self-pay

## 2020-04-19 ENCOUNTER — Emergency Department (HOSPITAL_COMMUNITY)
Admission: EM | Admit: 2020-04-19 | Discharge: 2020-04-19 | Disposition: A | Discharge status: Home / Self Care | Payer: 59 | Attending: Emergency Medicine | Admitting: Emergency Medicine

## 2020-04-19 ENCOUNTER — Other Ambulatory Visit: Payer: Self-pay

## 2020-04-19 DIAGNOSIS — E039 Hypothyroidism, unspecified: Secondary | ICD-10-CM | POA: Diagnosis not present

## 2020-04-19 DIAGNOSIS — R079 Chest pain, unspecified: Secondary | ICD-10-CM | POA: Diagnosis not present

## 2020-04-19 DIAGNOSIS — R072 Precordial pain: Secondary | ICD-10-CM | POA: Diagnosis not present

## 2020-04-19 DIAGNOSIS — Z79899 Other long term (current) drug therapy: Secondary | ICD-10-CM | POA: Diagnosis not present

## 2020-04-19 DIAGNOSIS — J45909 Unspecified asthma, uncomplicated: Secondary | ICD-10-CM | POA: Diagnosis not present

## 2020-04-19 LAB — CBC WITH DIFFERENTIAL/PLATELET
Abs Immature Granulocytes: 0.02 10*3/uL (ref 0.00–0.07)
Basophils Absolute: 0 10*3/uL (ref 0.0–0.1)
Basophils Relative: 1 %
Eosinophils Absolute: 0.1 10*3/uL (ref 0.0–0.5)
Eosinophils Relative: 2 %
HCT: 42.5 % (ref 36.0–46.0)
Hemoglobin: 14.8 g/dL (ref 12.0–15.0)
Immature Granulocytes: 0 %
Lymphocytes Relative: 28 %
Lymphs Abs: 1.6 10*3/uL (ref 0.7–4.0)
MCH: 30.8 pg (ref 26.0–34.0)
MCHC: 34.8 g/dL (ref 30.0–36.0)
MCV: 88.5 fL (ref 80.0–100.0)
Monocytes Absolute: 0.5 10*3/uL (ref 0.1–1.0)
Monocytes Relative: 8 %
Neutro Abs: 3.6 10*3/uL (ref 1.7–7.7)
Neutrophils Relative %: 61 %
Platelets: 207 10*3/uL (ref 150–400)
RBC: 4.8 MIL/uL (ref 3.87–5.11)
RDW: 12.3 % (ref 11.5–15.5)
WBC: 5.9 10*3/uL (ref 4.0–10.5)
nRBC: 0 % (ref 0.0–0.2)

## 2020-04-19 LAB — COMPREHENSIVE METABOLIC PANEL
ALT: 56 U/L — ABNORMAL HIGH (ref 0–44)
AST: 37 U/L (ref 15–41)
Albumin: 4.6 g/dL (ref 3.5–5.0)
Alkaline Phosphatase: 61 U/L (ref 38–126)
Anion gap: 12 (ref 5–15)
BUN: 19 mg/dL (ref 6–20)
CO2: 24 mmol/L (ref 22–32)
Calcium: 9.7 mg/dL (ref 8.9–10.3)
Chloride: 104 mmol/L (ref 98–111)
Creatinine, Ser: 1.04 mg/dL — ABNORMAL HIGH (ref 0.44–1.00)
GFR, Estimated: >60 mL/min (ref >60)
Glucose, Bld: 89 mg/dL (ref 70–99)
Potassium: 3.7 mmol/L (ref 3.5–5.1)
Sodium: 140 mmol/L (ref 135–145)
Total Bilirubin: 0.7 mg/dL (ref 0.3–1.2)
Total Protein: 7.9 g/dL (ref 6.5–8.1)

## 2020-04-19 LAB — TROPONIN I (HIGH SENSITIVITY)
Troponin I (High Sensitivity): <2 ng/L (ref <18)
Troponin I (High Sensitivity): 2 ng/L (ref <18)

## 2020-04-19 LAB — I-STAT BETA HCG BLOOD, ED (MC, WL, AP ONLY): I-stat hCG, quantitative: 7.6 m[IU]/mL — ABNORMAL HIGH (ref <5)

## 2020-04-19 MED ORDER — ACETAMINOPHEN 500 MG PO TABS
1000.0000 mg | ORAL_TABLET | Freq: Once | ORAL | Status: AC
  Administered 2020-04-19: 1000 mg via ORAL
  Filled 2020-04-19: qty 2

## 2020-04-19 NOTE — ED Triage Notes (Signed)
Pt presents with c/o chest pain on the left side for approx one week. Pt reports the pain has been intermittent in nature.

## 2020-04-19 NOTE — Discharge Instructions (Signed)
You came to the emergency department today to be evaluated for your chest pain.  Your EKG, troponin, and other lab work were reassuring without any heart attack today.  It is likely that your chest pain is related to your chronic musculoskeletal pain from your Lyme's disease.  Please follow-up with your primary care provider and physician who manages your Lyme's disease.  Get help right away if: Your chest pain gets worse. You have a cough that gets worse, or you cough up blood. You have severe pain in your abdomen. You faint. You have sudden, unexplained chest discomfort. You have sudden, unexplained discomfort in your arms, back, neck, or jaw. You have shortness of breath at any time. You suddenly start to sweat, or your skin gets clammy. You feel nausea or you vomit. You suddenly feel lightheaded or dizzy. You have severe weakness, or unexplained weakness or fatigue. Your heart begins to beat quickly, or it feels like it is skipping beats.

## 2020-04-19 NOTE — ED Provider Notes (Signed)
[facility] DEPT Provider Note   CSN: [id number] Arrival date & time: 04/19/20  1328     History Chief Complaint  Patient presents with  . Chest Pain    [name] is a 60 y.o. female history of Lyme's disease, fibromyalgia, hyperparathyroidism.  Patient reports that she has had a history of musculoskeletal chest pain due to her Lyme's disease over the last 30 years.    Patient presents with chief complaint of chest pain.  Patient reports that she has had an increase in her chest pain over the past week.  Patient reports the pain is located underneath her sternum, does not radiate.  Patient describes the pain as pressure, rates it as a 6/10.  No alleviating or aggravating factors.  Pain does not change with exertion.  Patient reports he had pain this morning associated with nausea and feeling lightheaded.  Her nausea and lightheadedness resolved over the course of an hour while sitting down.  She denies any shortness of breath, cough, hemoptysis, leg swelling, palpitations, abdominal pain, vomiting, syncopal episodes, orthopnea, PND.  No surgery in the last 4 weeks, cancer treatment, unilateral leg swelling or tenderness, hormone therapy, prolonged immobilization, history of DVT or PE.  HPI     Past Medical History:  Diagnosis Date  . Abnormal heart rate   . Abnormal uterine bleeding (AUB)   . Allergic rhinitis   . Anemia   . Anxiety   . Asthma   . B12 deficiency   . Bruises easily   . Cat-scratch disease    h/o  . Chest pain   . Chronic back pain    oa  . Chronic fatigue syndrome   . CMV (cytomegalovirus) ([place])    h/o  . Constipation   . Degenerative disc disease, cervical    and lumbar  . Depression   . Fibrocystic breast disease   . Fibroids   . Fibromyalgia   . GERD (gastroesophageal reflux disease)    watches diet  . H/O hiatal hernia   . H/O myomectomy   . Hashimoto thyroiditis   . History of babesiosis    PARASITITIS  RESOLVED  . History of Bell's palsy    2000--  resolved  . History of concussion    age 60--  no residual  . History of hyperparathyroidism    PRIMARY--  S/P PARATHYROIDECTOMY 01-28-2011  . History of palpitations    stress induced  . History of stomach ulcers   . History of uterine fibroid   . Hypoglycemia   . Hypothyroidism   . Joint pain   . Kidney disease    3A  . Lyme disease    CHRONIC --  FOR 22 YRS (SINCE APPROX 1996)  AND HX CAT SCRATCH DISEASE WHICH HAS RESOLVED  . Musculoskeletal chest pain    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  . Osteoarthritis    all joints secondary to lyme disease/  back is worse  . Palpitations   . SOB (shortness of breath)   . Spinal stenosis   . Swallowing difficulty   . Vitamin B12 deficiency   . Vitamin D deficiency   . Wears contact lenses     Patient Active Problem List   Diagnosis Date Noted  . Vitamin D deficiency 02/07/2020  . Insulin resistance 02/07/2020  . Class 1 obesity with serious comorbidity and body mass index (BMI) of 34.0 to 34.9 in adult 02/07/2020  . Myofascial pain 01/12/2020  . Acute post-traumatic headache, not  intractable 06/02/2018  . Fibromyalgia 06/05/2016  . Other insomnia 06/05/2016  . Other fatigue 06/05/2016  . DDD (degenerative disc disease), cervical 06/05/2016  . DDD (degenerative disc disease), lumbar 06/05/2016  . Osteopenia of multiple sites 06/05/2016  . History of asthma 06/05/2016  . History of Hashimoto thyroiditis 06/05/2016  . History of anxiety 06/05/2016  . Seasonal affective disorder ([place]) 06/05/2016  . Postmenopausal bleeding 12/02/2012    Class: Present on Admission  . Fibroids, submucosal 12/02/2012    Class: Present on Admission  . Hx of Lyme disease 06/02/2012  . Postural dizziness 08/08/2011  . Hyperparathyroidism, primary ([place]) 01/08/2011    Past Surgical History:  Procedure Laterality Date  . COLONOSCOPY  12/2017  . DILATATION & CURRETTAGE/HYSTEROSCOPY WITH RESECTOCOPE N/A  12/02/2012   Procedure: DILATATION & CURETTAGE/HYSTEROSCOPY WITH RESECTOCOPE;  Surgeon: Darlyn Chamber, MD;  Location: [facility];  Service: Gynecology;  Laterality: N/A;  . GANGLION CYST EXCISION  2007   HAND  . HERNIA REPAIR  2010   Exploratory  . HYSTEROSCOPIC MYOMECTOMY AND ABDOMINAL MYOMECTOMY  2006  . PARATHYROIDECTOMY  01/28/2011   Procedure: PARATHYROIDECTOMY;  Surgeon: Earnstine Regal, MD;  Location: [facility];  Service: General;  Laterality: N/A;   . STRESS ECHO REPORT  09-01-2009  DR [place]   NORMAL LVF/  EF 60%/  NORMAL HYPERDYNAMIC RESPONSE  . TILT TABLE STUDY  08-21-2011    DR KLEIN   BP WERE CONSISTENT 110-117 THROUGH OUT STUDY/ BASELINE HR 68-70  . TILT TABLE STUDY N/A 08/21/2011   Procedure: TILT TABLE STUDY;  Surgeon: Deboraha Sprang, MD;  Location: [facility] CATH LAB;  Service: Cardiovascular;  Laterality: N/A;  . TONSILLECTOMY  age 37     OB History    Gravida  0   Para  0   Term  0   Preterm  0   AB  0   Living  0     SAB  0   IAB  0   Ectopic  0   Multiple  0   Live Births  0           Family History  Problem Relation Age of Onset  . Mitral valve prolapse Father   . Hypertension Father   . Dementia Father   . Hypothyroidism Mother   . Colon polyps Brother   . Aneurysm Brother   . Arthritis Brother   . Lupus Sister   . Rheum arthritis Sister   . Osteoarthritis Sister   . Cancer Maternal Aunt        breast  . Breast cancer Cousin     Social History   Tobacco Use  . Smoking status: Never Smoker  . Smokeless tobacco: Never Used  Vaping Use  . Vaping Use: Never used  Substance Use Topics  . Alcohol use: Not Currently    Comment: occasional wine   . Drug use: No    Home Medications Prior to Admission medications   Medication Sig Start Date End Date Taking? Authorizing Provider  ARMOUR THYROID 60 MG tablet Take 60 mg by mouth daily. 03/19/20   [provider]  cyclobenzaprine (FLEXERIL) 10 MG tablet Take 1 tablet  (10 mg total) by mouth 3 (three) times daily as needed for muscle spasms. 03/08/20   Lovorn, Jinny Blossom, MD  DULoxetine (CYMBALTA) 60 MG capsule Take 60 mg by mouth every morning.    [provider]  gabapentin (NEURONTIN) 300 MG capsule TAKE ONE CAPSULE BY MOUTH 2x/day-  increasing dose 04/10/20   Lovorn, Jinny Blossom, MD  hydrocortisone (CORTEF) 10 MG tablet  12/23/19   [provider]  hydrocortisone (CORTEF) 5 MG tablet Take by mouth. 3/4 pill am and 1/4 pill pm 08/24/16   [provider]  levalbuterol (XOPENEX HFA) 45 MCG/ACT inhaler Inhale 1 puff into the lungs every 4 (four) hours as needed. For shortness of breath    [provider]  levocetirizine (XYZAL) 5 MG tablet Take 5 mg by mouth daily.  12/21/10   [provider]  MAGNESIUM MALATE PO Take by mouth as needed.    [provider]  Misc Natural Products (ADRENAL) 200 MG CAPS See admin instructions.    [provider]  Multiple Vitamin (MULTIVITAMIN PO) Take by mouth daily.    [provider]  NALTREXONE HCL PO Take 4 mg by mouth at bedtime.    [provider]  Margurite Auerbach Rebaudiana, (STEVIA PO) Take by mouth daily.    [provider]  traMADol (ULTRAM) 50 MG tablet Take 1 tablet (50 mg total) by mouth every 6 (six) hours as needed. 03/08/20   Lovorn, Jinny Blossom, MD  traZODone (DESYREL) 50 MG tablet Take 150 mg by mouth at bedtime. 04/04/20   [provider]  Vitamin D, Ergocalciferol, (DRISDOL) 1.25 MG (50000 UNIT) CAPS capsule Take 1 capsule (50,000 Units total) by mouth every 7 (seven) days. 04/04/20   Laqueta Linden, MD    Allergies    Iodinated diagnostic agents, Iodine, Levothyroxine, Metronidazole, Tinidazole, Flagyl [metronidazole hcl], and Tinidazole  Review of Systems   Review of Systems  Constitutional: Negative for chills, diaphoresis and fever.  Eyes: Negative for visual disturbance.  Respiratory: Negative for cough and shortness of  breath.   Cardiovascular: Positive for chest pain. Negative for palpitations and leg swelling.  Gastrointestinal: Positive for nausea. Negative for abdominal pain and vomiting.  Genitourinary: Negative for difficulty urinating and dysuria.  Musculoskeletal: Negative for back pain and neck pain.  Skin: Negative for color change and rash.  Neurological: Positive for light-headedness. Negative for dizziness, syncope and headaches.  Psychiatric/Behavioral: Negative for confusion.    Physical Exam Updated Vital Signs BP (!) 150/91 (BP Location: Left Arm)   Pulse 91   Temp 98.2 F (36.8 C) (Oral)   Resp 18   LMP 01/22/2011   SpO2 96%   Physical Exam Vitals and nursing note reviewed.  Constitutional:      General: She is not in acute distress.    Appearance: She is not ill-appearing, toxic-appearing or diaphoretic.  HENT:     Head: Normocephalic.  Eyes:     General: No scleral icterus.       Right eye: No discharge.        Left eye: No discharge.  Neck:     Vascular: No JVD.  Cardiovascular:     Rate and Rhythm: Normal rate and regular rhythm.     Heart sounds: Normal heart sounds.  Pulmonary:     Effort: Pulmonary effort is normal. No tachypnea, bradypnea or respiratory distress.     Breath sounds: Normal breath sounds. No decreased breath sounds, wheezing, rhonchi or rales.  Chest:     Chest wall: Tenderness present. No mass, deformity or swelling.  Abdominal:     General: Abdomen is protuberant. There is no distension.     Palpations: Abdomen is soft. There is no mass or pulsatile mass.     Tenderness: There is no abdominal tenderness. There is no guarding or  rebound.     Hernia: There is no hernia in the umbilical area or ventral area.  Musculoskeletal:     Cervical back: Normal range of motion and neck supple.     Right lower leg: No swelling, deformity, lacerations, tenderness or bony tenderness. No edema.     Left lower leg: No swelling, deformity, lacerations,  tenderness or bony tenderness. No edema.  Skin:    General: Skin is warm and dry.  Neurological:     General: No focal deficit present.     Mental Status: She is alert.  Psychiatric:        Behavior: Behavior is cooperative.     ED Results / Procedures / Treatments   Labs (all labs ordered are listed, but only abnormal results are displayed) Labs Reviewed  COMPREHENSIVE METABOLIC PANEL - Abnormal; Notable for the following components:      Result Value   Creatinine, Ser 1.04 (*)    ALT 56 (*)    All other components within normal limits  I-STAT BETA HCG BLOOD, ED ([facility], [facility], [facility] ONLY) - Abnormal; Notable for the following components:   I-stat hCG, quantitative 7.6 (*)    All other components within normal limits  CBC WITH DIFFERENTIAL/PLATELET  TROPONIN I (HIGH SENSITIVITY)  TROPONIN I (HIGH SENSITIVITY)    EKG EKG Interpretation  Date/Time:  Wednesday April 19 2020 13:36:30 EST Ventricular Rate:  91 PR Interval:    QRS Duration: 95 QT Interval:  347 QTC Calculation: 427 R Axis:   50 Text Interpretation: Sinus rhythm Low voltage, extremity and precordial leads Baseline wander in lead(s) I II aVR aVF V3 V4 V5 12 Lead; Mason-Likar since last tracing no significant change Confirmed by Malvin Johns [id number]) on 04/19/2020 2:25:19 PM   Radiology DG Chest 2 View  Result Date: 04/19/2020 CLINICAL DATA:  Chest pain for 1 week EXAM: CHEST - 2 VIEW COMPARISON:  01/24/2011 FINDINGS: The heart size and mediastinal contours are within normal limits. Both lungs are clear. The visualized skeletal structures are unremarkable. IMPRESSION: No active cardiopulmonary disease. Electronically Signed   By: Kerby Moors M.D.   On: 04/19/2020 14:17    Procedures Procedures   Medications Ordered in ED Medications - No data to display  ED Course  I have reviewed the triage vital signs and the nursing notes.  Pertinent labs & imaging results that were available during my care of the  patient were reviewed by me and considered in my medical decision making (see chart for details).    MDM Rules/Calculators/A&P                          Alert 16-year female no acute distress, nontoxic-appearing.  Patient presents with chief complaint of chest pain.  Patient reports that she has had intermittent chest pain for the last 30 years to her Lyme's disease.  Chest pain has been worse over the last week.  No change in chest pain with exertion, no shortness of breath, no radiation of pain.    We will obtain chest x-ray, EKG, troponin x2, CBC, and CMP.  Heart score 3, EKG shows sinus rhythm, no change since last tracing.  Chest x-ray shows no acute cardiopulmonary disease.  Troponin <2 and 2. ACS less likely.  CBC unremarkable. I-STAT beta-hCG was found to be 7.6. Patient reports that she has not had a menstrual period for the last 5 years. No concern for pregnancy at this time.  Patient's chest  pain is likely an exacerbation of her longstanding musculoskeletal chest pain from her Lyme's disease. We will have patient follow-up with her primary care provider and physician who manages her Lyme's disease.  Discussed results, findings, treatment and follow up. Patient advised of return precautions. Patient verbalized understanding and agreed with plan.    Final Clinical Impression(s) / ED Diagnoses Final diagnoses:  Precordial chest pain    Rx / DC Orders ED Discharge Orders    None       Dyann Ruddle 04/19/20 2339    Malvin Johns, MD 04/20/20 1320

## 2020-04-20 NOTE — Progress Notes (Signed)
Chief Complaint:   OBESITY [name] is here to discuss her progress with her obesity treatment plan along with follow-up of her obesity related diagnoses. [name] is on following a lower carbohydrate, vegetable and lean protein rich diet plan and states she is following her eating plan approximately 40% of the time. [name] states she is walking 1 mile 2 times per week.  Today's visit was #: 6 Starting weight: 201 lb Starting date: 01/03/2020 Today's weight: 198 lbs Today's date: 04/18/2020 Total lbs lost to date: 3 lbs Total lbs lost since last in-office visit: 1 lb  Interim History: Pt still using microwaveable meals from previous plan to get food in consistently. She is taking almonds as snacks and wondering what else can be used for snacks. Pt is getting slightly burnt out on eggs.  Subjective:   1. Vitamin D deficiency Pt denies nausea, vomiting, and muscle weakness but notes fatigue. Pt is on prescription Vit D.  2. Transaminitis Pt's last CMP resulted slightly elevated ALT and AST.   Ref. Range 01/03/2020 11:41  AST Latest Ref Range: 0 - 40 IU/L 44 (H)  ALT Latest Ref Range: 0 - 32 IU/L 69 (H)   Assessment/Plan:   1. Vitamin D deficiency Low Vitamin D level contributes to fatigue and are associated with obesity, breast, and colon cancer. She agrees to continue to take prescription Vitamin D @50 ,000 IU every week and will follow-up for routine testing of Vitamin D, at least 2-3 times per year to avoid over-replacement. No refill needed at this time.  2. Transaminitis Follow up labs in 1 month.  3. Class 1 obesity with serious comorbidity and body mass index (BMI) of 34.0 to 34.9 in adult, unspecified obesity type [name] is currently in the action stage of change. As such, her goal is to continue with weight loss efforts. She has agreed to following a lower carbohydrate, vegetable and lean protein rich diet plan 50 g >= carb/day.   Exercise goals: All adults should avoid  inactivity. Some physical activity is better than none, and adults who participate in any amount of physical activity gain some health benefits.  Behavioral modification strategies: increasing lean protein intake, meal planning and cooking strategies, keeping healthy foods in the home and planning for success.  [name] has agreed to follow-up with our clinic in 2-3 weeks. She was informed of the importance of frequent follow-up visits to maximize her success with intensive lifestyle modifications for her multiple health conditions.   Objective:   Blood pressure 138/83, pulse 90, temperature 98.1 F (36.7 C), temperature source Oral, height 5\' 4"  (1.626 m), weight 198 lb (89.8 kg), last menstrual period 01/22/2011, SpO2 96 %. Body mass index is 33.99 kg/m.  General: Cooperative, alert, well developed, in no acute distress. HEENT: Conjunctivae and lids unremarkable. Cardiovascular: Regular rhythm.  Lungs: Normal work of breathing. Neurologic: No focal deficits.   Lab Results  Component Value Date   CREATININE 1.04 (H) 04/19/2020   BUN 19 04/19/2020   NA 140 04/19/2020   K 3.7 04/19/2020   CL 104 04/19/2020   CO2 24 04/19/2020   Lab Results  Component Value Date   ALT 56 (H) 04/19/2020   AST 37 04/19/2020   ALKPHOS 61 04/19/2020   BILITOT 0.7 04/19/2020   Lab Results  Component Value Date   HGBA1C 5.1 01/03/2020   Lab Results  Component Value Date   INSULIN 11.6 01/03/2020   Lab Results  Component Value Date   TSH  3.920 01/03/2020   Lab Results  Component Value Date   CHOL 198 01/03/2020   HDL 52 01/03/2020   LDLCALC 126 (H) 01/03/2020   TRIG 114 01/03/2020   Lab Results  Component Value Date   WBC 5.9 04/19/2020   HGB 14.8 04/19/2020   HCT 42.5 04/19/2020   MCV 88.5 04/19/2020   PLT 207 04/19/2020    Attestation Statements:   Reviewed by clinician on day of visit: allergies, medications, problem list, medical history, surgical history, family history,  social history, and previous encounter notes.  Time spent on visit including pre-visit chart review and post-visit care and charting was 14 minutes.   Coral Ceo, am acting as transcriptionist for Coralie Common, MD.   I have reviewed the above documentation for accuracy and completeness, and I agree with the above. - Jinny Blossom, MD

## 2020-04-24 ENCOUNTER — Ambulatory Visit
Admission: RE | Admit: 2020-04-24 | Discharge: 2020-04-24 | Disposition: A | Discharge status: Home / Self Care | Payer: 59 | Source: Ambulatory Visit | Attending: Obstetrics and Gynecology | Admitting: Obstetrics and Gynecology

## 2020-04-24 ENCOUNTER — Other Ambulatory Visit: Payer: Self-pay

## 2020-04-24 DIAGNOSIS — N6011 Diffuse cystic mastopathy of right breast: Secondary | ICD-10-CM | POA: Diagnosis not present

## 2020-04-24 DIAGNOSIS — N6489 Other specified disorders of breast: Secondary | ICD-10-CM | POA: Diagnosis not present

## 2020-05-02 ENCOUNTER — Other Ambulatory Visit: Payer: 59

## 2020-05-15 ENCOUNTER — Other Ambulatory Visit: Payer: Self-pay

## 2020-05-15 ENCOUNTER — Encounter (INDEPENDENT_AMBULATORY_CARE_PROVIDER_SITE_OTHER): Payer: Self-pay | Admitting: Family Medicine

## 2020-05-15 ENCOUNTER — Ambulatory Visit (INDEPENDENT_AMBULATORY_CARE_PROVIDER_SITE_OTHER): Payer: 59 | Admitting: Family Medicine

## 2020-05-15 VITALS — BP 131/85 | HR 90 | Temp 97.9°F | Ht 64.0 in | Wt 199.0 lb

## 2020-05-15 DIAGNOSIS — Z9189 Other specified personal risk factors, not elsewhere classified: Secondary | ICD-10-CM

## 2020-05-15 DIAGNOSIS — E669 Obesity, unspecified: Secondary | ICD-10-CM

## 2020-05-15 DIAGNOSIS — E8881 Metabolic syndrome: Secondary | ICD-10-CM | POA: Diagnosis not present

## 2020-05-15 DIAGNOSIS — E559 Vitamin D deficiency, unspecified: Secondary | ICD-10-CM | POA: Diagnosis not present

## 2020-05-15 DIAGNOSIS — Z6834 Body mass index (BMI) 34.0-34.9, adult: Secondary | ICD-10-CM | POA: Diagnosis not present

## 2020-05-15 MED ORDER — VITAMIN D (ERGOCALCIFEROL) 1.25 MG (50000 UNIT) PO CAPS: 50000.0000 [IU] | ORAL_CAPSULE | ORAL | 0 refills | Status: DC

## 2020-05-16 ENCOUNTER — Ambulatory Visit
Admission: RE | Admit: 2020-05-16 | Discharge: 2020-05-16 | Disposition: A | Discharge status: Home / Self Care | Payer: 59 | Source: Ambulatory Visit | Attending: Internal Medicine | Admitting: Internal Medicine

## 2020-05-16 DIAGNOSIS — K760 Fatty (change of) liver, not elsewhere classified: Secondary | ICD-10-CM

## 2020-05-16 NOTE — Progress Notes (Signed)
Chief Complaint:   OBESITY [name] is here to discuss her progress with her obesity treatment plan along with follow-up of her obesity related diagnoses. [name] is on following a lower carbohydrate, vegetable and lean protein rich diet plan and states she is following her eating plan approximately 50% of the time. [name] states she is walking 40 minutes 2 times per week.  Today's visit was #: 7 Starting weight: 201 lbs Starting date: 01/03/2020 Today's weight: 199 lbs Today's date: 05/15/2020 Total lbs lost to date: 2 lbs Total lbs lost since last in-office visit: 0  Interim History: [name] was recently diagnosed with [facility] Spotted Fever and [name] Patient virus. She is going on a cruise next week and is wondering about protein bars to bring with her. She is wondering about options to stick with the plan while away. She is feeling some non-scale victories- recently fit into size 12 jeans [name]).  Subjective:   1. Insulin resistance [name]'s last A1c 5.1, with an insulin level of 11.6. She is not on medication. She has minimal carb intake. She is on low carb plan.  2. Vitamin D deficiency [name]'s last Vit D level was 35.0. She denies nausea, vomiting, and muscle weakness but notes fatigue. Pt is on prescription Vit D.  3. At risk for deficient intake of food [name] is at risk for deficient intake of food due to not eating much due to RMSF.  Assessment/Plan:   1. Insulin resistance [name] will continue to work on weight loss, exercise, and decreasing simple carbohydrates to help decrease the risk of diabetes. [name] agreed to follow-up with [name] as directed to closely monitor her progress. Repeat labs in 1 month.  2. Vitamin D deficiency Low Vitamin D level contributes to fatigue and are associated with obesity, breast, and colon cancer. She agrees to continue to take prescription Vitamin D @50 ,000 IU every week and will follow-up for routine testing of Vitamin D, at least 2-3 times  per year to avoid over-replacement.  - Vitamin D, Ergocalciferol, (DRISDOL) 1.25 MG (50000 UNIT) CAPS capsule; Take 1 capsule (50,000 Units total) by mouth every 7 (seven) days.  Dispense: 4 capsule; Refill: 0  3. At risk for deficient intake of food [name] was given approximately 15 minutes of deficit intake of food prevention counseling today. [name] is at risk for eating too few calories based on current food recall. She was encouraged to focus on meeting caloric and protein goals according to her recommended meal plan.   4. Class 1 obesity with serious comorbidity and body mass index (BMI) of 34.0 to 34.9 in adult, unspecified obesity type [name] is currently in the action stage of change. As such, her goal is to continue with weight loss efforts. She has agreed to following a lower carbohydrate, vegetable and lean protein rich diet plan.   Exercise goals: As is  Behavioral modification strategies: increasing lean protein intake, meal planning and cooking strategies, keeping healthy foods in the home and travel eating strategies.  [name] has agreed to follow-up with our clinic in 3 weeks. She was informed of the importance of frequent follow-up visits to maximize her success with intensive lifestyle modifications for her multiple health conditions.   Objective:   Blood pressure 131/85, pulse 90, temperature 97.9 F (36.6 C), temperature source Oral, height 5\' 4"  (1.626 m), weight 199 lb (90.3 kg), last menstrual period 01/22/2011, SpO2 97 %. Body mass index is 34.16 kg/m.  General: Cooperative, alert, well developed, in no  acute distress. HEENT: Conjunctivae and lids unremarkable. Cardiovascular: Regular rhythm.  Lungs: Normal work of breathing. Neurologic: No focal deficits.   Lab Results  Component Value Date   CREATININE 1.04 (H) 04/19/2020   BUN 19 04/19/2020   NA 140 04/19/2020   K 3.7 04/19/2020   CL 104 04/19/2020   CO2 24 04/19/2020   Lab Results  Component Value Date    ALT 56 (H) 04/19/2020   AST 37 04/19/2020   ALKPHOS 61 04/19/2020   BILITOT 0.7 04/19/2020   Lab Results  Component Value Date   HGBA1C 5.1 01/03/2020   Lab Results  Component Value Date   INSULIN 11.6 01/03/2020   Lab Results  Component Value Date   TSH 3.920 01/03/2020   Lab Results  Component Value Date   CHOL 198 01/03/2020   HDL 52 01/03/2020   LDLCALC 126 (H) 01/03/2020   TRIG 114 01/03/2020   Lab Results  Component Value Date   WBC 5.9 04/19/2020   HGB 14.8 04/19/2020   HCT 42.5 04/19/2020   MCV 88.5 04/19/2020   PLT 207 04/19/2020    Attestation Statements:   Reviewed by clinician on day of visit: allergies, medications, problem list, medical history, surgical history, family history, social history, and previous encounter notes.  Coral Ceo, am acting as transcriptionist for Coralie Common, MD.   I have reviewed the above documentation for accuracy and completeness, and I agree with the above. - Jinny Blossom, MD

## 2020-05-23 ENCOUNTER — Ambulatory Visit: Payer: Medicare Other | Admitting: Physician Assistant

## 2020-06-05 ENCOUNTER — Other Ambulatory Visit: Payer: Self-pay | Admitting: *Deleted

## 2020-06-05 ENCOUNTER — Encounter: Payer: 59 | Attending: Physical Medicine and Rehabilitation | Admitting: Physical Medicine and Rehabilitation

## 2020-06-05 ENCOUNTER — Other Ambulatory Visit: Payer: Self-pay

## 2020-06-05 ENCOUNTER — Ambulatory Visit (INDEPENDENT_AMBULATORY_CARE_PROVIDER_SITE_OTHER): Payer: 59 | Admitting: Family Medicine

## 2020-06-05 ENCOUNTER — Encounter: Payer: Self-pay | Admitting: Physical Medicine and Rehabilitation

## 2020-06-05 VITALS — BP 101/81 | HR 82 | Temp 98.0°F | Ht 64.0 in | Wt 202.4 lb

## 2020-06-05 DIAGNOSIS — E559 Vitamin D deficiency, unspecified: Secondary | ICD-10-CM | POA: Diagnosis present

## 2020-06-05 DIAGNOSIS — M797 Fibromyalgia: Secondary | ICD-10-CM

## 2020-06-05 DIAGNOSIS — M7918 Myalgia, other site: Secondary | ICD-10-CM

## 2020-06-05 DIAGNOSIS — Z8639 Personal history of other endocrine, nutritional and metabolic disease: Secondary | ICD-10-CM

## 2020-06-05 NOTE — Patient Instructions (Signed)
Plan: 1. Theracane- for trigger points relaxation- hold pressure 2 minutes minimum- don't massage area! Hold pressure on tight spots.    2. Vit D level - last I could find was 35 in 11/21- I would suggest goal would be ~ 40-50 - so suggest increasing Vit D- doesn't take daily, because healthy weight and wellness- I would talk to them to get your goal- needs ot take a separate Vit D Supplement- I personally suggest another 2000 units daily-   3. Patient here for trigger point injections for  Consent done and on chart.  Cleaned areas with alcohol and injected using a 27 gauge 1.5 inch needle  Injected  Using 1% Lidocaine with no EPI  Upper traps B/L Levators B/L Posterior scalenes BL Middle scalenes Splenius Capitus  Pectoralis Major B/L Rhomboids B/L  Infraspinatus Teres Major/minor Thoracic paraspinals BL Lumbar paraspinals Other injections-    Patient's level of pain prior was Current level of pain after injections is  There was no bleeding or complications.  Patient was advised to drink a lot of water on day after injections to flush system Will have increased soreness for 12-48 hours after injections.  Can use Lidocaine patches the day AFTER injections Can use theracane on day of injections in places didn't inject Can use heating pad 4-6 hours AFTER injections  4. Doesn't need refills on tramadol, gabapentin or Naltrexone.    5. F/U in 6-8 weeks for possible trigger point injections.

## 2020-06-05 NOTE — Progress Notes (Signed)
Pt is a 60 yr old female with hx of asthma, DDD, DJD, Cat scratch disease, Hashimoto's thyroiditis,Lyme's disease, EBV, chronic low back pain , fibromyalgia, and generalized chronic pain here for f/u on generalized pain.    Just got back from 4 days cruise. Feels so good in tropical weather.   Still hasn't gotten theracane.   [facility] Spotted fever and EBV- active When retested.  Faxed Lyme doctor  Did have Spinal HA's- that are better- Minocycline has made them better.  Going to get more Lyme's testing that's really specific.  Sick of being sick!  Cannot find exercises from PT- so frustrated- because wants to do HEP.   Likes Naltrexone- sleeping better- less dreaming.  Little trouble getting to sleep.  Gets 8-9 hours usually per night. Still has ~ 8-10 pills left, so hasn't been taking as prescribed.    Was at 3mg  daily;  Joint pain back with RMSF? But hard to tell if LDN helping.  But average pain level 5/10 right now.   Was told to increase Cortisone to 2 whole pills in Am and 1 in afternoon.  Wants to talk with Lyme doctor today- taking 1 tab in Am and 1/2 in afternoon    Plan: 1. Theracane- for trigger points relaxation- hold pressure 2 minutes minimum- don't massage area! Hold pressure on tight spots.    2. Vit D level - last I could find was 35 in 11/21- I would suggest goal would be ~ 40-50 - so suggest increasing Vit D- doesn't take daily, because healthy weight and wellness- I would talk to them to get your goal- needs ot take a separate Vit D Supplement- I personally suggest another 2000 units daily-   3. Patient here for trigger point injections for  Consent done and on chart.  Cleaned areas with alcohol and injected using a 27 gauge 1.5 inch needle  Injected  Using 1% Lidocaine with no EPI  Upper traps B/L Levators B/L Posterior scalenes BL Middle scalenes Splenius Capitus  Pectoralis Major B/L Rhomboids B/L  Infraspinatus Teres  Major/minor Thoracic paraspinals BL Lumbar paraspinals Other injections-    Patient's level of pain prior was Current level of pain after injections is  There was no bleeding or complications.  Patient was advised to drink a lot of water on day after injections to flush system Will have increased soreness for 12-48 hours after injections.  Can use Lidocaine patches the day AFTER injections Can use theracane on day of injections in places didn't inject Can use heating pad 4-6 hours AFTER injections  4. Doesn't need refills on tramadol, gabapentin or Naltrexone.    5. F/U in 6-8 weeks for possible trigger point injections. Will assess at that time. As well as regular f/u on Pain issues.

## 2020-06-08 DIAGNOSIS — M2550 Pain in unspecified joint: Secondary | ICD-10-CM | POA: Diagnosis not present

## 2020-06-08 DIAGNOSIS — R5383 Other fatigue: Secondary | ICD-10-CM | POA: Diagnosis not present

## 2020-06-12 ENCOUNTER — Encounter (INDEPENDENT_AMBULATORY_CARE_PROVIDER_SITE_OTHER): Payer: Self-pay | Admitting: Family Medicine

## 2020-06-12 ENCOUNTER — Ambulatory Visit (INDEPENDENT_AMBULATORY_CARE_PROVIDER_SITE_OTHER): Payer: 59 | Admitting: Family Medicine

## 2020-06-12 ENCOUNTER — Other Ambulatory Visit: Payer: Self-pay

## 2020-06-12 VITALS — BP 139/80 | HR 100 | Temp 97.7°F | Ht 64.0 in | Wt 201.0 lb

## 2020-06-12 DIAGNOSIS — E669 Obesity, unspecified: Secondary | ICD-10-CM

## 2020-06-12 DIAGNOSIS — K760 Fatty (change of) liver, not elsewhere classified: Secondary | ICD-10-CM | POA: Diagnosis not present

## 2020-06-12 DIAGNOSIS — Z6834 Body mass index (BMI) 34.0-34.9, adult: Secondary | ICD-10-CM | POA: Diagnosis not present

## 2020-06-12 DIAGNOSIS — E559 Vitamin D deficiency, unspecified: Secondary | ICD-10-CM | POA: Diagnosis not present

## 2020-06-13 NOTE — Progress Notes (Signed)
Chief Complaint:   OBESITY [name] is here to discuss her progress with her obesity treatment plan along with follow-up of her obesity related diagnoses. [name] is on following a lower carbohydrate, vegetable and lean protein rich diet plan and states she is following her eating plan approximately 75% of the time. [name] states she is walking 15-30 minutes 2 times per week.  Today's visit was #: 8 Starting weight: 201 lbs Starting date: 01/03/2020 Today's weight: 201 lbs Today's date: 06/12/2020 Total lbs lost to date: 0 Total lbs lost since last in-office visit: 0  Interim History: [name] returned from a cruise vacation and voices she gained ~6 lbs while vacationing. She is wondering about doing vegenaise with meals to help make food more palatable. She wants to get back on low carb plan.   Subjective:   1. Vitamin D deficiency [name] is on OTC Vit D now. She reports fatigue.  2. NAFLD (nonalcoholic fatty liver disease) [name]'s last ALT was 56, AST 37, and alkaline phosphate 61. Ultrasound showing NAFLD.  Assessment/Plan:   1. Vitamin D deficiency Low Vitamin D level contributes to fatigue and are associated with obesity, breast, and colon cancer. She agrees to continue to take OTC Vitamin D and will follow-up for routine testing of Vitamin D, at least 2-3 times per year to avoid over-replacement.  2. NAFLD (nonalcoholic fatty liver disease) We discussed the likely diagnosis of non-alcoholic fatty liver disease today and how this condition is obesity related. [name] was educated the importance of weight loss. [name] agreed to continue with her weight loss efforts with healthier diet and exercise as an essential part of her treatment plan. Consider GLP-1 pending FDA approval for NAFLD.  3. Class 1 obesity with serious comorbidity and body mass index (BMI) of 34.0 to 34.9 in adult, unspecified obesity type [name] is currently in the action stage of change. As such, her goal is to continue  with weight loss efforts. She has agreed to following a lower carbohydrate, vegetable and lean protein rich diet plan.   Exercise goals: As is  Behavioral modification strategies: increasing lean protein intake, meal planning and cooking strategies and keeping healthy foods in the home.  [name] has agreed to follow-up with our clinic in 3 weeks. She was informed of the importance of frequent follow-up visits to maximize her success with intensive lifestyle modifications for her multiple health conditions.   Objective:   Blood pressure 139/80, pulse 100, temperature 97.7 F (36.5 C), height 5\' 4"  (1.626 m), weight 201 lb (91.2 kg), last menstrual period 01/22/2011, SpO2 99 %. Body mass index is 34.5 kg/m.  General: Cooperative, alert, well developed, in no acute distress. HEENT: Conjunctivae and lids unremarkable. Cardiovascular: Regular rhythm.  Lungs: Normal work of breathing. Neurologic: No focal deficits.   Lab Results  Component Value Date   CREATININE 1.04 (H) 04/19/2020   BUN 19 04/19/2020   NA 140 04/19/2020   K 3.7 04/19/2020   CL 104 04/19/2020   CO2 24 04/19/2020   Lab Results  Component Value Date   ALT 56 (H) 04/19/2020   AST 37 04/19/2020   ALKPHOS 61 04/19/2020   BILITOT 0.7 04/19/2020   Lab Results  Component Value Date   HGBA1C 5.1 01/03/2020   Lab Results  Component Value Date   INSULIN 11.6 01/03/2020   Lab Results  Component Value Date   TSH 3.920 01/03/2020   Lab Results  Component Value Date   CHOL 198 01/03/2020  HDL 52 01/03/2020   LDLCALC 126 (H) 01/03/2020   TRIG 114 01/03/2020   Lab Results  Component Value Date   WBC 5.9 04/19/2020   HGB 14.8 04/19/2020   HCT 42.5 04/19/2020   MCV 88.5 04/19/2020   PLT 207 04/19/2020    Attestation Statements:   Reviewed by clinician on day of visit: allergies, medications, problem list, medical history, surgical history, family history, social history, and previous encounter notes.  Time  spent on visit including pre-visit chart review and post-visit care and charting was 15 minutes.   Coral Ceo, am acting as transcriptionist for Coralie Common, MD.   I have reviewed the above documentation for accuracy and completeness, and I agree with the above. - Jinny Blossom, MD

## 2020-07-06 ENCOUNTER — Ambulatory Visit (INDEPENDENT_AMBULATORY_CARE_PROVIDER_SITE_OTHER): Payer: 59 | Admitting: Family Medicine

## 2020-07-17 ENCOUNTER — Ambulatory Visit (INDEPENDENT_AMBULATORY_CARE_PROVIDER_SITE_OTHER): Payer: 59 | Admitting: Family Medicine

## 2020-07-31 ENCOUNTER — Encounter: Payer: 59 | Admitting: Physical Medicine and Rehabilitation

## 2020-08-05 ENCOUNTER — Encounter (INDEPENDENT_AMBULATORY_CARE_PROVIDER_SITE_OTHER): Payer: Self-pay

## 2020-08-08 ENCOUNTER — Ambulatory Visit (INDEPENDENT_AMBULATORY_CARE_PROVIDER_SITE_OTHER): Payer: 59 | Admitting: Family Medicine

## 2020-08-10 ENCOUNTER — Ambulatory Visit (INDEPENDENT_AMBULATORY_CARE_PROVIDER_SITE_OTHER): Payer: 59 | Admitting: Family Medicine

## 2020-08-10 ENCOUNTER — Encounter (INDEPENDENT_AMBULATORY_CARE_PROVIDER_SITE_OTHER): Payer: Self-pay | Admitting: Family Medicine

## 2020-08-10 ENCOUNTER — Other Ambulatory Visit: Payer: Self-pay

## 2020-08-10 ENCOUNTER — Encounter (INDEPENDENT_AMBULATORY_CARE_PROVIDER_SITE_OTHER): Payer: Self-pay

## 2020-08-10 VITALS — BP 116/80 | HR 84 | Temp 98.0°F | Ht 64.0 in | Wt 202.0 lb

## 2020-08-10 DIAGNOSIS — M797 Fibromyalgia: Secondary | ICD-10-CM

## 2020-08-10 DIAGNOSIS — K760 Fatty (change of) liver, not elsewhere classified: Secondary | ICD-10-CM | POA: Diagnosis not present

## 2020-08-10 DIAGNOSIS — R7301 Impaired fasting glucose: Secondary | ICD-10-CM

## 2020-08-10 DIAGNOSIS — Z9189 Other specified personal risk factors, not elsewhere classified: Secondary | ICD-10-CM | POA: Diagnosis not present

## 2020-08-10 DIAGNOSIS — Z79899 Other long term (current) drug therapy: Secondary | ICD-10-CM

## 2020-08-10 DIAGNOSIS — E669 Obesity, unspecified: Secondary | ICD-10-CM

## 2020-08-10 DIAGNOSIS — Z6834 Body mass index (BMI) 34.0-34.9, adult: Secondary | ICD-10-CM

## 2020-08-10 MED ORDER — OZEMPIC (0.25 OR 0.5 MG/DOSE) 2 MG/1.5ML ~~LOC~~ SOPN: 0.2500 mg | PEN_INJECTOR | SUBCUTANEOUS | 0 refills | Status: DC

## 2020-08-16 NOTE — Progress Notes (Signed)
Chief Complaint:   OBESITY [name] is here to discuss her progress with her obesity treatment plan along with follow-up of her obesity related diagnoses.   Today's visit was #: 9 Starting weight: 201 lbs Starting date: 01/03/2020 Today's weight: 202 lbs Today's date: 08/10/2020 Weight change since last visit: +1 lb Total lbs lost to date: +1 lb Body mass index is 34.67 kg/m.   Interim History:  [name] has increase polyphagia in the setting of insulin resistance. Current Meal Plan: following a lower carbohydrate, vegetable and lean protein rich diet plan for 50-60% of the time.  Current Exercise Plan: Walking 1 mile 5 times per week.  Assessment/Plan:   1. Impaired fasting glucose [name] endorses increased polyphagia.    Plan:  Start Ozempic 0.25 mg subcutaneously weekly, as per below.  - Start Semaglutide,0.25 or 0.5MG /DOS, (OZEMPIC, 0.25 OR 0.5 MG/DOSE,) 2 MG/1.5ML SOPN; Inject 0.25 mg into the skin once a week.  Dispense: 1.5 mL; Refill: 0  2. Medication management Followed by [facility]; on [place] Thyroid.  History of Hashimoto's.  Takes hydrocortisone daily.  Chronic pain, followed by Pain Management.  3. NAFLD (nonalcoholic fatty liver disease) Lab results reviewed with patient. Counseling: Intensive lifestyle modifications are the first line treatment for this issue. We discussed several lifestyle modifications today and she will continue to work on diet, exercise and weight loss efforts.   Counseling:  NAFLD is an umbrella term that encompasses a disease spectrum that includes steatosis (fat) without inflammation, steatohepatitis (NASH; fat + inflammation in a characteristic pattern), and cirrhosis. Bland steatosis is felt to be a benign condition, with extremely low to no risk of progression to cirrhosis, whereas NASH can progress to cirrhosis. The mainstay of treatment of NAFLD includes lifestyle modification to achieve weight loss, at least 7% of current  body weight. Low carbohydrate diets can be beneficial in improving NAFLD liver histology. Additionally, exercise, even the absence of weight loss can have beneficial effects on the patient's metabolic profile and liver health. We recommend that their metabolic comorbidities be aggressively managed, as patients with NAFLD are at increased risk of coronary artery disease.  4. Fibromyalgia Intensive lifestyle modifications are the first line treatment for this issue. We discussed several lifestyle modifications today and she will continue to work on diet, exercise and weight loss efforts.We will continue to monitor. Orders and follow up as documented in patient record.   Counseling Try [email or web address]/, which is a series of self-care modules designed to teach patients several techniques to manage pain.   5. At risk for heart disease Due to [name]'s current state of health and medical condition(s), she is at a higher risk for heart disease.  This puts the patient at much greater risk to subsequently develop cardiopulmonary conditions that can significantly affect patient's quality of life in a negative manner.    At least 8 minutes were spent on counseling [name] about these concerns today. Evidence-based interventions for health behavior change were utilized today including the discussion of self monitoring techniques, problem-solving barriers, and SMART goal setting techniques.  Specifically, regarding patient's less desirable eating habits and patterns, we employed the technique of small changes when [name] has not been able to fully commit to her prudent nutritional plan.  6. Obesity, current BMI 34.7  Course: Fortune is currently in the action stage of change. As such, her goal is to continue with weight loss efforts.   Nutrition goals: She has agreed to following a lower carbohydrate, vegetable and lean  protein rich diet plan.   Exercise goals:  As tolerated.  Behavioral  modification strategies: increasing lean protein intake, decreasing simple carbohydrates, and increasing vegetables.  [name] has agreed to follow-up with our clinic in 2-3 weeks with Dr. Jearld Shines. She was informed of the importance of frequent follow-up visits to maximize her success with intensive lifestyle modifications for her multiple health conditions.   Objective:   Blood pressure 116/80, pulse 84, temperature 98 F (36.7 C), temperature source Oral, height 5\' 4"  (1.626 m), weight 202 lb (91.6 kg), last menstrual period 01/22/2011, SpO2 94 %. Body mass index is 34.67 kg/m.  General: Cooperative, alert, well developed, in no acute distress. HEENT: Conjunctivae and lids unremarkable. Cardiovascular: Regular rhythm.  Lungs: Normal work of breathing. Neurologic: No focal deficits.   Lab Results  Component Value Date   CREATININE 1.04 (H) 04/19/2020   BUN 19 04/19/2020   NA 140 04/19/2020   K 3.7 04/19/2020   CL 104 04/19/2020   CO2 24 04/19/2020   Lab Results  Component Value Date   ALT 56 (H) 04/19/2020   AST 37 04/19/2020   ALKPHOS 61 04/19/2020   BILITOT 0.7 04/19/2020   Lab Results  Component Value Date   HGBA1C 5.1 01/03/2020   Lab Results  Component Value Date   INSULIN 11.6 01/03/2020   Lab Results  Component Value Date   TSH 3.920 01/03/2020   Lab Results  Component Value Date   CHOL 198 01/03/2020   HDL 52 01/03/2020   LDLCALC 126 (H) 01/03/2020   TRIG 114 01/03/2020   Lab Results  Component Value Date   WBC 5.9 04/19/2020   HGB 14.8 04/19/2020   HCT 42.5 04/19/2020   MCV 88.5 04/19/2020   PLT 207 04/19/2020   Attestation Statements:   Reviewed by clinician on day of visit: allergies, medications, problem list, medical history, surgical history, family history, social history, and previous encounter notes.  I, Water quality scientist, CMA, am acting as transcriptionist for Briscoe Deutscher, DO  I have reviewed the above documentation for accuracy and  completeness, and I agree with the above. Briscoe Deutscher, DO

## 2020-08-31 ENCOUNTER — Ambulatory Visit (INDEPENDENT_AMBULATORY_CARE_PROVIDER_SITE_OTHER): Payer: 59 | Admitting: Family Medicine

## 2020-09-11 NOTE — Telephone Encounter (Signed)
Erroneous Encounter

## 2020-09-27 ENCOUNTER — Encounter (INDEPENDENT_AMBULATORY_CARE_PROVIDER_SITE_OTHER): Payer: Self-pay | Admitting: Family Medicine

## 2020-09-27 ENCOUNTER — Other Ambulatory Visit: Payer: Self-pay

## 2020-09-27 ENCOUNTER — Ambulatory Visit (INDEPENDENT_AMBULATORY_CARE_PROVIDER_SITE_OTHER): Payer: 59 | Admitting: Family Medicine

## 2020-09-27 VITALS — BP 118/78 | HR 89 | Temp 98.1°F | Ht 64.0 in | Wt 199.0 lb

## 2020-09-27 DIAGNOSIS — R7301 Impaired fasting glucose: Secondary | ICD-10-CM

## 2020-09-27 DIAGNOSIS — M797 Fibromyalgia: Secondary | ICD-10-CM

## 2020-09-27 DIAGNOSIS — Z9189 Other specified personal risk factors, not elsewhere classified: Secondary | ICD-10-CM

## 2020-09-27 DIAGNOSIS — E669 Obesity, unspecified: Secondary | ICD-10-CM | POA: Diagnosis not present

## 2020-09-27 DIAGNOSIS — K760 Fatty (change of) liver, not elsewhere classified: Secondary | ICD-10-CM

## 2020-09-27 DIAGNOSIS — Z6834 Body mass index (BMI) 34.0-34.9, adult: Secondary | ICD-10-CM

## 2020-09-27 MED ORDER — SEMAGLUTIDE (1 MG/DOSE) 4 MG/3ML ~~LOC~~ SOPN
1.0000 mg | PEN_INJECTOR | SUBCUTANEOUS | 0 refills | Status: DC
Start: 2020-09-27 — End: 2020-11-02

## 2020-09-27 MED ORDER — OZEMPIC (0.25 OR 0.5 MG/DOSE) 2 MG/1.5ML ~~LOC~~ SOPN: 0.5000 mg | PEN_INJECTOR | SUBCUTANEOUS | 0 refills | Status: DC

## 2020-10-02 ENCOUNTER — Encounter: Payer: 59 | Attending: Physical Medicine and Rehabilitation | Admitting: Physical Medicine and Rehabilitation

## 2020-10-02 ENCOUNTER — Encounter: Payer: Self-pay | Admitting: Physical Medicine and Rehabilitation

## 2020-10-02 ENCOUNTER — Other Ambulatory Visit: Payer: Self-pay

## 2020-10-02 VITALS — BP 147/96 | HR 93 | Temp 98.0°F | Ht 64.0 in | Wt 205.8 lb

## 2020-10-02 DIAGNOSIS — M545 Low back pain, unspecified: Secondary | ICD-10-CM | POA: Diagnosis not present

## 2020-10-02 DIAGNOSIS — G8929 Other chronic pain: Secondary | ICD-10-CM | POA: Diagnosis not present

## 2020-10-02 DIAGNOSIS — M7918 Myalgia, other site: Secondary | ICD-10-CM | POA: Diagnosis not present

## 2020-10-02 DIAGNOSIS — E063 Autoimmune thyroiditis: Secondary | ICD-10-CM | POA: Insufficient documentation

## 2020-10-02 DIAGNOSIS — M797 Fibromyalgia: Secondary | ICD-10-CM

## 2020-10-02 MED ORDER — TRAMADOL HCL 50 MG PO TABS: 50.0000 mg | ORAL_TABLET | Freq: Four times a day (QID) | ORAL | 5 refills | Status: DC | PRN

## 2020-10-02 MED ORDER — GABAPENTIN 300 MG PO CAPS: ORAL_CAPSULE | ORAL | 11 refills | Status: DC

## 2020-10-02 NOTE — Progress Notes (Signed)
Chief Complaint:   OBESITY [name] is here to discuss her progress with her obesity treatment plan along with follow-up of her obesity related diagnoses.   Today's visit was #: 10 Starting weight: 201 lbs Starting date: 01/03/2020 Today's weight: 199 lbs Today's date: 09/27/2020 Weight change since last visit: 3 lbs Total lbs lost to date: 3 lbs Body mass index is 34.16 kg/m.  Total weight loss percentage to date: -1.00%  Interim History:  [name] says she has no side effects with Ozempic and would like to increase the dose.  She has gone to the [place] and [place] since her last visit.  Current Meal Plan: following a lower carbohydrate, vegetable and lean protein rich diet plan for 70% of the time.  Current Exercise Plan: Walking for 1 mile 6-7 times per week. Current Anti-Obesity Medications: Ozempic 0.25 mg subcutaneously weekly. Side effects: None.  Assessment/Plan:   1. Impaired fasting glucose, with polyphagia Improving. [name] is taking Ozempic 0.25 mg subcutaneously weekly.  No side effects.  Plan:  Increase Ozempic to 0.5 mg subcutaneously weekly.  - Refill Semaglutide,0.25 or 0.'5MG'$ /DOS, (OZEMPIC, 0.25 OR 0.5 MG/DOSE,) 2 MG/1.5ML SOPN; Inject 0.5 mg into the skin once a week.  Dispense: 1.5 mL; Refill: 0 - Semaglutide, 1 MG/DOSE, 4 MG/3ML SOPN; Inject 1 mg as directed once a week.  Dispense: 3 mL; Refill: 0  2. NAFLD (nonalcoholic fatty liver disease) NAFLD is an umbrella term that encompasses a disease spectrum that includes steatosis (fat) without inflammation, steatohepatitis (NASH; fat + inflammation in a characteristic pattern), and cirrhosis. Bland steatosis is felt to be a benign condition, with extremely low to no risk of progression to cirrhosis, whereas NASH can progress to cirrhosis. The mainstay of treatment of NAFLD includes lifestyle modification to achieve weight loss, at least 7% of current body weight. Low carbohydrate diets can be beneficial in improving  NAFLD liver histology. Additionally, exercise, even the absence of weight loss can have beneficial effects on the patient's metabolic profile and liver health.   3. Fibromyalgia Intensive lifestyle modifications are the first line treatment for this issue. We discussed several lifestyle modifications today and she will continue to work on diet, exercise and weight loss efforts.We will continue to monitor. Orders and follow up as documented in patient record.   Counseling Try [email or web address]/, which is a series of self-care modules designed to teach patients several techniques to manage pain.  4. At risk for heart disease Due to [name]'s current state of health and medical condition(s), she is at a higher risk for heart disease.  This puts the patient at much greater risk to subsequently develop cardiopulmonary conditions that can significantly affect patient's quality of life in a negative manner.    At least 8 minutes were spent on counseling [name] about these concerns today. Evidence-based interventions for health behavior change were utilized today including the discussion of self monitoring techniques, problem-solving barriers, and SMART goal setting techniques.  Specifically, regarding patient's less desirable eating habits and patterns, we employed the technique of small changes when [name] has not been able to fully commit to her prudent nutritional plan.  5. Obesity, current BMI 34.3  Course: [name] is currently in the action stage of change. As such, her goal is to continue with weight loss efforts.   Nutrition goals: She has agreed to following a lower carbohydrate, vegetable and lean protein rich diet plan.   Exercise goals:  As is.  Behavioral modification strategies: increasing lean protein intake, decreasing  simple carbohydrates, increasing vegetables, and increasing water intake.  [name] has agreed to follow-up with our clinic in 4 weeks. She was informed of the  importance of frequent follow-up visits to maximize her success with intensive lifestyle modifications for her multiple health conditions.   Objective:   Blood pressure 118/78, pulse 89, temperature 98.1 F (36.7 C), temperature source Oral, height '5\' 4"'$  (1.626 m), weight 199 lb (90.3 kg), last menstrual period 01/22/2011, SpO2 96 %. Body mass index is 34.16 kg/m.  General: Cooperative, alert, well developed, in no acute distress. HEENT: Conjunctivae and lids unremarkable. Cardiovascular: Regular rhythm.  Lungs: Normal work of breathing. Neurologic: No focal deficits.   Lab Results  Component Value Date   CREATININE 1.04 (H) 04/19/2020   BUN 19 04/19/2020   NA 140 04/19/2020   K 3.7 04/19/2020   CL 104 04/19/2020   CO2 24 04/19/2020   Lab Results  Component Value Date   ALT 56 (H) 04/19/2020   AST 37 04/19/2020   ALKPHOS 61 04/19/2020   BILITOT 0.7 04/19/2020   Lab Results  Component Value Date   HGBA1C 5.1 01/03/2020   Lab Results  Component Value Date   INSULIN 11.6 01/03/2020   Lab Results  Component Value Date   TSH 3.920 01/03/2020   Lab Results  Component Value Date   CHOL 198 01/03/2020   HDL 52 01/03/2020   LDLCALC 126 (H) 01/03/2020   TRIG 114 01/03/2020   Lab Results  Component Value Date   VD25OH 35.0 01/03/2020   Lab Results  Component Value Date   WBC 5.9 04/19/2020   HGB 14.8 04/19/2020   HCT 42.5 04/19/2020   MCV 88.5 04/19/2020   PLT 207 04/19/2020   Attestation Statements:   Reviewed by clinician on day of visit: allergies, medications, problem list, medical history, surgical history, family history, social history, and previous encounter notes.  I, Water quality scientist, CMA, am acting as transcriptionist for Briscoe Deutscher, DO  I have reviewed the above documentation for accuracy and completeness, and I agree with the above. Briscoe Deutscher, DO

## 2020-10-02 NOTE — Patient Instructions (Signed)
Plan: Educated/demonstrated how to use theracane A. Curved ball for general back  B. Short ball is for pecs, back of neck and upper traps and top of shoulders  C. Long ball is for side of neck/scalenes and levators- corner of neck and shoulder.   D. Tennis ball- for back of thighs or buttocks- hold 5 or more minutes.   2. Will refill Gabapentin 300 mg BID with 11 refills and Tramadol 50 mg q6 hours prn- #120 - still had 2 refills left- is due- sent in both  3. Patient here for trigger point injections for  Consent done and on chart.  Cleaned areas with alcohol and injected using a 27 gauge 1.5 inch needle  Injected 4cc Using 1% Lidocaine with no EPI  Upper traps B/L Levators R only Posterior scalenes R only Middle scalenes Splenius Capitus Pectoralis Major tight but didn't inject Rhomboids Infraspinatus Teres Major/minor Thoracic paraspinals B/L  Lumbar paraspinals Other injections-  B/L flexor part of forearms B/L    Patient's level of pain prior was 5/10 Current level of pain after injections is 4/10  There was no bleeding or complications.  Patient was advised to drink a lot of water on day after injections to flush system Will have increased soreness for 12-48 hours after injections.  Can use Lidocaine patches the day AFTER injections Can use theracane on day of injections in places didn't inject Can use heating pad 4-6 hours AFTER injections  4. Pectoralis stretch- stand in open doorway and put arms at 90 degrees against doorway and push chest forward and pull a little with arms. And hold stretch 90-120 seconds  Can work arms up and do stretch again if need be.   5. Discussed fostering/adopting a small child- just know less sleep won't help pain issues .  6. F/U 3 months- trigger point injections.

## 2020-10-02 NOTE — Progress Notes (Signed)
Pt is a 60 yr old female (with birthday yesterday)  with hx of asthma, DDD, DJD,  Cat scratch disease, Hashimoto's thyroiditis,Lyme's disease, EBV, chronic low back pain , fibromyalgia, and generalized chronic pain here for f/u on chronic pain issues.    Going to counselor for marriage.   Wants trigger point injections today. It does help.   Naltrexone- doctor needs to follow up on- see if she can get again.   Had problems with Ozempic- dose increased but hasn't taken lately?     Plan: Educated/demonstrated how to use theracane A. Curved ball for general back  B. Short ball is for pecs, back of neck and upper traps and top of shoulders  C. Long ball is for side of neck/scalenes and levators- corner of neck and shoulder.   D. Tennis ball- for back of thighs or buttocks- hold 5 or more minutes.   2. Will refill Gabapentin 300 mg BID with 11 refills and Tramadol 50 mg q6 hours prn- #120 - still had 2 refills left- is due- sent in both  3. Patient here for trigger point injections for  Consent done and on chart.  Cleaned areas with alcohol and injected using a 27 gauge 1.5 inch needle  Injected 4cc Using 1% Lidocaine with no EPI  Upper traps B/L Levators R only Posterior scalenes R only Middle scalenes Splenius Capitus Pectoralis Major tight but didn't inject Rhomboids Infraspinatus Teres Major/minor Thoracic paraspinals B/L  Lumbar paraspinals Other injections-  B/L flexor part of forearms B/L    Patient's level of pain prior was 5/10 Current level of pain after injections is 4/10  There was no bleeding or complications.  Patient was advised to drink a lot of water on day after injections to flush system Will have increased soreness for 12-48 hours after injections.  Can use Lidocaine patches the day AFTER injections Can use theracane on day of injections in places didn't inject Can use heating pad 4-6 hours AFTER injections  4. Pectoralis stretch- stand in open  doorway and put arms at 90 degrees against doorway and push chest forward and pull a little with arms. And hold stretch 90-120 seconds  Can work arms up and do stretch again if need be.   5. Discussed fostering/adopting a small child- just know less sleep won't help pain issues .  6. F/U 3 months- trigger point injections.   7. Will send to PT- [facility]- for myofascial pain/Fibromyalgia. Placed order.   8. Let me know if there's any refills at [facility] for Low dose Naltrexone. Not sure dose? If not, wil restart at 3 mg daily.   I spent a total of 35 minutes on visit- 10 minutes on injections and 25 minutes on visit- as detailed above.

## 2020-10-03 ENCOUNTER — Other Ambulatory Visit: Payer: Self-pay | Admitting: Obstetrics and Gynecology

## 2020-10-03 DIAGNOSIS — Z1231 Encounter for screening mammogram for malignant neoplasm of breast: Secondary | ICD-10-CM

## 2020-10-24 ENCOUNTER — Other Ambulatory Visit (INDEPENDENT_AMBULATORY_CARE_PROVIDER_SITE_OTHER): Payer: Self-pay | Admitting: Family Medicine

## 2020-10-24 DIAGNOSIS — R7301 Impaired fasting glucose: Secondary | ICD-10-CM

## 2020-10-25 ENCOUNTER — Ambulatory Visit (INDEPENDENT_AMBULATORY_CARE_PROVIDER_SITE_OTHER): Payer: 59 | Admitting: Family Medicine

## 2020-10-31 ENCOUNTER — Ambulatory Visit (INDEPENDENT_AMBULATORY_CARE_PROVIDER_SITE_OTHER): Payer: 59 | Admitting: Physician Assistant

## 2020-11-01 ENCOUNTER — Other Ambulatory Visit: Payer: Self-pay

## 2020-11-01 ENCOUNTER — Ambulatory Visit (INDEPENDENT_AMBULATORY_CARE_PROVIDER_SITE_OTHER): Payer: 59 | Admitting: Family Medicine

## 2020-11-01 ENCOUNTER — Encounter (INDEPENDENT_AMBULATORY_CARE_PROVIDER_SITE_OTHER): Payer: Self-pay | Admitting: Family Medicine

## 2020-11-01 VITALS — BP 117/82 | HR 108 | Temp 97.7°F | Ht 64.0 in | Wt 197.0 lb

## 2020-11-01 DIAGNOSIS — E8881 Metabolic syndrome: Secondary | ICD-10-CM | POA: Diagnosis not present

## 2020-11-01 DIAGNOSIS — Z6834 Body mass index (BMI) 34.0-34.9, adult: Secondary | ICD-10-CM

## 2020-11-01 DIAGNOSIS — E88819 Insulin resistance, unspecified: Secondary | ICD-10-CM

## 2020-11-01 DIAGNOSIS — K760 Fatty (change of) liver, not elsewhere classified: Secondary | ICD-10-CM

## 2020-11-01 DIAGNOSIS — E669 Obesity, unspecified: Secondary | ICD-10-CM | POA: Diagnosis not present

## 2020-11-02 ENCOUNTER — Encounter (INDEPENDENT_AMBULATORY_CARE_PROVIDER_SITE_OTHER): Payer: Self-pay

## 2020-11-02 MED ORDER — TIRZEPATIDE 5 MG/0.5ML ~~LOC~~ SOAJ: 5.0000 mg | SUBCUTANEOUS | 0 refills | Status: DC

## 2020-11-02 NOTE — Progress Notes (Signed)
Chief Complaint:   OBESITY [name] is here to discuss her progress with her obesity treatment plan along with follow-up of her obesity related diagnoses.   Today's visit was #: 11 Starting weight: 201 lbs Starting date: 01/03/2020 Today's weight: 197 lbs Today's date: 11/01/2020 Weight change since last visit: 2 lbs Total lbs lost to date: 4 lbs Body mass index is 33.81 kg/m.  Total weight loss percentage to date: -1.99%  Current Meal Plan: following a lower carbohydrate, vegetable and lean protein rich diet plan for 60% of the time.  Current Exercise Plan: Walking 1 mile 3 times per week. Current Anti-Obesity Medications: Ozempic 0.5 mg subcutaneously weekly. Side effects: None.  Interim History:  [name] has been on Ozempic for 4 weeks.  Denies polyphagia.  Assessment/Plan:   1. Insulin resistance, with polyphagia Not at goal. Goal is HgbA1c < 5.7, fasting insulin closer to 5.  Medication: Ozempic 1 mg subcutaneously weekly.    Plan:  Stop Ozempic and start Mounjaro 5 mg subcutaneously weekly.  She will continue to focus on protein-rich, low simple carbohydrate foods. We reviewed the importance of hydration, regular exercise for stress reduction, and restorative sleep.   Lab Results  Component Value Date   HGBA1C 5.1 01/03/2020   Lab Results  Component Value Date   INSULIN 11.6 01/03/2020   - Start tirzepatide [facility]) 5 MG/0.5ML Pen; Inject 5 mg into the skin once a week.  Dispense: 2 mL; Refill: 0  2. NAFLD (nonalcoholic fatty liver disease) NAFLD is an umbrella term that encompasses a disease spectrum that includes steatosis (fat) without inflammation, steatohepatitis (NASH; fat + inflammation in a characteristic pattern), and cirrhosis. Bland steatosis is felt to be a benign condition, with extremely low to no risk of progression to cirrhosis, whereas NASH can progress to cirrhosis. The mainstay of treatment of NAFLD includes lifestyle modification to achieve weight  loss, at least 7% of current body weight. Low carbohydrate diets can be beneficial in improving NAFLD liver histology. Additionally, exercise, even the absence of weight loss can have beneficial effects on the patient's metabolic profile and liver health.   3. Obesity, current BMI 33.9  Course: [name] is currently in the action stage of change. As such, her goal is to continue with weight loss efforts.   Nutrition goals: She has agreed to following a lower carbohydrate, vegetable and lean protein rich diet plan.   Exercise goals:  As is.  Behavioral modification strategies: increasing lean protein intake, decreasing simple carbohydrates, increasing vegetables, and increasing water intake.  [name] has agreed to follow-up with our clinic in 4 weeks. She was informed of the importance of frequent follow-up visits to maximize her success with intensive lifestyle modifications for her multiple health conditions.   Objective:   Blood pressure 117/82, pulse (!) 108, temperature 97.7 F (36.5 C), temperature source Oral, height '5\' 4"'$  (1.626 m), weight 197 lb (89.4 kg), last menstrual period 01/22/2011, SpO2 97 %. Body mass index is 33.81 kg/m.  General: Cooperative, alert, well developed, in no acute distress. HEENT: Conjunctivae and lids unremarkable. Cardiovascular: Regular rhythm.  Lungs: Normal work of breathing. Neurologic: No focal deficits.   Lab Results  Component Value Date   CREATININE 1.04 (H) 04/19/2020   BUN 19 04/19/2020   NA 140 04/19/2020   K 3.7 04/19/2020   CL 104 04/19/2020   CO2 24 04/19/2020   Lab Results  Component Value Date   ALT 56 (H) 04/19/2020   AST 37 04/19/2020  ALKPHOS 61 04/19/2020   BILITOT 0.7 04/19/2020   Lab Results  Component Value Date   HGBA1C 5.1 01/03/2020   Lab Results  Component Value Date   INSULIN 11.6 01/03/2020   Lab Results  Component Value Date   TSH 3.920 01/03/2020   Lab Results  Component Value Date   CHOL 198  01/03/2020   HDL 52 01/03/2020   LDLCALC 126 (H) 01/03/2020   TRIG 114 01/03/2020   Lab Results  Component Value Date   VD25OH 35.0 01/03/2020   Lab Results  Component Value Date   WBC 5.9 04/19/2020   HGB 14.8 04/19/2020   HCT 42.5 04/19/2020   MCV 88.5 04/19/2020   PLT 207 04/19/2020   Attestation Statements:   Reviewed by clinician on day of visit: allergies, medications, problem list, medical history, surgical history, family history, social history, and previous encounter notes.  I, Water quality scientist, CMA, am acting as transcriptionist for Briscoe Deutscher, DO  I have reviewed the above documentation for accuracy and completeness, and I agree with the above. Briscoe Deutscher, DO

## 2020-11-22 ENCOUNTER — Ambulatory Visit: Payer: 59

## 2020-12-01 ENCOUNTER — Other Ambulatory Visit (INDEPENDENT_AMBULATORY_CARE_PROVIDER_SITE_OTHER): Payer: Self-pay | Admitting: Family Medicine

## 2020-12-01 DIAGNOSIS — E8881 Metabolic syndrome: Secondary | ICD-10-CM

## 2020-12-04 ENCOUNTER — Encounter (INDEPENDENT_AMBULATORY_CARE_PROVIDER_SITE_OTHER): Payer: Self-pay

## 2020-12-04 ENCOUNTER — Ambulatory Visit: Payer: 59 | Attending: Physical Medicine and Rehabilitation

## 2020-12-04 ENCOUNTER — Other Ambulatory Visit: Payer: Self-pay

## 2020-12-04 DIAGNOSIS — M797 Fibromyalgia: Secondary | ICD-10-CM | POA: Diagnosis not present

## 2020-12-04 DIAGNOSIS — M6281 Muscle weakness (generalized): Secondary | ICD-10-CM | POA: Diagnosis not present

## 2020-12-04 DIAGNOSIS — R252 Cramp and spasm: Secondary | ICD-10-CM | POA: Diagnosis not present

## 2020-12-04 DIAGNOSIS — R293 Abnormal posture: Secondary | ICD-10-CM | POA: Diagnosis not present

## 2020-12-04 NOTE — Therapy (Signed)
[facility] @ [place], [place], [address] Phone: [phone]   Fax:  [phone]  Physical Therapy Evaluation  Patient Details  Name: [name] MRN: [id number] Date of Birth: 02-25-1961 Referring Provider (PT): Courtney Heys, MD   Encounter Date: 12/04/2020   PT End of Session - 12/04/20 1006     Visit Number 1    Date for PT Re-Evaluation 01/29/21    Authorization Type UHC    PT Start Time 0932    PT Stop Time 1010    PT Time Calculation (min) 38 min    Activity Tolerance Patient tolerated treatment well    Behavior During Therapy [facility] for tasks assessed/performed             Past Medical History:  Diagnosis Date   Abnormal heart rate    Abnormal uterine bleeding (AUB)    Allergic rhinitis    Anemia    Anxiety    Asthma    B12 deficiency    Bruises easily    Cat-scratch disease    h/o   Chest pain    Chronic back pain    oa   Chronic fatigue syndrome    CMV (cytomegalovirus) ([place])    h/o   Constipation    Degenerative disc disease, cervical    and lumbar   Depression    Fibrocystic breast disease    Fibroids    Fibromyalgia    GERD (gastroesophageal reflux disease)    watches diet   H/O hiatal hernia    H/O myomectomy    Hashimoto thyroiditis    History of babesiosis    PARASITITIS RESOLVED   History of Bell's palsy    2000--  resolved   History of concussion    age 76--  no residual   History of hyperparathyroidism    PRIMARY--  S/P PARATHYROIDECTOMY 01-28-2011   History of palpitations    stress induced   History of stomach ulcers    History of uterine fibroid    Hypoglycemia    Hypothyroidism    Joint pain    Kidney disease    3A   Lyme disease    CHRONIC --  FOR 22 YRS (SINCE APPROX 1996)  AND HX CAT SCRATCH DISEASE WHICH HAS RESOLVED   Musculoskeletal chest pain    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   Osteoarthritis    all joints secondary to lyme disease/   back is worse   Palpitations    SOB (shortness of breath)    Spinal stenosis    Swallowing difficulty    Vitamin B12 deficiency    Vitamin D deficiency    Wears contact lenses     Past Surgical History:  Procedure Laterality Date   COLONOSCOPY  12/2017   DILATATION & CURRETTAGE/HYSTEROSCOPY WITH RESECTOCOPE N/A 12/02/2012   Procedure: DILATATION & CURETTAGE/HYSTEROSCOPY WITH RESECTOCOPE;  Surgeon: Darlyn Chamber, MD;  Location: [facility];  Service: Gynecology;  Laterality: N/A;   GANGLION CYST EXCISION  2007   HAND   HERNIA REPAIR  2010   Exploratory   HYSTEROSCOPIC MYOMECTOMY AND ABDOMINAL MYOMECTOMY  2006   PARATHYROIDECTOMY  01/28/2011   Procedure: PARATHYROIDECTOMY;  Surgeon: Earnstine Regal, MD;  Location: [facility];  Service: General;  Laterality: N/A;    STRESS ECHO REPORT  09-01-2009  DR Martinique   NORMAL LVF/  EF 60%/  NORMAL HYPERDYNAMIC RESPONSE   TILT TABLE STUDY  08-21-2011  DR KLEIN   BP WERE CONSISTENT 110-117 THROUGH OUT STUDY/ BASELINE HR 68-70   TILT TABLE STUDY N/A 08/21/2011   Procedure: TILT TABLE STUDY;  Surgeon: Deboraha Sprang, MD;  Location: [facility] CATH LAB;  Service: Cardiovascular;  Laterality: N/A;   TONSILLECTOMY  age 60    There were no vitals filed for this visit.    Subjective Assessment - 12/04/20 0934     Subjective Pt presents to PT with widespread pain with LBP being the worst.  Pt with history of  fibromyalgia and Lyme disease.  Pt has received 2 infusions for Lyme disease.    Pertinent History depression, GERD, OA, stenosis, Lyme disease (chronic), pt receives fibro injections    Limitations Walking;Sitting    How long can you sit comfortably? 3 hours max    How long can you walk comfortably? 40 min max    Diagnostic tests x-ray: DDD and stenosis per pt report    Patient Stated Goals reduce LBP    Currently in Pain? Yes    Pain Score 3    7-8/10 max   Pain Location Thoracic    Pain Orientation Right;Left;Lower    Pain Descriptors  / Indicators Aching;Constant    Pain Type Chronic pain    Pain Onset More than a month ago    Pain Frequency Constant    Aggravating Factors  sitting, walking, bending over    Pain Relieving Factors pain medication, tramadol                [facility] PT Assessment - 12/04/20 0001       Assessment   Medical Diagnosis myofascial pain, fibromyalgia    Referring Provider (PT) Courtney Heys, MD    Onset Date/Surgical Date --   chronic   Next MD Visit November    Prior Therapy none recent      Precautions   Precautions None      Restrictions   Weight Bearing Restrictions No      Balance Screen   Has the patient fallen in the past 6 months No    Has the patient had a decrease in activity level because of a fear of falling?  No    Is the patient reluctant to leave their home because of a fear of falling?  No      Home Ecologist residence    Living Arrangements Spouse/significant other      Prior Function   Level of Independence Independent with basic ADLs    Vocation On disability      Cognition   Overall Cognitive Status Within Functional Limits for tasks assessed      Observation/Other Assessments   Focus on Therapeutic Outcomes (FOTO)  51 (goal is 60)      Posture/Postural Control   Posture/Postural Control Postural limitations    Postural Limitations Rounded Shoulders;Forward head;Flexed trunk    Posture Comments elevated shoulder s      ROM / Strength   AROM / PROM / Strength PROM;AROM;Strength      AROM   Overall AROM  Within functional limits for tasks performed    Overall AROM Comments all A/ROM is WFLS with pain and stiffness reported at end range      Strength   Overall Strength Deficits    Overall Strength Comments UEs 4/5, LEs 4+/5      Palpation   Spinal mobility global spinal hypomobility with minor pain reported    Palpation comment diffuse  palpable tenderness over bil upper traps, lumbar and thoracic paraspinals       Ambulation/Gait   Ambulation/Gait Yes    Ambulation/Gait Assistance 7: Independent                        Objective measurements completed on examination: See above findings.                PT Education - 12/04/20 1002     Education Details Access Code: [email or web address]    Person(s) Educated Patient    Methods Explanation;Demonstration;Handout    Comprehension Verbalized understanding;Returned demonstration              PT Short Term Goals - 12/04/20 0925       PT SHORT TERM GOAL #1   Title be independent in initial HEP    Time 4    Period Weeks    Status New    Target Date 01/01/21      PT SHORT TERM GOAL #2   Title report a 25% reduction in thoracic and lumbar pain with daily tasks    Time 4    Period Weeks    Status New    Target Date 01/01/21               PT Long Term Goals - 12/04/20 0925       PT LONG TERM GOAL #1   Title be independent in advanced HEP    Time 8    Period Weeks    Status New    Target Date 01/29/21      PT LONG TERM GOAL #2   Title report > or = to 40% reduction in thoracic and lumbar pain with home tasks and care of her child    Time 8    Period Weeks    Status New    Target Date 01/29/21      PT LONG TERM GOAL #3   Title verbalize and demonstrate body mechanics modifications for lumbar protection with daily tasks    Time 8    Period Weeks    Status New    Target Date 01/29/21      PT LONG TERM GOAL #4   Title improve FOTO to > 60 to improve function    Time 8    Period Weeks    Status New    Target Date 01/29/21                    Plan - 12/04/20 1016     Clinical Impression Statement Pt presents to PT with chronic widespread pain related to fibromyalgia and lyme disease.  Pt reports that her pain is more in the thoracic spine.  Pt rates thoracic pain as 3-8/10 and she is limited to walking 40 minutes and long periods of time sitting due to pain.  Pt would like to learn stretches  that she can perform at home in addition to walking for exercise.  Pt with forward head, rounded shoulders, flexed trunk and scapular elevation posture and requires tactile and verbal cues to correct.  Pt with diffuse tension in bil cervical and lumbar paraspinals, upper traps and thoracic paraspinals with reduced segmental mobility.  Pt will benefit from skilled PT to address body mechanics, posture, tissue mobility and flexibility and pain reduction.    Personal Factors and Comorbidities Comorbidity 2    Comorbidities fibromyalgia, lyme disease, spinal stenosis    Examination-Activity Limitations Lift;Sit;Locomotion Level  Examination-Participation Restrictions Shop;Meal Prep;Cleaning;Community Activity    Stability/Clinical Decision Making Evolving/Moderate complexity    Clinical Decision Making Moderate    Rehab Potential Good    PT Frequency 1x / week    PT Duration 8 weeks    PT Treatment/Interventions ADLs/Self Care Home Management;Cryotherapy;Electrical Stimulation;Moist Heat;Neuromuscular re-education;Therapeutic exercise;Therapeutic activities;Functional mobility training;Patient/family education;Manual techniques;Dry needling;Passive range of motion;Taping;Joint Manipulations;Spinal Manipulations    PT Next Visit Plan continue to build HEP for flexiblity, gentle postural strength    PT Home Exercise Plan Access Code: [email or web address]    Consulted and Agree with Plan of Care Patient             Patient will benefit from skilled therapeutic intervention in order to improve the following deficits and impairments:  Decreased activity tolerance, Postural dysfunction, Improper body mechanics, Impaired flexibility, Pain, Increased muscle spasms, Hypomobility, Decreased strength  Visit Diagnosis: Fibromyalgia - Plan: PT plan of care cert/re-cert  Cramp and spasm - Plan: PT plan of care cert/re-cert  Abnormal posture - Plan: PT plan of care cert/re-cert     Problem List Patient Active  Problem List   Diagnosis Date Noted   Vitamin D deficiency 02/07/2020   Insulin resistance 02/07/2020   Class 1 obesity with serious comorbidity and body mass index (BMI) of 34.0 to 34.9 in adult 02/07/2020   Myofascial pain 01/12/2020   Acute post-traumatic headache, not intractable 06/02/2018   Fibromyalgia 06/05/2016   Other insomnia 06/05/2016   Other fatigue 06/05/2016   DDD (degenerative disc disease), cervical 06/05/2016   DDD (degenerative disc disease), lumbar 06/05/2016   Osteopenia of multiple sites 06/05/2016   History of asthma 06/05/2016   History of Hashimoto thyroiditis 06/05/2016   History of anxiety 06/05/2016   Seasonal affective disorder ([place]) 06/05/2016   Postmenopausal bleeding 12/02/2012    Class: Present on Admission   Fibroids, submucosal 12/02/2012    Class: Present on Admission   Hx of Lyme disease 06/02/2012   Postural dizziness 08/08/2011   Hyperparathyroidism, primary () 01/08/2011   Sigurd Sos, PT 12/04/20 10:28 AM  [facility] @ [place], [place], [address] Phone: [phone]   Fax:  [phone]  Name: [name] MRN: [id number] Date of Birth: March 04, 1960

## 2020-12-04 NOTE — Patient Instructions (Signed)
Access Code: [email or web address] URL: [email or web address] Date: 12/04/2020 Prepared by: Claiborne Billings  Exercises Supine Lower Trunk Rotation - 3 x daily - 7 x weekly - 1 sets - 3 reps - 20 hold Hooklying Single Knee to Chest - 3 x daily - 7 x weekly - 1 sets - 3 reps - 20 hold Seated Hamstring Stretch - 3 x daily - 7 x weekly - 1 sets - 3 reps - 20 hold Seated Cervical Flexion AROM - 3 x daily - 7 x weekly - 1 sets - 3 reps - 20 hold Seated Cervical Sidebending AROM - 3 x daily - 7 x weekly - 1 sets - 3 reps - 20 hold Seated Cervical Rotation AROM - 3 x daily - 7 x weekly - 1 sets - 3 reps - 20 hold Seated Correct Posture - 1 x daily - 7 x weekly - 3 sets - 10 reps

## 2020-12-04 NOTE — Telephone Encounter (Signed)
Message sent to pt-CAS 

## 2020-12-06 ENCOUNTER — Telehealth (INDEPENDENT_AMBULATORY_CARE_PROVIDER_SITE_OTHER): Payer: Self-pay

## 2020-12-06 DIAGNOSIS — E8881 Metabolic syndrome: Secondary | ICD-10-CM

## 2020-12-06 MED ORDER — TIRZEPATIDE 5 MG/0.5ML ~~LOC~~ SOAJ: 5.0000 mg | SUBCUTANEOUS | 0 refills | Status: DC

## 2020-12-06 NOTE — Telephone Encounter (Signed)
RX sent

## 2020-12-06 NOTE — Telephone Encounter (Signed)
Pt called in for a medication refill on her Mounjaro sent to her pharmacy ( [facility] ). Please Advise

## 2020-12-11 ENCOUNTER — Encounter: Payer: Self-pay | Admitting: Rehabilitative and Restorative Service Providers"

## 2020-12-11 ENCOUNTER — Ambulatory Visit: Payer: 59 | Admitting: Rehabilitative and Restorative Service Providers"

## 2020-12-11 ENCOUNTER — Other Ambulatory Visit: Payer: Self-pay

## 2020-12-11 DIAGNOSIS — M25552 Pain in left hip: Secondary | ICD-10-CM

## 2020-12-11 DIAGNOSIS — R293 Abnormal posture: Secondary | ICD-10-CM | POA: Diagnosis not present

## 2020-12-11 DIAGNOSIS — R252 Cramp and spasm: Secondary | ICD-10-CM | POA: Diagnosis not present

## 2020-12-11 DIAGNOSIS — G8929 Other chronic pain: Secondary | ICD-10-CM

## 2020-12-11 DIAGNOSIS — M797 Fibromyalgia: Secondary | ICD-10-CM | POA: Diagnosis not present

## 2020-12-11 DIAGNOSIS — M545 Low back pain, unspecified: Secondary | ICD-10-CM

## 2020-12-11 DIAGNOSIS — R2689 Other abnormalities of gait and mobility: Secondary | ICD-10-CM

## 2020-12-11 DIAGNOSIS — M25551 Pain in right hip: Secondary | ICD-10-CM

## 2020-12-11 DIAGNOSIS — M6281 Muscle weakness (generalized): Secondary | ICD-10-CM | POA: Diagnosis not present

## 2020-12-11 NOTE — Therapy (Signed)
[facility] @ [place], [place], [address] Phone: [phone]   Fax:  [phone]  Physical Therapy Treatment  Patient Details  Name: [name] MRN: [id number] Date of Birth: [birth year] Referring Provider (PT): Courtney Heys, MD   Encounter Date: 12/11/2020   PT End of Session - 12/11/20 1106     Visit Number 2    Date for PT Re-Evaluation 01/29/21    Authorization Type UHC    PT Start Time 1100    PT Stop Time 1140    PT Time Calculation (min) 40 min    Activity Tolerance Patient tolerated treatment well    Behavior During Therapy WFL for tasks assessed/performed             Past Medical History:  Diagnosis Date   Abnormal heart rate    Abnormal uterine bleeding (AUB)    Allergic rhinitis    Anemia    Anxiety    Asthma    B12 deficiency    Bruises easily    Cat-scratch disease    h/o   Chest pain    Chronic back pain    oa   Chronic fatigue syndrome    CMV (cytomegalovirus) ([place])    h/o   Constipation    Degenerative disc disease, cervical    and lumbar   Depression    Fibrocystic breast disease    Fibroids    Fibromyalgia    GERD (gastroesophageal reflux disease)    watches diet   H/O hiatal hernia    H/O myomectomy    Hashimoto thyroiditis    History of babesiosis    PARASITITIS RESOLVED   History of Bell's palsy    2000--  resolved   History of concussion    age 60--  no residual   History of hyperparathyroidism    PRIMARY--  S/P PARATHYROIDECTOMY 01-28-2011   History of palpitations    stress induced   History of stomach ulcers    History of uterine fibroid    Hypoglycemia    Hypothyroidism    Joint pain    Kidney disease    3A   Lyme disease    CHRONIC --  FOR 22 YRS (SINCE APPROX 1996)  AND HX CAT SCRATCH DISEASE WHICH HAS RESOLVED   Musculoskeletal chest pain    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   Osteoarthritis    all joints secondary to lyme disease/   back is worse   Palpitations    SOB (shortness of breath)    Spinal stenosis    Swallowing difficulty    Vitamin B12 deficiency    Vitamin D deficiency    Wears contact lenses     Past Surgical History:  Procedure Laterality Date   COLONOSCOPY  12/2017   DILATATION & CURRETTAGE/HYSTEROSCOPY WITH RESECTOCOPE N/A 12/02/2012   Procedure: DILATATION & CURETTAGE/HYSTEROSCOPY WITH RESECTOCOPE;  Surgeon: Darlyn Chamber, MD;  Location: [facility];  Service: Gynecology;  Laterality: N/A;   GANGLION CYST EXCISION  2007   HAND   HERNIA REPAIR  2010   Exploratory   HYSTEROSCOPIC MYOMECTOMY AND ABDOMINAL MYOMECTOMY  2006   PARATHYROIDECTOMY  01/28/2011   Procedure: PARATHYROIDECTOMY;  Surgeon: Earnstine Regal, MD;  Location: [facility];  Service: General;  Laterality: N/A;    STRESS ECHO REPORT  09-01-2009  DR Martinique   NORMAL LVF/  EF 60%/  NORMAL HYPERDYNAMIC RESPONSE   TILT TABLE STUDY  08-21-2011  DR KLEIN   BP WERE CONSISTENT 110-117 THROUGH OUT STUDY/ BASELINE HR 68-70   TILT TABLE STUDY N/A 08/21/2011   Procedure: TILT TABLE STUDY;  Surgeon: Deboraha Sprang, MD;  Location: [facility] CATH LAB;  Service: Cardiovascular;  Laterality: N/A;   TONSILLECTOMY  age 60    There were no vitals filed for this visit.   Subjective Assessment - 12/11/20 1105     Subjective Pt admits to not perforning her HEP.    Patient Stated Goals reduce LBP    Currently in Pain? Yes    Pain Score 3     Pain Location Thoracic                               [facility] Adult PT Treatment/Exercise - 12/11/20 0001       Exercises   Exercises Knee/Hip      Knee/Hip Exercises: Stretches   Passive Hamstring Stretch Both;3 reps;20 seconds    Piriformis Stretch Both;3 reps;20 seconds    Other Knee/Hip Stretches lower trunk rotation 10 sec hold x5B.      Knee/Hip Exercises: Aerobic   Nustep L4 x6 min.  PT present to provide cuing for pacing      Manual Therapy   Manual Therapy Soft tissue  mobilization    Manual therapy comments in prone    Soft tissue mobilization myofascial rolling with suction cups to assist with decreased restrictions.  STM to throacic region.                       PT Short Term Goals - 12/11/20 1150       PT SHORT TERM GOAL #1   Title be independent in initial HEP    Status On-going      PT SHORT TERM GOAL #2   Title report a 25% reduction in thoracic and lumbar pain with daily tasks    Status On-going               PT Long Term Goals - 12/04/20 0925       PT LONG TERM GOAL #1   Title be independent in advanced HEP    Time 8    Period Weeks    Status New    Target Date 01/29/21      PT LONG TERM GOAL #2   Title report > or = to 40% reduction in thoracic and lumbar pain with home tasks and care of her child    Time 8    Period Weeks    Status New    Target Date 01/29/21      PT LONG TERM GOAL #3   Title verbalize and demonstrate body mechanics modifications for lumbar protection with daily tasks    Time 8    Period Weeks    Status New    Target Date 01/29/21      PT LONG TERM GOAL #4   Title improve FOTO to > 60 to improve function    Time 8    Period Weeks    Status New    Target Date 01/29/21                   Plan - 12/11/20 1145     Clinical Impression Statement Pt admits that she has not been compliance with HEP and stretching, educated pt on the importance of increased compliance. She does report some increased thoracic  pain today and reports decreased pain following manual therapy. Pt requires come cuing with stretching exercises for improved technique. Pt would benefit from continued core strengthening and stability, but wanted to focus on HEP stretching, as pt admited noncompliance.    PT Treatment/Interventions ADLs/Self Care Home Management;Cryotherapy;Electrical Stimulation;Moist Heat;Neuromuscular re-education;Therapeutic exercise;Therapeutic activities;Functional mobility  training;Patient/family education;Manual techniques;Dry needling;Passive range of motion;Taping;Joint Manipulations;Spinal Manipulations    PT Next Visit Plan continue to build HEP for flexiblity, gentle postural strength    Consulted and Agree with Plan of Care Patient             Patient will benefit from skilled therapeutic intervention in order to improve the following deficits and impairments:  Decreased activity tolerance, Postural dysfunction, Improper body mechanics, Impaired flexibility, Pain, Increased muscle spasms, Hypomobility, Decreased strength  Visit Diagnosis: Fibromyalgia  Cramp and spasm  Abnormal posture  Chronic bilateral low back pain without sciatica  Pain in left hip  Pain in right hip  Other abnormalities of gait and mobility     Problem List Patient Active Problem List   Diagnosis Date Noted   Vitamin D deficiency 02/07/2020   Insulin resistance 02/07/2020   Class 1 obesity with serious comorbidity and body mass index (BMI) of 34.0 to 34.9 in adult 02/07/2020   Myofascial pain 01/12/2020   Acute post-traumatic headache, not intractable 06/02/2018   Fibromyalgia 06/05/2016   Other insomnia 06/05/2016   Other fatigue 06/05/2016   DDD (degenerative disc disease), cervical 06/05/2016   DDD (degenerative disc disease), lumbar 06/05/2016   Osteopenia of multiple sites 06/05/2016   History of asthma 06/05/2016   History of Hashimoto thyroiditis 06/05/2016   History of anxiety 06/05/2016   Seasonal affective disorder ([place]) 06/05/2016   Postmenopausal bleeding 12/02/2012    Class: Present on Admission   Fibroids, submucosal 12/02/2012    Class: Present on Admission   Hx of Lyme disease 06/02/2012   Postural dizziness 08/08/2011   Hyperparathyroidism, primary ([place]) 01/08/2011    Juel Burrow, PT, DPT 12/11/2020, 11:52 AM  [facility] @ [place], [place],  [address] Phone: [phone]   Fax:  [phone]  Name: [name] MRN: [id number] Date of Birth: October 09, 1960

## 2020-12-12 ENCOUNTER — Ambulatory Visit (INDEPENDENT_AMBULATORY_CARE_PROVIDER_SITE_OTHER): Payer: 59 | Admitting: Family Medicine

## 2020-12-13 ENCOUNTER — Other Ambulatory Visit (INDEPENDENT_AMBULATORY_CARE_PROVIDER_SITE_OTHER): Payer: Self-pay | Admitting: Family Medicine

## 2020-12-13 DIAGNOSIS — E559 Vitamin D deficiency, unspecified: Secondary | ICD-10-CM

## 2020-12-18 ENCOUNTER — Other Ambulatory Visit: Payer: Self-pay

## 2020-12-18 ENCOUNTER — Ambulatory Visit: Payer: 59 | Admitting: Rehabilitative and Restorative Service Providers"

## 2020-12-18 ENCOUNTER — Encounter: Payer: Self-pay | Admitting: Rehabilitative and Restorative Service Providers"

## 2020-12-18 DIAGNOSIS — M6281 Muscle weakness (generalized): Secondary | ICD-10-CM | POA: Diagnosis not present

## 2020-12-18 DIAGNOSIS — G8929 Other chronic pain: Secondary | ICD-10-CM

## 2020-12-18 DIAGNOSIS — R293 Abnormal posture: Secondary | ICD-10-CM | POA: Diagnosis not present

## 2020-12-18 DIAGNOSIS — R252 Cramp and spasm: Secondary | ICD-10-CM | POA: Diagnosis not present

## 2020-12-18 DIAGNOSIS — R2689 Other abnormalities of gait and mobility: Secondary | ICD-10-CM

## 2020-12-18 DIAGNOSIS — M797 Fibromyalgia: Secondary | ICD-10-CM

## 2020-12-18 DIAGNOSIS — M25551 Pain in right hip: Secondary | ICD-10-CM

## 2020-12-18 DIAGNOSIS — M25552 Pain in left hip: Secondary | ICD-10-CM

## 2020-12-18 NOTE — Therapy (Signed)
[facility] @ [place], [place], [address] Phone: [phone]   Fax:  [phone]  Physical Therapy Treatment  Patient Details  Name: [name] MRN: [id number] Date of Birth: Jun 28, 1960 Referring Provider (PT): Courtney Heys, MD   Encounter Date: 12/18/2020   PT End of Session - 12/18/20 1106     Visit Number 3    Date for PT Re-Evaluation 01/29/21    Authorization Type UHC    PT Start Time 1100    PT Stop Time 1140    PT Time Calculation (min) 40 min    Activity Tolerance Patient tolerated treatment well    Behavior During Therapy WFL for tasks assessed/performed             Past Medical History:  Diagnosis Date   Abnormal heart rate    Abnormal uterine bleeding (AUB)    Allergic rhinitis    Anemia    Anxiety    Asthma    B12 deficiency    Bruises easily    Cat-scratch disease    h/o   Chest pain    Chronic back pain    oa   Chronic fatigue syndrome    CMV (cytomegalovirus) ([place])    h/o   Constipation    Degenerative disc disease, cervical    and lumbar   Depression    Fibrocystic breast disease    Fibroids    Fibromyalgia    GERD (gastroesophageal reflux disease)    watches diet   H/O hiatal hernia    H/O myomectomy    Hashimoto thyroiditis    History of babesiosis    PARASITITIS RESOLVED   History of Bell's palsy    2000--  resolved   History of concussion    age 42--  no residual   History of hyperparathyroidism    PRIMARY--  S/P PARATHYROIDECTOMY 01-28-2011   History of palpitations    stress induced   History of stomach ulcers    History of uterine fibroid    Hypoglycemia    Hypothyroidism    Joint pain    Kidney disease    3A   Lyme disease    CHRONIC --  FOR 22 YRS (SINCE APPROX 1996)  AND HX CAT SCRATCH DISEASE WHICH HAS RESOLVED   Musculoskeletal chest pain    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   Osteoarthritis    all joints secondary to lyme disease/   back is worse   Palpitations    SOB (shortness of breath)    Spinal stenosis    Swallowing difficulty    Vitamin B12 deficiency    Vitamin D deficiency    Wears contact lenses     Past Surgical History:  Procedure Laterality Date   COLONOSCOPY  12/2017   DILATATION & CURRETTAGE/HYSTEROSCOPY WITH RESECTOCOPE N/A 12/02/2012   Procedure: DILATATION & CURETTAGE/HYSTEROSCOPY WITH RESECTOCOPE;  Surgeon: Darlyn Chamber, MD;  Location: [facility];  Service: Gynecology;  Laterality: N/A;   GANGLION CYST EXCISION  2007   HAND   HERNIA REPAIR  2010   Exploratory   HYSTEROSCOPIC MYOMECTOMY AND ABDOMINAL MYOMECTOMY  2006   PARATHYROIDECTOMY  01/28/2011   Procedure: PARATHYROIDECTOMY;  Surgeon: Earnstine Regal, MD;  Location: [facility];  Service: General;  Laterality: N/A;    STRESS ECHO REPORT  09-01-2009  DR Martinique   NORMAL LVF/  EF 60%/  NORMAL HYPERDYNAMIC RESPONSE   TILT TABLE STUDY  08-21-2011  DR KLEIN   BP WERE CONSISTENT 110-117 THROUGH OUT STUDY/ BASELINE HR 68-70   TILT TABLE STUDY N/A 08/21/2011   Procedure: TILT TABLE STUDY;  Surgeon: Deboraha Sprang, MD;  Location: [facility] CATH LAB;  Service: Cardiovascular;  Laterality: N/A;   TONSILLECTOMY  age 57    There were no vitals filed for this visit.   Subjective Assessment - 12/18/20 1104     Subjective Pt continues to report noncompliance with HEP.  Pt does state that her back is feeling better after manual therapy last session stating "you got a significant knot out of my back".    Patient Stated Goals reduce LBP    Currently in Pain? Yes    Pain Score 4     Pain Location Back    Pain Orientation Lower    Pain Descriptors / Indicators Aching                               [facility] Adult PT Treatment/Exercise - 12/18/20 0001       Knee/Hip Exercises: Stretches   Passive Hamstring Stretch Both;3 reps;20 seconds    Piriformis Stretch Both;3 reps;20 seconds    Other Knee/Hip Stretches lower trunk  rotation 10 sec hold x5B.      Knee/Hip Exercises: Aerobic   Nustep L4 x6 min.  PT present to provide cuing for pacing      Knee/Hip Exercises: Machines for Strengthening   Cybex Leg Press 50# 2x10    Other Machine Rows 20# 2x10      Knee/Hip Exercises: Supine   Bridges Strengthening;Both;2 sets;10 reps    Bridges Limitations on red pball    Other Supine Knee/Hip Exercises with red pball:  DKTC, oblique knee to chest.  x10 each      Knee/Hip Exercises: Prone   Hip Extension Strengthening;Both;2 sets;10 reps                       PT Short Term Goals - 12/18/20 1142       PT SHORT TERM GOAL #1   Title be independent in initial HEP    Status On-going      PT SHORT TERM GOAL #2   Title report a 25% reduction in thoracic and lumbar pain with daily tasks    Status Partially Met               PT Long Term Goals - 12/18/20 1143       PT LONG TERM GOAL #1   Title be independent in advanced HEP    Status On-going      PT LONG TERM GOAL #2   Title report > or = to 40% reduction in thoracic and lumbar pain with home tasks and care of her child    Status On-going      PT LONG TERM GOAL #3   Title verbalize and demonstrate body mechanics modifications for lumbar protection with daily tasks    Status On-going      PT LONG TERM GOAL #4   Title improve FOTO to > 60 to improve function    Status On-going                   Plan - 12/18/20 1140     Clinical Impression Statement Pt continues with noncompliance with HEP, educated on the importance of performing HEP at home, especially with pt only coming once per  week to PT. Pt does state that her thoracic region is feeling better following manual therapy last session and states that the pain has moved some down to her lumbar region. Pt with no complaints of increased pain with addition of new strengthening and core stability exercises.    PT Treatment/Interventions ADLs/Self Care Home  Management;Cryotherapy;Electrical Stimulation;Moist Heat;Neuromuscular re-education;Therapeutic exercise;Therapeutic activities;Functional mobility training;Patient/family education;Manual techniques;Dry needling;Passive range of motion;Taping;Joint Manipulations;Spinal Manipulations    PT Next Visit Plan continue to build HEP for flexiblity, gentle postural strength    Consulted and Agree with Plan of Care Patient             Patient will benefit from skilled therapeutic intervention in order to improve the following deficits and impairments:  Decreased activity tolerance, Postural dysfunction, Improper body mechanics, Impaired flexibility, Pain, Increased muscle spasms, Hypomobility, Decreased strength  Visit Diagnosis: Fibromyalgia  Abnormal posture  Chronic bilateral low back pain without sciatica  Pain in left hip  Pain in right hip  Other abnormalities of gait and mobility     Problem List Patient Active Problem List   Diagnosis Date Noted   Vitamin D deficiency 02/07/2020   Insulin resistance 02/07/2020   Class 1 obesity with serious comorbidity and body mass index (BMI) of 34.0 to 34.9 in adult 02/07/2020   Myofascial pain 01/12/2020   Acute post-traumatic headache, not intractable 06/02/2018   Fibromyalgia 06/05/2016   Other insomnia 06/05/2016   Other fatigue 06/05/2016   DDD (degenerative disc disease), cervical 06/05/2016   DDD (degenerative disc disease), lumbar 06/05/2016   Osteopenia of multiple sites 06/05/2016   History of asthma 06/05/2016   History of Hashimoto thyroiditis 06/05/2016   History of anxiety 06/05/2016   Seasonal affective disorder ([place]) 06/05/2016   Postmenopausal bleeding 12/02/2012    Class: Present on Admission   Fibroids, submucosal 12/02/2012    Class: Present on Admission   Hx of Lyme disease 06/02/2012   Postural dizziness 08/08/2011   Hyperparathyroidism, primary ([place]) 01/08/2011    Juel Burrow, PT, DPT 12/18/2020,  11:44 AM  [facility] @ [place], [place], [address] Phone: [phone]   Fax:  [phone]  Name: LUTRICIA WIDJAJA MRN: [id number] Date of Birth: [birth year]

## 2020-12-19 ENCOUNTER — Encounter (INDEPENDENT_AMBULATORY_CARE_PROVIDER_SITE_OTHER): Payer: Self-pay | Admitting: Family Medicine

## 2020-12-19 ENCOUNTER — Ambulatory Visit (INDEPENDENT_AMBULATORY_CARE_PROVIDER_SITE_OTHER): Payer: 59 | Admitting: Family Medicine

## 2020-12-19 ENCOUNTER — Ambulatory Visit
Admission: RE | Admit: 2020-12-19 | Discharge: 2020-12-19 | Disposition: A | Discharge status: Home / Self Care | Payer: 59 | Source: Ambulatory Visit | Attending: Obstetrics and Gynecology | Admitting: Obstetrics and Gynecology

## 2020-12-19 VITALS — BP 111/75 | HR 90 | Temp 97.9°F | Ht 64.0 in | Wt 189.0 lb

## 2020-12-19 DIAGNOSIS — E669 Obesity, unspecified: Secondary | ICD-10-CM

## 2020-12-19 DIAGNOSIS — M797 Fibromyalgia: Secondary | ICD-10-CM | POA: Diagnosis not present

## 2020-12-19 DIAGNOSIS — E8881 Metabolic syndrome: Secondary | ICD-10-CM | POA: Diagnosis not present

## 2020-12-19 DIAGNOSIS — K760 Fatty (change of) liver, not elsewhere classified: Secondary | ICD-10-CM | POA: Diagnosis not present

## 2020-12-19 DIAGNOSIS — Z1231 Encounter for screening mammogram for malignant neoplasm of breast: Secondary | ICD-10-CM

## 2020-12-19 DIAGNOSIS — Z6834 Body mass index (BMI) 34.0-34.9, adult: Secondary | ICD-10-CM

## 2020-12-19 MED ORDER — TIRZEPATIDE 5 MG/0.5ML ~~LOC~~ SOAJ: 5.0000 mg | SUBCUTANEOUS | 0 refills | Status: DC

## 2020-12-21 NOTE — Progress Notes (Signed)
Chief Complaint:   OBESITY [name] is here to discuss her progress with her obesity treatment plan along with follow-up of her obesity related diagnoses. See Medical Weight Management Flowsheet for complete bioelectrical impedance results.  Today's visit was #: 12 Starting weight: 201 lbs Starting date: 01/03/2020 Weight change since last visit: 8 lbs Total lbs lost to date: 12 lbs Total weight loss percentage to date: -5.97%  Nutrition Plan: Lower carbohydrate, vegetable and lean protein rich diet plan for 50 of the time. Activity: Walking for 30 minutes 2-3 times per week.  Anti-obesity medications: Mounjaro 5 mg subcutaneously weekly. Reported side effects: None.  Interim History: [name] says she is now in size 12 Levi's.   She is taking Valtrex for EBV.  She sees Functional Medicine (in [place] every 3 months via phone visit) - Dr. Tona Sensing. She is in PT for fibromyalgia.  Assessment/Plan:   1. NAFLD (nonalcoholic fatty liver disease) The mainstay of treatment of NAFLD includes lifestyle modification to achieve weight loss, at least 7% of current body weight. Low carbohydrate diets can be beneficial in improving NAFLD liver histology. Additionally, exercise, even the absence of weight loss can have beneficial effects on the patient's metabolic profile and liver health.  2. Insulin resistance, with polyphagia Goal is HgbA1c < 5.7, fasting insulin closer to 5.  Medication: Mounjaro 5 mg subcutaneously weekly.    Plan:  She will continue to focus on protein-rich, low simple carbohydrate foods. We reviewed the importance of hydration, regular exercise for stress reduction, and restorative sleep.   Lab Results  Component Value Date   HGBA1C 5.1 01/03/2020   Lab Results  Component Value Date   INSULIN 11.6 01/03/2020   - Refill tirzepatide (MOUNJARO) 5 MG/0.5ML Pen; Inject 5 mg into the skin once a week.  Dispense: 2 mL; Refill: 0  3. Fibromyalgia Gloristine is currently in PT.     Intensive lifestyle modifications are the first line treatment for this issue. We discussed several lifestyle modifications today and she will continue to work on diet, exercise and weight loss efforts.We will continue to monitor.   4. Obesity, current BMI 32.4  Course: [name] is currently in the action stage of change. As such, her goal is to continue with weight loss efforts.   Nutrition goals: She has agreed to following a lower carbohydrate, vegetable and lean protein rich diet plan.   Exercise goals:  As is.  Behavioral modification strategies: increasing lean protein intake and decreasing simple carbohydrates.  [name] has agreed to follow-up with our clinic in 4 weeks. She was informed of the importance of frequent follow-up visits to maximize her success with intensive lifestyle modifications for her multiple health conditions.   Objective:   Blood pressure 111/75, pulse 90, temperature 97.9 F (36.6 C), temperature source Oral, height 5\' 4"  (1.626 m), weight 189 lb (85.7 kg), last menstrual period 01/22/2011, SpO2 96 %. Body mass index is 32.44 kg/m.  General: Cooperative, alert, well developed, in no acute distress. HEENT: Conjunctivae and lids unremarkable. Cardiovascular: Regular rhythm.  Lungs: Normal work of breathing. Neurologic: No focal deficits.   Lab Results  Component Value Date   CREATININE 1.04 (H) 04/19/2020   BUN 19 04/19/2020   NA 140 04/19/2020   K 3.7 04/19/2020   CL 104 04/19/2020   CO2 24 04/19/2020   Lab Results  Component Value Date   ALT 56 (H) 04/19/2020   AST 37 04/19/2020   ALKPHOS 61 04/19/2020   BILITOT 0.7  04/19/2020   Lab Results  Component Value Date   HGBA1C 5.1 01/03/2020   Lab Results  Component Value Date   INSULIN 11.6 01/03/2020   Lab Results  Component Value Date   TSH 3.920 01/03/2020   Lab Results  Component Value Date   CHOL 198 01/03/2020   HDL 52 01/03/2020   LDLCALC 126 (H) 01/03/2020   TRIG 114  01/03/2020   Lab Results  Component Value Date   VD25OH 35.0 01/03/2020   Lab Results  Component Value Date   WBC 5.9 04/19/2020   HGB 14.8 04/19/2020   HCT 42.5 04/19/2020   MCV 88.5 04/19/2020   PLT 207 04/19/2020   Attestation Statements:   Reviewed by clinician on day of visit: allergies, medications, problem list, medical history, surgical history, family history, social history, and previous encounter notes.  I, Water quality scientist, CMA, am acting as transcriptionist for Briscoe Deutscher, DO  I have reviewed the above documentation for accuracy and completeness, and I agree with the above. -  Briscoe Deutscher, DO, MS, FAAFP, DABOM - Family and Bariatric Medicine.

## 2020-12-25 ENCOUNTER — Encounter: Payer: Self-pay | Admitting: Rehabilitative and Restorative Service Providers"

## 2020-12-25 ENCOUNTER — Other Ambulatory Visit: Payer: Self-pay

## 2020-12-25 ENCOUNTER — Ambulatory Visit: Payer: 59 | Admitting: Rehabilitative and Restorative Service Providers"

## 2020-12-25 DIAGNOSIS — M797 Fibromyalgia: Secondary | ICD-10-CM | POA: Diagnosis not present

## 2020-12-25 DIAGNOSIS — R293 Abnormal posture: Secondary | ICD-10-CM | POA: Diagnosis not present

## 2020-12-25 DIAGNOSIS — M6281 Muscle weakness (generalized): Secondary | ICD-10-CM | POA: Diagnosis not present

## 2020-12-25 DIAGNOSIS — R2689 Other abnormalities of gait and mobility: Secondary | ICD-10-CM

## 2020-12-25 DIAGNOSIS — M25551 Pain in right hip: Secondary | ICD-10-CM

## 2020-12-25 DIAGNOSIS — M545 Low back pain, unspecified: Secondary | ICD-10-CM

## 2020-12-25 DIAGNOSIS — G8929 Other chronic pain: Secondary | ICD-10-CM

## 2020-12-25 DIAGNOSIS — M25552 Pain in left hip: Secondary | ICD-10-CM

## 2020-12-25 DIAGNOSIS — R252 Cramp and spasm: Secondary | ICD-10-CM | POA: Diagnosis not present

## 2020-12-25 NOTE — Therapy (Signed)
[facility] @ [place], [place], [address] Phone: [phone]   Fax:  [phone]  Physical Therapy Treatment  Patient Details  Name: [name] MRN: [id number] Date of Birth: [birth year] Referring Provider (PT): Courtney Heys, MD   Encounter Date: 12/25/2020   PT End of Session - 12/25/20 1104     Visit Number 4    Date for PT Re-Evaluation 01/29/21    Authorization Type UHC    PT Start Time 1056    PT Stop Time 5852    PT Time Calculation (min) 39 min    Activity Tolerance Patient tolerated treatment well    Behavior During Therapy [facility] for tasks assessed/performed             Past Medical History:  Diagnosis Date   Abnormal heart rate    Abnormal uterine bleeding (AUB)    Allergic rhinitis    Anemia    Anxiety    Asthma    B12 deficiency    Bruises easily    Cat-scratch disease    h/o   Chest pain    Chronic back pain    oa   Chronic fatigue syndrome    CMV (cytomegalovirus) ([place])    h/o   Constipation    Degenerative disc disease, cervical    and lumbar   Depression    Fibrocystic breast disease    Fibroids    Fibromyalgia    GERD (gastroesophageal reflux disease)    watches diet   H/O hiatal hernia    H/O myomectomy    Hashimoto thyroiditis    History of babesiosis    PARASITITIS RESOLVED   History of Bell's palsy    2000--  resolved   History of concussion    age 23--  no residual   History of hyperparathyroidism    PRIMARY--  S/P PARATHYROIDECTOMY 01-28-2011   History of palpitations    stress induced   History of stomach ulcers    History of uterine fibroid    Hypoglycemia    Hypothyroidism    Joint pain    Kidney disease    3A   Lyme disease    CHRONIC --  FOR 22 YRS (SINCE APPROX 1996)  AND HX CAT SCRATCH DISEASE WHICH HAS RESOLVED   Musculoskeletal chest pain    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   Osteoarthritis    all joints secondary to lyme disease/  back  is worse   Palpitations    SOB (shortness of breath)    Spinal stenosis    Swallowing difficulty    Vitamin B12 deficiency    Vitamin D deficiency    Wears contact lenses     Past Surgical History:  Procedure Laterality Date   BREAST BIOPSY Right 12/17/2018   benign   COLONOSCOPY  12/2017   DILATATION & CURRETTAGE/HYSTEROSCOPY WITH RESECTOCOPE N/A 12/02/2012   Procedure: DILATATION & CURETTAGE/HYSTEROSCOPY WITH RESECTOCOPE;  Surgeon: Darlyn Chamber, MD;  Location: [facility];  Service: Gynecology;  Laterality: N/A;   GANGLION CYST EXCISION  2007   HAND   HERNIA REPAIR  2010   Exploratory   HYSTEROSCOPIC MYOMECTOMY AND ABDOMINAL MYOMECTOMY  2006   PARATHYROIDECTOMY  01/28/2011   Procedure: PARATHYROIDECTOMY;  Surgeon: Earnstine Regal, MD;  Location: [facility];  Service: General;  Laterality: N/A;    STRESS ECHO REPORT  09-01-2009  DR Martinique   NORMAL LVF/  EF 60%/  NORMAL HYPERDYNAMIC RESPONSE  TILT TABLE STUDY  08-21-2011    DR KLEIN   BP WERE CONSISTENT 110-117 THROUGH OUT STUDY/ BASELINE HR 68-70   TILT TABLE STUDY N/A 08/21/2011   Procedure: TILT TABLE STUDY;  Surgeon: Deboraha Sprang, MD;  Location: [facility];  Service: Cardiovascular;  Laterality: N/A;   TONSILLECTOMY  age 60    There were no vitals filed for this visit.   Subjective Assessment - 12/25/20 1103     Subjective I did my exercises some.    Currently in Pain? Yes    Pain Score 4     Pain Location Back                               [facility] Adult PT Treatment/Exercise - 12/25/20 0001       Knee/Hip Exercises: Aerobic   Nustep L4 x6 min.  PT present to provide cuing for pacing      Knee/Hip Exercises: Machines for Strengthening   Other Machine Rows and lat pulls 20# 2x10 each      Knee/Hip Exercises: Standing   Walking with Sports Cord resisted gait 10# x10 backwards/front      Knee/Hip Exercises: Seated   Other Seated Knee/Hip Exercises 3 way pball rollouts x10 each       Knee/Hip Exercises: Prone   Other Prone Exercises Quadruped:  Alt UE/LE extension x10 each      Manual Therapy   Manual Therapy Soft tissue mobilization;Myofascial release    Manual therapy comments in prone    Soft tissue mobilization STM to thoracic and lumbar regions    Myofascial Release manual trigger point release to thoracic spine                       PT Short Term Goals - 12/25/20 1152       PT SHORT TERM GOAL #1   Title be independent in initial HEP    Status Achieved      PT SHORT TERM GOAL #2   Title report a 25% reduction in thoracic and lumbar pain with daily tasks    Status Achieved               PT Long Term Goals - 12/18/20 1143       PT LONG TERM GOAL #1   Title be independent in advanced HEP    Status On-going      PT LONG TERM GOAL #2   Title report > or = to 40% reduction in thoracic and lumbar pain with home tasks and care of her child    Status On-going      PT LONG TERM GOAL #3   Title verbalize and demonstrate body mechanics modifications for lumbar protection with daily tasks    Status On-going      PT LONG TERM GOAL #4   Title improve FOTO to > 60 to improve function    Status On-going                   Plan - 12/25/20 1140     Clinical Impression Statement Pt with increased compliance with HEP since last session. Pt reported some increased pain in throacic region, so proceeded with treatment with manual therapy.  Following manual therapy, pt reported pain decreased to 1/10. Pt is progressing with ability to add more strengthening exercises and requires occasional cuing for postural support.    PT  Treatment/Interventions ADLs/Self Care Home Management;Cryotherapy;Electrical Stimulation;Moist Heat;Neuromuscular re-education;Therapeutic exercise;Therapeutic activities;Functional mobility training;Patient/family education;Manual techniques;Dry needling;Passive range of motion;Taping;Joint Manipulations;Spinal  Manipulations    PT Next Visit Plan continue to build HEP for flexiblity, gentle postural strength    Consulted and Agree with Plan of Care Patient             Patient will benefit from skilled therapeutic intervention in order to improve the following deficits and impairments:  Decreased activity tolerance, Postural dysfunction, Improper body mechanics, Impaired flexibility, Pain, Increased muscle spasms, Hypomobility, Decreased strength  Visit Diagnosis: Fibromyalgia  Abnormal posture  Chronic bilateral low back pain without sciatica  Pain in left hip  Pain in right hip  Other abnormalities of gait and mobility     Problem List Patient Active Problem List   Diagnosis Date Noted   Vitamin D deficiency 02/07/2020   Insulin resistance 02/07/2020   Class 1 obesity with serious comorbidity and body mass index (BMI) of 34.0 to 34.9 in adult 02/07/2020   Myofascial pain 01/12/2020   Acute post-traumatic headache, not intractable 06/02/2018   Fibromyalgia 06/05/2016   Other insomnia 06/05/2016   Other fatigue 06/05/2016   DDD (degenerative disc disease), cervical 06/05/2016   DDD (degenerative disc disease), lumbar 06/05/2016   Osteopenia of multiple sites 06/05/2016   History of asthma 06/05/2016   History of Hashimoto thyroiditis 06/05/2016   History of anxiety 06/05/2016   Seasonal affective disorder ([place]) 06/05/2016   Postmenopausal bleeding 12/02/2012    Class: Present on Admission   Fibroids, submucosal 12/02/2012    Class: Present on Admission   Hx of Lyme disease 06/02/2012   Postural dizziness 08/08/2011   Hyperparathyroidism, primary ([place]) 01/08/2011    Juel Burrow, PT, DPT 12/25/2020, 11:53 AM  [facility] @ [place], [place], [address] Phone: [phone]   Fax:  [phone]  Name: [name] MRN: [id number] Date of Birth: [birth year]

## 2021-01-01 ENCOUNTER — Other Ambulatory Visit: Payer: Self-pay

## 2021-01-01 ENCOUNTER — Encounter: Payer: 59 | Admitting: Rehabilitative and Restorative Service Providers"

## 2021-01-01 ENCOUNTER — Encounter: Payer: Self-pay | Admitting: Physical Medicine and Rehabilitation

## 2021-01-01 ENCOUNTER — Encounter: Payer: 59 | Attending: Physical Medicine and Rehabilitation | Admitting: Physical Medicine and Rehabilitation

## 2021-01-01 ENCOUNTER — Encounter: Payer: Self-pay | Admitting: Rehabilitative and Restorative Service Providers"

## 2021-01-01 ENCOUNTER — Ambulatory Visit
Payer: 59 | Attending: Physical Medicine and Rehabilitation | Admitting: Rehabilitative and Restorative Service Providers"

## 2021-01-01 VITALS — BP 137/88 | HR 86 | Temp 97.7°F | Ht 64.0 in | Wt 191.4 lb

## 2021-01-01 DIAGNOSIS — M797 Fibromyalgia: Secondary | ICD-10-CM | POA: Insufficient documentation

## 2021-01-01 DIAGNOSIS — G894 Chronic pain syndrome: Secondary | ICD-10-CM | POA: Diagnosis not present

## 2021-01-01 DIAGNOSIS — R2689 Other abnormalities of gait and mobility: Secondary | ICD-10-CM | POA: Insufficient documentation

## 2021-01-01 DIAGNOSIS — M25551 Pain in right hip: Secondary | ICD-10-CM | POA: Insufficient documentation

## 2021-01-01 DIAGNOSIS — G8929 Other chronic pain: Secondary | ICD-10-CM | POA: Insufficient documentation

## 2021-01-01 DIAGNOSIS — Z79891 Long term (current) use of opiate analgesic: Secondary | ICD-10-CM | POA: Insufficient documentation

## 2021-01-01 DIAGNOSIS — M25552 Pain in left hip: Secondary | ICD-10-CM | POA: Diagnosis not present

## 2021-01-01 DIAGNOSIS — M7918 Myalgia, other site: Secondary | ICD-10-CM | POA: Insufficient documentation

## 2021-01-01 DIAGNOSIS — M545 Low back pain, unspecified: Secondary | ICD-10-CM | POA: Insufficient documentation

## 2021-01-01 DIAGNOSIS — Z5181 Encounter for therapeutic drug level monitoring: Secondary | ICD-10-CM | POA: Diagnosis not present

## 2021-01-01 DIAGNOSIS — R293 Abnormal posture: Secondary | ICD-10-CM | POA: Insufficient documentation

## 2021-01-01 NOTE — Therapy (Signed)
[facility] @ [place], [place], [address] Phone: [phone]   Fax:  [phone]  Physical Therapy Treatment  Patient Details  Name: [name] MRN: [id number] Date of Birth: [birth year] Referring Provider (PT): Courtney Heys, MD   Encounter Date: 01/01/2021   PT End of Session - 01/01/21 1148     Visit Number 5    Date for PT Re-Evaluation 01/29/21    Authorization Type UHC    PT Start Time 1143    PT Stop Time 1221    PT Time Calculation (min) 38 min    Activity Tolerance Patient tolerated treatment well    Behavior During Therapy [facility] for tasks assessed/performed             Past Medical History:  Diagnosis Date   Abnormal heart rate    Abnormal uterine bleeding (AUB)    Allergic rhinitis    Anemia    Anxiety    Asthma    B12 deficiency    Bruises easily    Cat-scratch disease    h/o   Chest pain    Chronic back pain    oa   Chronic fatigue syndrome    CMV (cytomegalovirus) ([place])    h/o   Constipation    Degenerative disc disease, cervical    and lumbar   Depression    Fibrocystic breast disease    Fibroids    Fibromyalgia    GERD (gastroesophageal reflux disease)    watches diet   H/O hiatal hernia    H/O myomectomy    Hashimoto thyroiditis    History of babesiosis    PARASITITIS RESOLVED   History of Bell's palsy    2000--  resolved   History of concussion    age 60--  no residual   History of hyperparathyroidism    PRIMARY--  S/P PARATHYROIDECTOMY 01-28-2011   History of palpitations    stress induced   History of stomach ulcers    History of uterine fibroid    Hypoglycemia    Hypothyroidism    Joint pain    Kidney disease    3A   Lyme disease    CHRONIC --  FOR 22 YRS (SINCE APPROX 1996)  AND HX CAT SCRATCH DISEASE WHICH HAS RESOLVED   Musculoskeletal chest pain    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   Osteoarthritis    all joints secondary to lyme disease/  back is  worse   Palpitations    SOB (shortness of breath)    Spinal stenosis    Swallowing difficulty    Vitamin B12 deficiency    Vitamin D deficiency    Wears contact lenses     Past Surgical History:  Procedure Laterality Date   BREAST BIOPSY Right 12/17/2018   benign   COLONOSCOPY  12/2017   DILATATION & CURRETTAGE/HYSTEROSCOPY WITH RESECTOCOPE N/A 12/02/2012   Procedure: DILATATION & CURETTAGE/HYSTEROSCOPY WITH RESECTOCOPE;  Surgeon: Darlyn Chamber, MD;  Location: [facility];  Service: Gynecology;  Laterality: N/A;   GANGLION CYST EXCISION  2007   HAND   HERNIA REPAIR  2010   Exploratory   HYSTEROSCOPIC MYOMECTOMY AND ABDOMINAL MYOMECTOMY  2006   PARATHYROIDECTOMY  01/28/2011   Procedure: PARATHYROIDECTOMY;  Surgeon: Earnstine Regal, MD;  Location: [facility];  Service: General;  Laterality: N/A;    STRESS ECHO REPORT  09-01-2009  DR Martinique   NORMAL LVF/  EF 60%/  NORMAL HYPERDYNAMIC RESPONSE  TILT TABLE STUDY  08-21-2011    DR KLEIN   BP WERE CONSISTENT 110-117 THROUGH OUT STUDY/ BASELINE HR 68-70   TILT TABLE STUDY N/A 08/21/2011   Procedure: TILT TABLE STUDY;  Surgeon: Deboraha Sprang, MD;  Location: [facility] CATH LAB;  Service: Cardiovascular;  Laterality: N/A;   TONSILLECTOMY  age 60    There were no vitals filed for this visit.   Subjective Assessment - 01/01/21 1146     Subjective Pt reports that she went to Dr Dagoberto Ligas just before this appointment and received multiple trigger point injections.    Patient Stated Goals reduce LBP    Currently in Pain? Yes    Pain Score 3     Pain Location Back    Pain Orientation Lower    Pain Descriptors / Indicators Dull;Aching    Pain Type Chronic pain    Pain Relieving Factors Tramadol                               [facility] Adult PT Treatment/Exercise - 01/01/21 0001       Knee/Hip Exercises: Aerobic   Nustep L4 x6 min.  PT present to provide cuing for pacing      Knee/Hip Exercises: Machines for  Strengthening   Cybex Leg Press 50# 2x10    Other Machine Rows and lat pulls 20# 2x10 each      Knee/Hip Exercises: Standing   Walking with Sports Cord 4 way resisted gait 10# x3 each    Other Standing Knee Exercises A/R press with red theraband x10B      Knee/Hip Exercises: Seated   Other Seated Knee/Hip Exercises 3 way pball rollouts x10 each    Sit to Sand 10 reps   x10 with yellow wt ball chest press, x10 with yellow wt ball overhead press                      PT Short Term Goals - 12/25/20 1152       PT SHORT TERM GOAL #1   Title be independent in initial HEP    Status Achieved      PT SHORT TERM GOAL #2   Title report a 25% reduction in thoracic and lumbar pain with daily tasks    Status Achieved               PT Long Term Goals - 01/01/21 1225       PT LONG TERM GOAL #1   Title be independent in advanced HEP    Status On-going                   Plan - 01/01/21 1207     Clinical Impression Statement Pt reports that she only did her HEP once last week, stating a very busy week. Pt continues to tolerate weight machines well and without complaint of increased pain. Pt continues to have relief with seated physioball roll out to stretch her lumbar region. Pt continues to progress towards goal related activities and will assess progress next visit after her trigger point injections have had a chance to start working more.    PT Treatment/Interventions ADLs/Self Care Home Management;Cryotherapy;Electrical Stimulation;Moist Heat;Neuromuscular re-education;Therapeutic exercise;Therapeutic activities;Functional mobility training;Patient/family education;Manual techniques;Dry needling;Passive range of motion;Taping;Joint Manipulations;Spinal Manipulations    PT Next Visit Plan continue to build HEP for flexiblity, gentle postural strength    Consulted and Agree with Plan of Care  Patient             Patient will benefit from skilled therapeutic  intervention in order to improve the following deficits and impairments:  Decreased activity tolerance, Postural dysfunction, Improper body mechanics, Impaired flexibility, Pain, Increased muscle spasms, Hypomobility, Decreased strength  Visit Diagnosis: Fibromyalgia  Abnormal posture  Chronic bilateral low back pain without sciatica  Pain in left hip  Pain in right hip  Other abnormalities of gait and mobility     Problem List Patient Active Problem List   Diagnosis Date Noted   Vitamin D deficiency 02/07/2020   Insulin resistance 02/07/2020   Class 1 obesity with serious comorbidity and body mass index (BMI) of 34.0 to 34.9 in adult 02/07/2020   Myofascial pain 01/12/2020   Acute post-traumatic headache, not intractable 06/02/2018   Fibromyalgia 06/05/2016   Other insomnia 06/05/2016   Other fatigue 06/05/2016   DDD (degenerative disc disease), cervical 06/05/2016   DDD (degenerative disc disease), lumbar 06/05/2016   Osteopenia of multiple sites 06/05/2016   History of asthma 06/05/2016   History of Hashimoto thyroiditis 06/05/2016   History of anxiety 06/05/2016   Seasonal affective disorder ([place]) 06/05/2016   Postmenopausal bleeding 12/02/2012    Class: Present on Admission   Fibroids, submucosal 12/02/2012    Class: Present on Admission   Hx of Lyme disease 06/02/2012   Postural dizziness 08/08/2011   Hyperparathyroidism, primary ([place]) 01/08/2011    Juel Burrow, PT, DPT 01/01/2021, 12:26 PM  [facility] @ [place] [address] [place], [place], [address] Phone: [phone]   Fax:  [phone]  Name: [name] MRN: [id number] Date of Birth: 06/02/1960

## 2021-01-01 NOTE — Patient Instructions (Signed)
Assessment: Fibromyalgia AND myofascial pain syndrome. Along with complicated medical issues including Lyme's disease as documented above  Patient here for trigger point injections for  Consent done and on chart.  Cleaned areas with alcohol and injected using a 27 gauge 1.5 inch needle  Injected 5 cc Using 1% Lidocaine with no EPI  Upper traps Levators Posterior scalenes Middle scalenes Splenius Capitus Pectoralis Major Rhomboids Infraspinatus Teres Major/minor Thoracic paraspinals Lumbar paraspinals Other injections- injected B/L thighs near knee and R quad and BL hands; and B/L 1st dorsal interossei in feet and anterior tibialis.    Patient's level of pain prior was Current level of pain after injections is  There was no bleeding or complications.  Patient was advised to drink a lot of water on day after injections to flush system Will have increased soreness for 12-48 hours after injections.  Can use Lidocaine patches the day AFTER injections Can use theracane on day of injections in places didn't inject Can use heating pad 4-6 hours AFTER injections  2. Pec stretch As a reminder- 90-120 seconds to hold. Demonstrated again.   3. Suggest tennis balls in car to work on back, buttocks and B/L hamstrings- back of thighs.   4. Will call in Low dose Naltrexone 6 mg nightly- with 5 refills  5. F/U in 2 months if possible for TrP injections. And f/u on chronic  pain.   6. Need to take care of yourself!  7. UDS today- since on tramadol.

## 2021-01-01 NOTE — Progress Notes (Signed)
Pt is a 60 yr old female (with birthday yesterday)  with hx of asthma, DDD, DJD,  Cat scratch disease, Hashimoto's thyroiditis,Lyme's disease, EBV, chronic low back pain , fibromyalgia, and generalized chronic pain here for f/u on chronic pain issues.     Gained some weight back due to insulin resistance- On [place] for weight loss- on injections. [facility].   Was 188, but up to 191 lbs again today.   Is doing pec stretch with PT Trying to do exercises- 1-2x/week.  Son gets right next to her and on top of her doing exercises.   Restarted naltrexone and fell behind nightstand and didn't call for refill.   Worst problem falling asleep- stayed up til midnight last night trying to sleep. Went to be at 9:30pm.   Seeing PT- 1x/week.  Not using theracane- feels like doesn't have time.    Plan:  Assessment: Fibromyalgia AND myofascial pain syndrome. Along with complicated medical issues including Lyme's disease as documented above  Patient here for trigger point injections for  Consent done and on chart.  Cleaned areas with alcohol and injected using a 27 gauge 1.5 inch needle  Injected 5 cc Using 1% Lidocaine with no EPI  Upper traps Levators Posterior scalenes Middle scalenes Splenius Capitus Pectoralis Major Rhomboids Infraspinatus Teres Major/minor Thoracic paraspinals Lumbar paraspinals Other injections- injected B/L thighs near knee and R quad and BL hands; and B/L 1st dorsal interossei in feet and anterior tibialis.    Patient's level of pain prior was Current level of pain after injections is  There was no bleeding or complications.  Patient was advised to drink a lot of water on day after injections to flush system Will have increased soreness for 12-48 hours after injections.  Can use Lidocaine patches the day AFTER injections Can use theracane on day of injections in places didn't inject Can use heating pad 4-6 hours AFTER  injections  2. Pec stretch As a reminder- 90-120 seconds to hold. Demonstrated again.   3. Suggest tennis balls in car to work on back, buttocks and B/L hamstrings- back of thighs.   4. Will call in Low dose Naltrexone 6 mg nightly- with 5 refills  5. F/U in 2 months if possible for TrP injections. And f/u on chronic  pain.   6. Need to take care of yourself!  7. UDS today- since on tramadol. Doesn't need refills right now.   I spent a total of 30 minutes on visit 10 minutes doing injections and 20 minutes on f/u. Calling in naltrexone counseling on using theracane, tennis balls, etc.

## 2021-01-06 LAB — TOXASSURE SELECT,+ANTIDEPR,UR

## 2021-01-08 ENCOUNTER — Other Ambulatory Visit: Payer: Self-pay

## 2021-01-08 ENCOUNTER — Encounter: Payer: Self-pay | Admitting: Rehabilitative and Restorative Service Providers"

## 2021-01-08 ENCOUNTER — Ambulatory Visit: Payer: 59 | Admitting: Rehabilitative and Restorative Service Providers"

## 2021-01-08 DIAGNOSIS — G8929 Other chronic pain: Secondary | ICD-10-CM | POA: Diagnosis not present

## 2021-01-08 DIAGNOSIS — M25552 Pain in left hip: Secondary | ICD-10-CM | POA: Diagnosis not present

## 2021-01-08 DIAGNOSIS — M25551 Pain in right hip: Secondary | ICD-10-CM

## 2021-01-08 DIAGNOSIS — M545 Low back pain, unspecified: Secondary | ICD-10-CM

## 2021-01-08 DIAGNOSIS — R293 Abnormal posture: Secondary | ICD-10-CM

## 2021-01-08 DIAGNOSIS — M797 Fibromyalgia: Secondary | ICD-10-CM

## 2021-01-08 NOTE — Patient Instructions (Signed)
Access Code: [email or web address] URL: [email or web address] Date: 01/08/2021 Prepared by: Shelby Dubin Kenika Sahm  Exercises Supine Lower Trunk Rotation - 3 x daily - 7 x weekly - 1 sets - 3 reps - 20 hold Hooklying Single Knee to Chest - 3 x daily - 7 x weekly - 1 sets - 3 reps - 20 hold Seated Hamstring Stretch - 3 x daily - 7 x weekly - 1 sets - 3 reps - 20 hold Seated Piriformis Stretch - 1-2 x daily - 7 x weekly - 1 sets - 2 reps - 20 sec hold Standing Row with Anchored Resistance - 1-2 x daily - 7 x weekly - 2 sets - 10 reps Shoulder extension with resistance - Neutral - 1-2 x daily - 7 x weekly - 2 sets - 10 reps Bird Dog - 1-2 x daily - 7 x weekly - 2 sets - 10 reps

## 2021-01-08 NOTE — Therapy (Addendum)
[facility] @ [place], [place], [address] Phone: [phone]   Fax:  [phone]  Physical Therapy Treatment  Patient Details  Name: [name] MRN: [id number] Date of Birth: 01-24-1961 Referring Provider (PT): Courtney Heys, MD   Encounter Date: 01/08/2021   PT End of Session - 01/08/21 0854     Visit Number 6    Date for PT Re-Evaluation 01/29/21    Authorization Type UHC    PT Start Time 2952    PT Stop Time 0926    PT Time Calculation (min) 40 min    Activity Tolerance Patient tolerated treatment well    Behavior During Therapy [facility] for tasks assessed/performed             Past Medical History:  Diagnosis Date   Abnormal heart rate    Abnormal uterine bleeding (AUB)    Allergic rhinitis    Anemia    Anxiety    Asthma    B12 deficiency    Bruises easily    Cat-scratch disease    h/o   Chest pain    Chronic back pain    oa   Chronic fatigue syndrome    CMV (cytomegalovirus) ([place])    h/o   Constipation    Degenerative disc disease, cervical    and lumbar   Depression    Fibrocystic breast disease    Fibroids    Fibromyalgia    GERD (gastroesophageal reflux disease)    watches diet   H/O hiatal hernia    H/O myomectomy    Hashimoto thyroiditis    History of babesiosis    PARASITITIS RESOLVED   History of Bell's palsy    2000--  resolved   History of concussion    age 3--  no residual   History of hyperparathyroidism    PRIMARY--  S/P PARATHYROIDECTOMY 01-28-2011   History of palpitations    stress induced   History of stomach ulcers    History of uterine fibroid    Hypoglycemia    Hypothyroidism    Joint pain    Kidney disease    3A   Lyme disease    CHRONIC --  FOR 22 YRS (SINCE APPROX 1996)  AND HX CAT SCRATCH DISEASE WHICH HAS RESOLVED   Musculoskeletal chest pain    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   Osteoarthritis    all joints secondary to lyme disease/  back  is worse   Palpitations    SOB (shortness of breath)    Spinal stenosis    Swallowing difficulty    Vitamin B12 deficiency    Vitamin D deficiency    Wears contact lenses     Past Surgical History:  Procedure Laterality Date   BREAST BIOPSY Right 12/17/2018   benign   COLONOSCOPY  12/2017   DILATATION & CURRETTAGE/HYSTEROSCOPY WITH RESECTOCOPE N/A 12/02/2012   Procedure: DILATATION & CURETTAGE/HYSTEROSCOPY WITH RESECTOCOPE;  Surgeon: Darlyn Chamber, MD;  Location: [facility];  Service: Gynecology;  Laterality: N/A;   GANGLION CYST EXCISION  2007   HAND   HERNIA REPAIR  2010   Exploratory   HYSTEROSCOPIC MYOMECTOMY AND ABDOMINAL MYOMECTOMY  2006   PARATHYROIDECTOMY  01/28/2011   Procedure: PARATHYROIDECTOMY;  Surgeon: Earnstine Regal, MD;  Location: [facility];  Service: General;  Laterality: N/A;    STRESS ECHO REPORT  09-01-2009  DR [place]ique   NORMAL LVF/  EF 60%/  NORMAL HYPERDYNAMIC RESPONSE  TILT TABLE STUDY  08-21-2011    DR KLEIN   BP WERE CONSISTENT 110-117 THROUGH OUT STUDY/ BASELINE HR 68-70   TILT TABLE STUDY N/A 08/21/2011   Procedure: TILT TABLE STUDY;  Surgeon: Deboraha Sprang, MD;  [facility] C[facility];  Service: Cardiovascular;  Laterality: N/A;   TONSILLECTOMY  age 60    There were no vitals filed for this visit.   Subjective Assessment - 01/08/21 0853     Subjective Pt reports that she is doing okay    Patient Stated Goals reduce LBP    Currently in Pain? Yes    Pain Score 5     Pain Location Back    Pain Orientation Lower                               [facility] Adult PT Treatment/Exercise - 01/08/21 0001       Knee/Hip Exercises: Stretches   Passive Hamstring Stretch Both;2 reps;20 seconds    Passive Hamstring Stretch Limitations in sitting    Piriformis Stretch Both;2 reps;20 seconds    Piriformis Stretch Limitations in sitting      Knee/Hip Exercises: Aerobic   Nustep L5 x6 min.  PT present to provide cuing for pacing       Knee/Hip Exercises: Machines for Strengthening   Cybex Leg Press 60# 2x10, seat on 5    Other Machine Rows and lat pulls 25# 2x10 each      Knee/Hip Exercises: Standing   Walking with Sports Cord 4 way resisted gait 10# x3 each    Other Standing Knee Exercises A/R press with red theraband x10B standing on blue foam.      Knee/Hip Exercises: Supine   Other Supine Knee/Hip Exercises Alt LE/UE in quadruped 2x10 reps                     PT Education - 01/08/21 0923     Education Details Updated HEP    Person(s) Educated Patient    Methods Explanation;Handout    Comprehension Verbalized understanding;Returned demonstration              PT Short Term Goals - 12/25/20 1152       PT SHORT TERM GOAL #1   Title be independent in initial HEP    Status Achieved      PT SHORT TERM GOAL #2   Title report a 25% reduction in thoracic and lumbar pain with daily tasks    Status Achieved               PT Long Term Goals - 01/08/21 0950       PT LONG TERM GOAL #1   Title be independent in advanced HEP    Status Partially Met      PT LONG TERM GOAL #2   Title report > or = to 40% reduction in thoracic and lumbar pain with home tasks and care of her child    Status On-going      PT LONG TERM GOAL #3   Title verbalize and demonstrate body mechanics modifications for lumbar protection with daily tasks    Status Partially Met      PT LONG TERM GOAL #4   Title improve FOTO to > 60 to improve function    Status On-going                   Plan -  01/08/21 0925     Clinical Impression Statement Pt continues to progress towards goal related activities.  Updated HEP and provided pt red theraband to utilize at home. Pt is progressing with improved posture noted during ther ex, but did require occasional cuing during seated hamstring stretch to maintain improved back posture for improved stretch and decreased strain on back.    Comorbidities fibromyalgia,  lyme disease, spinal stenosis    PT Treatment/Interventions ADLs/Self Care Home Management;Cryotherapy;Electrical Stimulation;Moist Heat;Neuromuscular re-education;Therapeutic exercise;Therapeutic activities;Functional mobility training;Patient/family education;Manual techniques;Dry needling;Passive range of motion;Taping;Joint Manipulations;Spinal Manipulations    PT Next Visit Plan continue to build HEP for flexiblity, gentle postural strength    PT Home Exercise Plan Access Code: [email or web address]    Consulted and Agree with Plan of Care Patient             Patient will benefit from skilled therapeutic intervention in order to improve the following deficits and impairments:  Decreased activity tolerance, Postural dysfunction, Improper body mechanics, Impaired flexibility, Pain, Increased muscle spasms, Hypomobility, Decreased strength  Visit Diagnosis: Fibromyalgia  Abnormal posture  Chronic bilateral low back pain without sciatica  Pain in left hip  Pain in right hip     Problem List Patient Active Problem List   Diagnosis Date Noted   Vitamin D deficiency 02/07/2020   Insulin resistance 02/07/2020   Class 1 obesity with serious comorbidity and body mass index (BMI) of 34.0 to 34.9 in adult 02/07/2020   Myofascial pain 01/12/2020   Acute post-traumatic headache, not intractable 06/02/2018   Fibromyalgia 06/05/2016   Other insomnia 06/05/2016   Other fatigue 06/05/2016   DDD (degenerative disc disease), cervical 06/05/2016   DDD (degenerative disc disease), lumbar 06/05/2016   Osteopenia of multiple sites 06/05/2016   History of asthma 06/05/2016   History of Hashimoto thyroiditis 06/05/2016   History of anxiety 06/05/2016   Seasonal affective disorder ([place]) 06/05/2016   Postmenopausal bleeding 12/02/2012    Class: Present on Admission   Fibroids, submucosal 12/02/2012    Class: Present on Admission   Hx of Lyme disease 06/02/2012   Postural dizziness 08/08/2011    Hyperparathyroidism, primary ([place]) 01/08/2011   PHYSICAL THERAPY DISCHARGE SUMMARY  Pt did not return for last scheduled visit and did not call to make further appointments.  Pt discharged from PT on 02/01/21.  Patient agrees to discharge. Patient goals were partially met. Patient is being discharged due to not returning since the last visit.   Juel Burrow, PT, DPT 01/08/2021, 9:51 AM  [facility] @ [place], [place], [address] Phone: [phone]   Fax:  [phone]  Name: JOWANA THUMMA MRN: [id number] Date of Birth: 01/26/1961

## 2021-01-09 ENCOUNTER — Other Ambulatory Visit: Payer: Self-pay | Admitting: Family Medicine

## 2021-01-09 DIAGNOSIS — K224 Dyskinesia of esophagus: Secondary | ICD-10-CM

## 2021-01-10 ENCOUNTER — Telehealth: Payer: Self-pay | Admitting: *Deleted

## 2021-01-10 DIAGNOSIS — Z6832 Body mass index (BMI) 32.0-32.9, adult: Secondary | ICD-10-CM | POA: Diagnosis not present

## 2021-01-10 DIAGNOSIS — Z01419 Encounter for gynecological examination (general) (routine) without abnormal findings: Secondary | ICD-10-CM | POA: Diagnosis not present

## 2021-01-10 NOTE — Telephone Encounter (Signed)
Urine drug screen for this encounter is consistent for prescribed medication 

## 2021-01-11 ENCOUNTER — Ambulatory Visit
Admission: RE | Admit: 2021-01-11 | Discharge: 2021-01-11 | Disposition: A | Discharge status: Home / Self Care | Payer: 59 | Source: Ambulatory Visit | Attending: Family Medicine | Admitting: Family Medicine

## 2021-01-11 ENCOUNTER — Other Ambulatory Visit: Payer: Self-pay | Admitting: Obstetrics and Gynecology

## 2021-01-11 ENCOUNTER — Other Ambulatory Visit: Payer: Self-pay | Admitting: Family Medicine

## 2021-01-11 DIAGNOSIS — K224 Dyskinesia of esophagus: Secondary | ICD-10-CM

## 2021-01-11 DIAGNOSIS — K118 Other diseases of salivary glands: Secondary | ICD-10-CM

## 2021-01-15 ENCOUNTER — Encounter: Payer: 59 | Admitting: Rehabilitative and Restorative Service Providers"

## 2021-01-22 ENCOUNTER — Telehealth: Payer: Self-pay | Admitting: Rehabilitative and Restorative Service Providers"

## 2021-01-22 ENCOUNTER — Ambulatory Visit: Payer: 59 | Admitting: Rehabilitative and Restorative Service Providers"

## 2021-01-22 ENCOUNTER — Ambulatory Visit
Admission: RE | Admit: 2021-01-22 | Discharge: 2021-01-22 | Disposition: A | Discharge status: Home / Self Care | Payer: 59 | Source: Ambulatory Visit | Attending: Obstetrics and Gynecology | Admitting: Obstetrics and Gynecology

## 2021-01-22 DIAGNOSIS — K118 Other diseases of salivary glands: Secondary | ICD-10-CM

## 2021-01-22 NOTE — Telephone Encounter (Signed)
Pt was a 'no show' for her appointment.  Called and left a message on voicemail to ask pt to call back and to reschedule, if needed.

## 2021-01-25 ENCOUNTER — Other Ambulatory Visit: Payer: Self-pay

## 2021-01-25 ENCOUNTER — Encounter (INDEPENDENT_AMBULATORY_CARE_PROVIDER_SITE_OTHER): Payer: Self-pay | Admitting: Family Medicine

## 2021-01-25 ENCOUNTER — Ambulatory Visit (INDEPENDENT_AMBULATORY_CARE_PROVIDER_SITE_OTHER): Payer: 59 | Admitting: Family Medicine

## 2021-01-25 VITALS — BP 121/80 | HR 85 | Temp 97.8°F | Ht 64.0 in | Wt 185.0 lb

## 2021-01-25 DIAGNOSIS — M797 Fibromyalgia: Secondary | ICD-10-CM

## 2021-01-25 DIAGNOSIS — E8881 Metabolic syndrome: Secondary | ICD-10-CM | POA: Diagnosis not present

## 2021-01-25 DIAGNOSIS — Z6834 Body mass index (BMI) 34.0-34.9, adult: Secondary | ICD-10-CM

## 2021-01-25 DIAGNOSIS — R632 Polyphagia: Secondary | ICD-10-CM | POA: Diagnosis not present

## 2021-01-25 DIAGNOSIS — E669 Obesity, unspecified: Secondary | ICD-10-CM

## 2021-01-25 MED ORDER — TIRZEPATIDE 7.5 MG/0.5ML ~~LOC~~ SOAJ: 7.5000 mg | SUBCUTANEOUS | 0 refills | Status: DC

## 2021-01-29 NOTE — Progress Notes (Signed)
Chief Complaint:   OBESITY [name] is here to discuss her progress with her obesity treatment plan along with follow-up of her obesity related diagnoses. See Medical Weight Management Flowsheet for complete bioelectrical impedance results.  Today's visit was #: 13 Starting weight: 201 lbs Starting date: 01/03/2020 Weight change since last visit: 4 lbs Total lbs lost to date: 16 lbs Total weight loss percentage to date: -7.96%  Nutrition Plan: Lower carbohydrate, vegetable and lean protein rich diet plan for 5% of the time. Activity: None. Anti-obesity medications: Mounjaro 5 mg subcutaneously weekly. Reported side effects: None.  Interim History: [name]'s swallow study was within normal limits.  She will have a left neck ultrasound due to "choking on water".  She had labs with Dr. Alyson Gonzalez.  Assessment/Plan:   1. Insulin resistance Not at goal. Goal is HgbA1c < 5.7, fasting insulin closer to 5.  Medication: Mounjaro 5 mg subcutaneously weekly.    Plan:  Increase Mounjaro to 7.5 mg subcutaneously weekly.  She will continue to focus on protein-rich, low simple carbohydrate foods. We reviewed the importance of hydration, regular exercise for stress reduction, and restorative sleep.   Lab Results  Component Value Date   HGBA1C 5.1 01/03/2020   Lab Results  Component Value Date   INSULIN 11.6 01/03/2020   - Increase tirzepatide (MOUNJARO) 7.5 MG/0.5ML Pen; Inject 7.5 mg into the skin once a week.  Dispense: 2 mL; Refill: 0  2. Fibromyalgia [name] is currently in PT.     Intensive lifestyle modifications are the first line treatment for this issue. We discussed several lifestyle modifications today and she will continue to work on diet, exercise and weight loss efforts.We will continue to monitor.   3. Polyphagia Improving, but not optimized. Current treatment: Mounjaro 5 mg subcutaneously weekly.    Plan: Increase Mounjaro to 7.5 mg subcutaneously weekly.  She will continue to  focus on protein-rich, low simple carbohydrate foods. We reviewed the importance of hydration, regular exercise for stress reduction, and restorative sleep.  4. Obesity BMI today is 31  Course: [name] is currently in the action stage of change. As such, her goal is to continue with weight loss efforts.   Nutrition goals: She has agreed to following a lower carbohydrate, vegetable and lean protein rich diet plan.   Exercise goals:  As tolerated.  Behavioral modification strategies: increasing lean protein intake, decreasing simple carbohydrates, increasing vegetables, and increasing water intake.  [name] has agreed to follow-up with our clinic in 4 weeks. She was informed of the importance of frequent follow-up visits to maximize her success with intensive lifestyle modifications for her multiple health conditions.   Objective:   Blood pressure 121/80, pulse 85, temperature 97.8 F (36.6 C), temperature source Oral, height 5\' 4"  (1.626 m), weight 185 lb (83.9 kg), last menstrual period 01/22/2011, SpO2 96 %. Body mass index is 31.76 kg/m.  General: Cooperative, alert, well developed, in no acute distress. HEENT: Conjunctivae and lids unremarkable. Cardiovascular: Regular rhythm.  Lungs: Normal work of breathing. Neurologic: No focal deficits.   Lab Results  Component Value Date   CREATININE 1.04 (H) 04/19/2020   BUN 19 04/19/2020   NA 140 04/19/2020   K 3.7 04/19/2020   CL 104 04/19/2020   CO2 24 04/19/2020   Lab Results  Component Value Date   ALT 56 (H) 04/19/2020   AST 37 04/19/2020   ALKPHOS 61 04/19/2020   BILITOT 0.7 04/19/2020   Lab Results  Component Value Date  HGBA1C 5.1 01/03/2020   Lab Results  Component Value Date   INSULIN 11.6 01/03/2020   Lab Results  Component Value Date   TSH 3.920 01/03/2020   Lab Results  Component Value Date   CHOL 198 01/03/2020   HDL 52 01/03/2020   LDLCALC 126 (H) 01/03/2020   TRIG 114 01/03/2020   Lab Results   Component Value Date   VD25OH 35.0 01/03/2020   Lab Results  Component Value Date   WBC 5.9 04/19/2020   HGB 14.8 04/19/2020   HCT 42.5 04/19/2020   MCV 88.5 04/19/2020   PLT 207 04/19/2020   Attestation Statements:   Reviewed by clinician on day of visit: allergies, medications, problem list, medical history, surgical history, family history, social history, and previous encounter notes.  I, Water quality scientist, CMA, am acting as transcriptionist for Briscoe Deutscher, DO  I have reviewed the above documentation for accuracy and completeness, and I agree with the above. -  Briscoe Deutscher, DO, MS, FAAFP, DABOM - Family and Bariatric Medicine.

## 2021-01-30 ENCOUNTER — Telehealth (INDEPENDENT_AMBULATORY_CARE_PROVIDER_SITE_OTHER): Payer: Self-pay | Admitting: Family Medicine

## 2021-01-30 DIAGNOSIS — R7301 Impaired fasting glucose: Secondary | ICD-10-CM

## 2021-01-30 NOTE — Telephone Encounter (Signed)
[facility] for [facility]. The 7.5 mg is on back order.

## 2021-01-31 MED ORDER — TIRZEPATIDE 5 MG/0.5ML ~~LOC~~ SOAJ: 5.0000 mg | SUBCUTANEOUS | 0 refills | Status: DC

## 2021-01-31 NOTE — Telephone Encounter (Signed)
RX sent

## 2021-03-12 ENCOUNTER — Ambulatory Visit: Payer: 59 | Admitting: Physical Medicine and Rehabilitation

## 2021-03-15 ENCOUNTER — Ambulatory Visit (INDEPENDENT_AMBULATORY_CARE_PROVIDER_SITE_OTHER): Payer: 59 | Admitting: Family Medicine

## 2021-03-15 ENCOUNTER — Encounter (INDEPENDENT_AMBULATORY_CARE_PROVIDER_SITE_OTHER): Payer: Self-pay | Admitting: Family Medicine

## 2021-03-15 ENCOUNTER — Other Ambulatory Visit: Payer: Self-pay

## 2021-03-15 VITALS — BP 114/75 | HR 71 | Temp 97.6°F | Ht 64.0 in | Wt 188.0 lb

## 2021-03-15 DIAGNOSIS — D171 Benign lipomatous neoplasm of skin and subcutaneous tissue of trunk: Secondary | ICD-10-CM | POA: Diagnosis not present

## 2021-03-15 DIAGNOSIS — L918 Other hypertrophic disorders of the skin: Secondary | ICD-10-CM | POA: Diagnosis not present

## 2021-03-15 DIAGNOSIS — E669 Obesity, unspecified: Secondary | ICD-10-CM

## 2021-03-15 DIAGNOSIS — R5383 Other fatigue: Secondary | ICD-10-CM

## 2021-03-15 DIAGNOSIS — K760 Fatty (change of) liver, not elsewhere classified: Secondary | ICD-10-CM

## 2021-03-15 DIAGNOSIS — R7301 Impaired fasting glucose: Secondary | ICD-10-CM

## 2021-03-15 DIAGNOSIS — Z6832 Body mass index (BMI) 32.0-32.9, adult: Secondary | ICD-10-CM

## 2021-03-15 DIAGNOSIS — L720 Epidermal cyst: Secondary | ICD-10-CM | POA: Diagnosis not present

## 2021-03-15 DIAGNOSIS — D1801 Hemangioma of skin and subcutaneous tissue: Secondary | ICD-10-CM | POA: Diagnosis not present

## 2021-03-15 DIAGNOSIS — D2221 Melanocytic nevi of right ear and external auricular canal: Secondary | ICD-10-CM | POA: Diagnosis not present

## 2021-03-15 DIAGNOSIS — L814 Other melanin hyperpigmentation: Secondary | ICD-10-CM | POA: Diagnosis not present

## 2021-03-15 DIAGNOSIS — L438 Other lichen planus: Secondary | ICD-10-CM | POA: Diagnosis not present

## 2021-03-15 DIAGNOSIS — D2272 Melanocytic nevi of left lower limb, including hip: Secondary | ICD-10-CM | POA: Diagnosis not present

## 2021-03-15 MED ORDER — TIRZEPATIDE 7.5 MG/0.5ML ~~LOC~~ SOAJ: 7.5000 mg | SUBCUTANEOUS | 2 refills | Status: DC

## 2021-03-19 DIAGNOSIS — M5412 Radiculopathy, cervical region: Secondary | ICD-10-CM | POA: Diagnosis not present

## 2021-03-21 NOTE — Progress Notes (Signed)
Chief Complaint:   OBESITY [name] is here to discuss her progress with her obesity treatment plan along with follow-up of her obesity related diagnoses. See Medical Weight Management Flowsheet for complete bioelectrical impedance results.  Today's visit was #: 14 Starting weight: 201 lbs Starting date: 01/03/2020 Weight change since last visit: +3 lbs Total lbs lost to date: 13 lbs Total weight loss percentage to date: -6.47%  Nutrition Plan: Lower carbohydrate, vegetable and lean protein rich diet plan for 20% of the time. Activity: Walking for 45 minutes 2 times per week.  Anti-obesity medications: Mounjaro 7.5 mg subcutaneously weekly. Reported side effects: None.  Interim History: Etienne says she has had increased fatigue/sleepiness lately.  She thinks it is due to her treatment for lyme disease through her Functional Medicine provider.  She says she is focused on low carbs.  Assessment/Plan:   1. Impaired fasting glucose Bonnell is taking Mounjaro 7.5 mg subcutaneously weekly.  Plan:  Continue 7.5 mg subcutaneously weekly.  Will refill today, as per below.  - Refill tirzepatide (MOUNJARO) 7.5 MG/0.5ML Pen; Inject 7.5 mg into the skin once a week.  Dispense: 6 mL; Refill: 2  2. NAFLD (nonalcoholic fatty liver disease) Intensive lifestyle modifications are the first line treatment for this issue. We discussed several lifestyle modifications today and she will continue to work on diet, exercise and weight loss efforts.   3. Other fatigue Sandrika endorses more fatigue than usual recently. She will continue to focus on protein-rich, low simple carbohydrate foods. We reviewed the importance of hydration, regular exercise for stress reduction, and restorative sleep.   4. Obesity BMI today is 32.4  Course: Alichia is currently in the action stage of change. As such, her goal is to continue with weight loss efforts.   Nutrition goals: She has agreed to following a lower carbohydrate,  vegetable and lean protein rich diet plan.   Exercise goals:  As is.  Behavioral modification strategies: increasing lean protein intake, decreasing simple carbohydrates, increasing vegetables, and increasing water intake.  Makilah has agreed to follow-up with our clinic in 4 weeks. She was informed of the importance of frequent follow-up visits to maximize her success with intensive lifestyle modifications for her multiple health conditions.   Tristina was informed we would discuss her lab results at her next visit unless there is a critical issue that needs to be addressed sooner. Mikayela agreed to keep her next visit at the agreed upon time to discuss these results.  Objective:   Blood pressure 114/75, pulse 71, temperature 97.6 F (36.4 C), height 5\' 4"  (1.626 m), weight 188 lb (85.3 kg), last menstrual period 01/22/2011, SpO2 98 %. Body mass index is 32.27 kg/m.  General: Cooperative, alert, well developed, in no acute distress. HEENT: Conjunctivae and lids unremarkable. Cardiovascular: Regular rhythm.  Lungs: Normal work of breathing. Neurologic: No focal deficits.   Lab Results  Component Value Date   CREATININE 1.04 (H) 04/19/2020   BUN 19 04/19/2020   NA 140 04/19/2020   K 3.7 04/19/2020   CL 104 04/19/2020   CO2 24 04/19/2020   Lab Results  Component Value Date   ALT 56 (H) 04/19/2020   AST 37 04/19/2020   ALKPHOS 61 04/19/2020   BILITOT 0.7 04/19/2020   Lab Results  Component Value Date   HGBA1C 5.1 01/03/2020   Lab Results  Component Value Date   INSULIN 11.6 01/03/2020   Lab Results  Component Value Date   TSH 3.920 01/03/2020  Lab Results  Component Value Date   CHOL 198 01/03/2020   HDL 52 01/03/2020   LDLCALC 126 (H) 01/03/2020   TRIG 114 01/03/2020   Lab Results  Component Value Date   VD25OH 35.0 01/03/2020   Lab Results  Component Value Date   WBC 5.9 04/19/2020   HGB 14.8 04/19/2020   HCT 42.5 04/19/2020   MCV 88.5 04/19/2020   PLT 207  04/19/2020   Attestation Statements:   Reviewed by clinician on day of visit: allergies, medications, problem list, medical history, surgical history, family history, social history, and previous encounter notes.  I, Water quality scientist, CMA, am acting as transcriptionist for Briscoe Deutscher, DO  I have reviewed the above documentation for accuracy and completeness, and I agree with the above. -  Briscoe Deutscher, DO, MS, FAAFP, DABOM - Family and Bariatric Medicine.

## 2021-03-26 ENCOUNTER — Encounter: Payer: Self-pay | Admitting: Physical Medicine and Rehabilitation

## 2021-03-26 ENCOUNTER — Other Ambulatory Visit: Payer: Self-pay

## 2021-03-26 ENCOUNTER — Encounter: Payer: 59 | Attending: Physical Medicine and Rehabilitation | Admitting: Physical Medicine and Rehabilitation

## 2021-03-26 VITALS — BP 130/89 | HR 93 | Ht 64.0 in | Wt 191.0 lb

## 2021-03-26 DIAGNOSIS — A6920 Lyme disease, unspecified: Secondary | ICD-10-CM | POA: Diagnosis not present

## 2021-03-26 DIAGNOSIS — M797 Fibromyalgia: Secondary | ICD-10-CM | POA: Insufficient documentation

## 2021-03-26 DIAGNOSIS — Z8619 Personal history of other infectious and parasitic diseases: Secondary | ICD-10-CM

## 2021-03-26 DIAGNOSIS — M7918 Myalgia, other site: Secondary | ICD-10-CM

## 2021-03-26 DIAGNOSIS — G894 Chronic pain syndrome: Secondary | ICD-10-CM | POA: Insufficient documentation

## 2021-03-26 MED ORDER — TRAMADOL HCL 50 MG PO TABS: 50.0000 mg | ORAL_TABLET | Freq: Four times a day (QID) | ORAL | 5 refills | Status: DC | PRN

## 2021-03-26 NOTE — Patient Instructions (Addendum)
Plan: New LUE neuropathy- unknown cause.     1. [facility]- for EMG- have to be 21-28 days after initial onset of Symptoms, fyi. Sounds like it's been that long.    2. I agree with Dr Vertell Limber- or Dr Annette Stable- At [facility] town. If needs surgery.    3. Still taking Naltrexone 6 mg nightly. Con't regimen.    4. Refill Tramadol- has 3 refills left, but cannot use because last refill 6 months ago.    5. Make sure needs myofascial release on next therapy Rx- at next appt, will rewrite it.    6. Patient here for trigger point injections for  Consent done and on chart.  Cleaned areas with alcohol and injected using a 27 gauge 1.5 inch needle  Injected 5cc Using 1% Lidocaine with no EPI  Upper traps B/L  Levators Posterior scalenes Middle scalenes- B/L  Splenius Capitus Pectoralis Major Rhomboids B/L x4 Infraspinatus Teres Major/minor Thoracic paraspinals Lumbar paraspinals Other injections-    Patient's level of pain prior was 5/10 Current level of pain after injections is- about the same  There was no bleeding or complications.  Patient was advised to drink a lot of water on day after injections to flush system Will have increased soreness for 12-48 hours after injections.  Can use Lidocaine patches the day AFTER injections Can use theracane on day of injections in places didn't inject Can use heating pad 4-6 hours AFTER injections   7. Sugar not great for fibromyalgia.  8. F/U in 2-3 months   9. Tennis balls 2-4 minutes in each spot- not less than 2 minutes.

## 2021-03-26 NOTE — Progress Notes (Signed)
Pt is a 61 yr old female (with birthday yesterday)  with hx of asthma, DDD, DJD,  Cat scratch disease, Hashimoto's thyroiditis,Lyme's disease, EBV, chronic low back pain , fibromyalgia, and generalized chronic pain here for f/u on chronic pain issues.   Has LUE neuropathy-  Has gotten Cervical xray- through [facility] Didn't show spinal stenosis but getting LUE neuropathy S'xs.   Harder to sleep at night- to get into  a good position Pins and needles of entire LUE- more in hand- palmar surface and triceps and Flexor compartment of L forearm.   Started in December- 2022.  Hasn't gotten EMG done yet- .    Trigger point injections were helpful- lasted a few days for her.  But brain is always on her kids.    Lyme doctor told her to take BP daily, but hasn't done once yet- not doing a good job taking care of herself.  Has put tennis ball in car- hasn't actually used yet; or done pec stretch.   Getting more infusions for HSV and EBV- still on valtrex- and always gets scars when stops Valtrex.  Scared to stop.   Sleeping a LOT_ did therapy til November- wants ot wait til spring to restart.   Plan: New LUE neuropathy- unknown cause.     1. [facility]- for EMG- have to be 21-28 days after initial onset of Symptoms, fyi. Sounds like it's been that long.    2. I agree with Dr Vertell Limber- or Dr Annette Stable- At [facility] in town. If needs surgery.    3. Still taking Naltrexone 6 mg nightly. Con't regimen.    4. Refill Tramadol- has 3 refills left, but cannot use because last refill 6 months ago.    5. Make sure needs myofascial release on next therapy Rx- at next appt, will rewrite it.    6. Patient here for trigger point injections for  Consent done and on chart.  Cleaned areas with alcohol and injected using a 27 gauge 1.5 inch needle  Injected 5cc Using 1% Lidocaine with no EPI  Upper traps B/L  Levators Posterior scalenes Middle scalenes- B/L   Splenius Capitus Pectoralis Major Rhomboids B/L x4 Infraspinatus Teres Major/minor Thoracic paraspinals Lumbar paraspinals Other injections-    Patient's level of pain prior was 5/10 Current level of pain after injections is- about the same  There was no bleeding or complications.  Patient was advised to drink a lot of water on day after injections to flush system Will have increased soreness for 12-48 hours after injections.  Can use Lidocaine patches the day AFTER injections Can use theracane on day of injections in places didn't inject Can use heating pad 4-6 hours AFTER injections   7. Sugar not great for fibromyalgia.  8. F/U in 2-3 months   9. Tennis balls 2-4 minutes in each spot- not less than 2 minutes.

## 2021-03-28 DIAGNOSIS — K118 Other diseases of salivary glands: Secondary | ICD-10-CM | POA: Diagnosis not present

## 2021-03-29 ENCOUNTER — Telehealth: Payer: Self-pay | Admitting: Physical Medicine and Rehabilitation

## 2021-03-29 DIAGNOSIS — R29898 Other symptoms and signs involving the musculoskeletal system: Secondary | ICD-10-CM

## 2021-03-29 NOTE — Telephone Encounter (Signed)
Patient will need a referral to go to [facility].  She contacted their office and they told her that they would need one.

## 2021-03-29 NOTE — Telephone Encounter (Signed)
Notified. 

## 2021-04-02 ENCOUNTER — Encounter (INDEPENDENT_AMBULATORY_CARE_PROVIDER_SITE_OTHER): Payer: Self-pay | Admitting: Family Medicine

## 2021-04-02 ENCOUNTER — Telehealth (INDEPENDENT_AMBULATORY_CARE_PROVIDER_SITE_OTHER): Payer: Self-pay

## 2021-04-02 DIAGNOSIS — R7301 Impaired fasting glucose: Secondary | ICD-10-CM

## 2021-04-02 MED ORDER — TIRZEPATIDE 7.5 MG/0.5ML ~~LOC~~ SOAJ: 7.5000 mg | SUBCUTANEOUS | 2 refills | Status: DC

## 2021-04-02 NOTE — Telephone Encounter (Signed)
See other mychart message.

## 2021-04-02 NOTE — Telephone Encounter (Signed)
Patient requesting [facility] [address] [facility] her injection that is due tomorrow, 2/7.  Thank you

## 2021-04-02 NOTE — Telephone Encounter (Signed)
Last OV with Dr Wallace 

## 2021-04-02 NOTE — Telephone Encounter (Signed)
Pt last seen by Dr. Wallace.  

## 2021-04-02 NOTE — Telephone Encounter (Signed)
See my chart message

## 2021-04-04 ENCOUNTER — Telehealth: Payer: Self-pay | Admitting: Physical Medicine and Rehabilitation

## 2021-04-04 NOTE — Telephone Encounter (Signed)
Patient needs to schedule an L UE NCS/EMG with Dr. Wilfrid Lund Dr. Dagoberto Ligas.  I have left patient a voicemail to schedule this.

## 2021-04-05 DIAGNOSIS — L720 Epidermal cyst: Secondary | ICD-10-CM | POA: Diagnosis not present

## 2021-04-05 DIAGNOSIS — D171 Benign lipomatous neoplasm of skin and subcutaneous tissue of trunk: Secondary | ICD-10-CM | POA: Diagnosis not present

## 2021-04-06 DIAGNOSIS — M542 Cervicalgia: Secondary | ICD-10-CM | POA: Diagnosis not present

## 2021-04-08 DIAGNOSIS — M542 Cervicalgia: Secondary | ICD-10-CM | POA: Diagnosis not present

## 2021-04-13 ENCOUNTER — Other Ambulatory Visit: Payer: Self-pay | Admitting: Physician Assistant

## 2021-04-13 DIAGNOSIS — K118 Other diseases of salivary glands: Secondary | ICD-10-CM

## 2021-04-13 DIAGNOSIS — M542 Cervicalgia: Secondary | ICD-10-CM | POA: Diagnosis not present

## 2021-04-17 ENCOUNTER — Ambulatory Visit
Admission: RE | Admit: 2021-04-17 | Discharge: 2021-04-17 | Disposition: A | Discharge status: Home / Self Care | Payer: 59 | Source: Ambulatory Visit | Attending: Physician Assistant | Admitting: Physician Assistant

## 2021-04-17 DIAGNOSIS — E069 Thyroiditis, unspecified: Secondary | ICD-10-CM | POA: Diagnosis not present

## 2021-04-17 DIAGNOSIS — K118 Other diseases of salivary glands: Secondary | ICD-10-CM

## 2021-04-17 DIAGNOSIS — M50323 Other cervical disc degeneration at C6-C7 level: Secondary | ICD-10-CM | POA: Diagnosis not present

## 2021-04-17 DIAGNOSIS — E041 Nontoxic single thyroid nodule: Secondary | ICD-10-CM | POA: Diagnosis not present

## 2021-04-17 DIAGNOSIS — M50322 Other cervical disc degeneration at C5-C6 level: Secondary | ICD-10-CM | POA: Diagnosis not present

## 2021-04-18 DIAGNOSIS — M9902 Segmental and somatic dysfunction of thoracic region: Secondary | ICD-10-CM | POA: Diagnosis not present

## 2021-04-18 DIAGNOSIS — M5384 Other specified dorsopathies, thoracic region: Secondary | ICD-10-CM | POA: Diagnosis not present

## 2021-04-18 DIAGNOSIS — M9901 Segmental and somatic dysfunction of cervical region: Secondary | ICD-10-CM | POA: Diagnosis not present

## 2021-04-18 DIAGNOSIS — M50122 Cervical disc disorder at C5-C6 level with radiculopathy: Secondary | ICD-10-CM | POA: Diagnosis not present

## 2021-04-19 DIAGNOSIS — M5384 Other specified dorsopathies, thoracic region: Secondary | ICD-10-CM | POA: Diagnosis not present

## 2021-04-19 DIAGNOSIS — M50122 Cervical disc disorder at C5-C6 level with radiculopathy: Secondary | ICD-10-CM | POA: Diagnosis not present

## 2021-04-19 DIAGNOSIS — M9901 Segmental and somatic dysfunction of cervical region: Secondary | ICD-10-CM | POA: Diagnosis not present

## 2021-04-19 DIAGNOSIS — M9902 Segmental and somatic dysfunction of thoracic region: Secondary | ICD-10-CM | POA: Diagnosis not present

## 2021-04-23 DIAGNOSIS — M50122 Cervical disc disorder at C5-C6 level with radiculopathy: Secondary | ICD-10-CM | POA: Diagnosis not present

## 2021-04-23 DIAGNOSIS — M5384 Other specified dorsopathies, thoracic region: Secondary | ICD-10-CM | POA: Diagnosis not present

## 2021-04-23 DIAGNOSIS — M9901 Segmental and somatic dysfunction of cervical region: Secondary | ICD-10-CM | POA: Diagnosis not present

## 2021-04-23 DIAGNOSIS — M9902 Segmental and somatic dysfunction of thoracic region: Secondary | ICD-10-CM | POA: Diagnosis not present

## 2021-04-26 ENCOUNTER — Ambulatory Visit (INDEPENDENT_AMBULATORY_CARE_PROVIDER_SITE_OTHER): Payer: 59 | Admitting: Family Medicine

## 2021-04-26 ENCOUNTER — Other Ambulatory Visit (INDEPENDENT_AMBULATORY_CARE_PROVIDER_SITE_OTHER): Payer: Self-pay | Admitting: Family Medicine

## 2021-04-26 ENCOUNTER — Other Ambulatory Visit: Payer: Self-pay | Admitting: Physical Medicine and Rehabilitation

## 2021-04-26 ENCOUNTER — Other Ambulatory Visit: Payer: Self-pay

## 2021-04-26 ENCOUNTER — Encounter (INDEPENDENT_AMBULATORY_CARE_PROVIDER_SITE_OTHER): Payer: Self-pay | Admitting: Family Medicine

## 2021-04-26 VITALS — BP 112/75 | HR 85 | Temp 97.4°F | Ht 64.0 in | Wt 185.0 lb

## 2021-04-26 DIAGNOSIS — E559 Vitamin D deficiency, unspecified: Secondary | ICD-10-CM

## 2021-04-26 DIAGNOSIS — Z6831 Body mass index (BMI) 31.0-31.9, adult: Secondary | ICD-10-CM

## 2021-04-26 DIAGNOSIS — R7301 Impaired fasting glucose: Secondary | ICD-10-CM | POA: Diagnosis not present

## 2021-04-26 DIAGNOSIS — Z9189 Other specified personal risk factors, not elsewhere classified: Secondary | ICD-10-CM

## 2021-04-26 DIAGNOSIS — E669 Obesity, unspecified: Secondary | ICD-10-CM | POA: Diagnosis not present

## 2021-04-26 MED ORDER — TIRZEPATIDE 10 MG/0.5ML ~~LOC~~ SOAJ: 10.0000 mg | SUBCUTANEOUS | 0 refills | Status: DC

## 2021-04-30 NOTE — Progress Notes (Signed)
? ? ? ?Chief Complaint:  ? ?OBESITY ?[name] is here to discuss her progress with her obesity treatment plan along with follow-up of her obesity related diagnoses. [name] is on following a lower carbohydrate, vegetable and lean protein rich diet plan and states she is following her eating plan approximately 70-75% of the time. [name] states she is walking 1 mile 1-3 times per week.  ? ?Today's visit was #: 15 ?Starting weight: 201 lbs ?Starting date: 01/03/2020 ?Today's weight: 185 lbs ?Today's date: 04/26/2021 ?Total lbs lost to date: 73 ?Total lbs lost since last in-office visit: 3 ? ?Interim History:  ?This [name]'s first office visit with me, previously seen on 03/15/2021 and is a pt of  Dr. Juleen [name]. ? ?She was given a refill of Mounjaro in early February.  ? ?Snacks:  are nuts, cookies, and other treats her ADHD teenage son loves.  ? ?She is skipping meals and not eating much from the low carbohydrate plan she was previously prescribed by Dr Juleen [name]. ? ? ?Subjective:  ? ?1. Impaired fasting glucose- with polyphagia ?[name] is taking Mounjaro 7.5 mg dose, and could not get the 10 mg prior due to the pharmacy's stock issues with this med.  ? ?2. At risk for side effect of medication ?[name] is at risk for drug side effects due to increased dose of Mounjaro. ? ? ?Assessment/Plan:  ? ? ?Medications Discontinued During This Encounter  ?Medication Reason  ? tirzepatide (MOUNJARO) 7.5 MG/0.5ML Pen Dose change  ?  ? ?Meds ordered this encounter  ?Medications  ? tirzepatide [facility]) 10 MG/0.5ML Pen  ?  Sig: Inject 10 mg into the skin once a week.  ?  Dispense:  2 mL  ?  Refill:  0  ?  ? ?1. Impaired fasting glucose- with polyphagia ?[name] desires to increase Mounjaro to 10 mg weekly with no refills.  ?--> She is to not skip meals  ?--> decrease simple carbohydrates and stick to the plan.  ?- Discussed risks and benefits medications, Ozempic versus Mounjaro and likely effects of increasing dose etc. ? ?- tirzepatide (MOUNJARO)  10 MG/0.5ML Pen; Inject 10 mg into the skin once a week.  Dispense: 2 mL; Refill: 0 ? ?2. At risk for side effect of medication ?[name] was given approximately 9 minutes of drug side effect counseling today.  We discussed side effect possibility and risk versus benefits. [name] agreed to the medication and will contact this office if these side effects are intolerable. ? ?Repetitive spaced learning was employed today to elicit superior memory formation and behavioral change. ? ?3. Obesity BMI today is 31.8 ?[name] is currently in the action stage of change. As such, her goal is to continue with weight loss efforts. She has agreed to following a lower carbohydrate, vegetable and lean protein rich diet plan.  ? ?Handout was given and Low carbohydrate meal plan was reviewed with the patient. Encouraged to stick with the meal plan or possibly journaling would be best in the future. ? ?Exercise goals: For substantial health benefits, adults should do at least 150 minutes (2 hours and 30 minutes) a week of moderate-intensity, or 75 minutes (1 hour and 15 minutes) a week of vigorous-intensity aerobic physical activity, or an equivalent combination of moderate- and vigorous-intensity aerobic activity. Aerobic activity should be performed in episodes of at least 10 minutes, and preferably, it should be spread throughout the week. ? ?Behavioral modification strategies: increasing lean protein intake, decreasing simple carbohydrates, better snacking choices, and planning for success. ? ?  [name] has agreed to follow-up with our clinic in 2 weeks with Dr. Juleen [name]. She was informed of the importance of frequent follow-up visits to maximize her success with intensive lifestyle modifications for her multiple health conditions.  ? ?Objective:  ? ?Blood pressure 112/75, pulse 85, temperature (!) 97.4 ?F (36.3 ?C), height '5\' 4"'$  (1.626 m), weight 185 lb (83.9 kg), last menstrual period 01/22/2011, SpO2 96 %. ?Body mass index is 31.76  kg/m?. ? ?General: Cooperative, alert, well developed, in no acute distress. ?HEENT: Conjunctivae and lids unremarkable. ?Cardiovascular: Regular rhythm.  ?Lungs: Normal work of breathing. ?Neurologic: No focal deficits.  ? ?Lab Results  ?Component Value Date  ? CREATININE 1.04 (H) 04/19/2020  ? BUN 19 04/19/2020  ? NA 140 04/19/2020  ? K 3.7 04/19/2020  ? CL 104 04/19/2020  ? CO2 24 04/19/2020  ? ?Lab Results  ?Component Value Date  ? ALT 56 (H) 04/19/2020  ? AST 37 04/19/2020  ? ALKPHOS 61 04/19/2020  ? BILITOT 0.7 04/19/2020  ? ?Lab Results  ?Component Value Date  ? HGBA1C 5.1 01/03/2020  ? ?Lab Results  ?Component Value Date  ? INSULIN 11.6 01/03/2020  ? ?Lab Results  ?Component Value Date  ? TSH 3.920 01/03/2020  ? ?Lab Results  ?Component Value Date  ? CHOL 198 01/03/2020  ? HDL 52 01/03/2020  ? LDLCALC 126 (H) 01/03/2020  ? TRIG 114 01/03/2020  ? ?Lab Results  ?Component Value Date  ? VD25OH 35.0 01/03/2020  ? ?Lab Results  ?Component Value Date  ? WBC 5.9 04/19/2020  ? HGB 14.8 04/19/2020  ? HCT 42.5 04/19/2020  ? MCV 88.5 04/19/2020  ? PLT 207 04/19/2020  ? ?No results found for: IRON, TIBC, FERRITIN ? ?Attestation Statements:  ? ?Reviewed by clinician on day of visit: allergies, medications, problem list, medical history, surgical history, family history, social history, and previous encounter notes. ? ? ?I, Trixie Dredge, am acting as transcriptionist for [employer], DO. ? ?I have reviewed the above documentation for accuracy and completeness, and I agree with the above. Marjory Sneddon, D.O. ? ?The [place] was signed into law in 2016 which includes the topic of electronic health records.  This provides immediate access to information in MyChart.  This includes consultation notes, operative notes, office notes, lab results and pathology reports.  If you have any questions about what you read please let us know at your next visit so we can discuss your concerns and take corrective  action if need be.  We are right here with you. ? ? ?

## 2021-05-03 DIAGNOSIS — M542 Cervicalgia: Secondary | ICD-10-CM | POA: Diagnosis not present

## 2021-05-10 ENCOUNTER — Encounter: Payer: 59 | Attending: Physical Medicine and Rehabilitation | Admitting: Physical Medicine & Rehabilitation

## 2021-05-10 ENCOUNTER — Other Ambulatory Visit: Payer: Self-pay

## 2021-05-10 ENCOUNTER — Telehealth: Payer: Self-pay

## 2021-05-10 ENCOUNTER — Encounter: Payer: Self-pay | Admitting: Physical Medicine & Rehabilitation

## 2021-05-10 VITALS — BP 131/81 | HR 83 | Temp 98.0°F | Ht 64.0 in | Wt 185.6 lb

## 2021-05-10 DIAGNOSIS — R202 Paresthesia of skin: Secondary | ICD-10-CM | POA: Diagnosis not present

## 2021-05-10 DIAGNOSIS — R200 Anesthesia of skin: Secondary | ICD-10-CM | POA: Insufficient documentation

## 2021-05-10 NOTE — Progress Notes (Signed)
See EMG report in media  ? ?F/u with Dr Dagoberto Ligas to discuss finding in more detail ?

## 2021-05-15 DIAGNOSIS — E041 Nontoxic single thyroid nodule: Secondary | ICD-10-CM | POA: Diagnosis not present

## 2021-05-18 ENCOUNTER — Other Ambulatory Visit: Payer: Self-pay | Admitting: Physician Assistant

## 2021-05-18 ENCOUNTER — Ambulatory Visit
Admission: RE | Admit: 2021-05-18 | Discharge: 2021-05-18 | Disposition: A | Discharge status: Home / Self Care | Payer: 59 | Source: Ambulatory Visit | Attending: Physician Assistant | Admitting: Physician Assistant

## 2021-05-18 DIAGNOSIS — E041 Nontoxic single thyroid nodule: Secondary | ICD-10-CM

## 2021-05-24 DIAGNOSIS — E041 Nontoxic single thyroid nodule: Secondary | ICD-10-CM | POA: Diagnosis not present

## 2021-05-24 NOTE — Progress Notes (Signed)
?Charlann Boxer D.O. ?[name] ?[place] ?Phone: [phone] ?Subjective:   ?I, [name], am serving as a scribe for Dr. Hulan Saas. ? ?This visit occurred during the SARS-CoV-2 public health emergency.  Safety protocols were in place, including screening questions prior to the visit, additional usage of staff PPE, and extensive cleaning of exam room while observing appropriate contact time as indicated for disinfecting solutions.  ? ?I'm seeing this patient by the request  of:  Maury Dus, MD ? ?CC: Neck and low back pain ? ?[id number]  ?[name] is a 61 y.o. female coming in with complaint of L arm pain. Had EMG on 05/10/2021. Patient states that she pulled on a heavy box during Christmas and developed radicular symptoms into L arm and hand. Symptoms have subsided but she has minor irritation when sitting with L arm on armrest. Notes a pop in L shoulder when sleeping and symptoms started to improve.  Patient has been seen recently by PMR ? ?Patient has had lyme disease for over 30 years. History of DDD throughout entire spine.  ? ? ?  ? ?Past Medical History:  ?Diagnosis Date  ? Abnormal heart rate   ? Abnormal uterine bleeding (AUB)   ? Allergic rhinitis   ? Anemia   ? Anxiety   ? Asthma   ? B12 deficiency   ? Bruises easily   ? Cat-scratch disease   ? h/o  ? Chest pain   ? Chronic back pain   ? oa  ? Chronic fatigue syndrome   ? CMV (cytomegalovirus) ([place])   ? h/o  ? Constipation   ? Degenerative disc disease, cervical   ? and lumbar  ? Depression   ? Fibrocystic breast disease   ? Fibroids   ? Fibromyalgia   ? GERD (gastroesophageal reflux disease)   ? watches diet  ? H/O hiatal hernia   ? H/O myomectomy   ? Hashimoto thyroiditis   ? History of babesiosis   ? PARASITITIS RESOLVED  ? History of Bell's palsy   ? 2000--  resolved  ? History of concussion   ? age 42--  no residual  ? History of hyperparathyroidism   ? PRIMARY--  S/P PARATHYROIDECTOMY  01-28-2011  ? History of palpitations   ? stress induced  ? History of stomach ulcers   ? History of uterine fibroid   ? Hypoglycemia   ? Hypothyroidism   ? Joint pain   ? Kidney disease   ? 3A  ? Lyme disease   ? CHRONIC --  FOR 22 YRS (SINCE APPROX 1996)  AND HX CAT SCRATCH DISEASE WHICH HAS RESOLVED  ? Musculoskeletal chest pain   ? 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  ? Osteoarthritis   ? all joints secondary to lyme disease/  back is worse  ? Palpitations   ? SOB (shortness of breath)   ? Spinal stenosis   ? Swallowing difficulty   ? Vitamin B12 deficiency   ? Vitamin D deficiency   ? Wears contact lenses   ? ?Past Surgical History:  ?Procedure Laterality Date  ? BREAST BIOPSY Right 12/17/2018  ? benign  ? COLONOSCOPY  12/2017  ? DILATATION & CURRETTAGE/HYSTEROSCOPY WITH RESECTOCOPE N/A 12/02/2012  ? Procedure: [place];  Surgeon: Darlyn Chamber, MD;  Location: [facility];  Service: Gynecology;  Laterality: N/A;  ? GANGLION CYST EXCISION  2007  ? HAND  ? HERNIA REPAIR  2010  ?  Exploratory  ? HYSTEROSCOPIC MYOMECTOMY AND ABDOMINAL MYOMECTOMY  2006  ? PARATHYROIDECTOMY  01/28/2011  ? Procedure: PARATHYROIDECTOMY;  Surgeon: Earnstine Regal, MD;  Location: [facility];  Service: General;  Laterality: N/A;   ? STRESS ECHO REPORT  09-01-2009  DR [place]  ? NORMAL LVF/  EF 60%/  NORMAL HYPERDYNAMIC RESPONSE  ? TILT TABLE STUDY  08-21-2011    DR Caryl Comes  ? BP WERE CONSISTENT 110-117 THROUGH OUT STUDY/ BASELINE HR 68-70  ? TILT TABLE STUDY N/A 08/21/2011  ? Procedure: TILT TABLE STUDY;  Surgeon: Deboraha Sprang, MD;  Location: [facility] CATH LAB;  Service: Cardiovascular;  Laterality: N/A;  ? TONSILLECTOMY  age 72  ? ?Social History  ? ?Socioeconomic History  ? Marital status: Married  ?  Spouse name: Not on file  ? Number of children: Not on file  ? Years of education: 2 masters  ? Highest education level: Master's degree (e.g., MA, MS, MEng, MEd, MSW, MBA)  ?Occupational History  ? Not  on file  ?Tobacco Use  ? Smoking status: Never  ? Smokeless tobacco: Never  ?Vaping Use  ? Vaping Use: Never used  ?Substance and Sexual Activity  ? Alcohol use: Not Currently  ?  Comment: occasional wine   ? Drug use: No  ? Sexual activity: Not on file  ?Other Topics Concern  ? Not on file  ?Social History Narrative  ? Lives at home with her husband and her adopted baby   ? Right handed  ? Caffeine: 2 cups daily   ? ?Social Determinants of Health  ? ?Financial Resource Strain: Not on file  ?Food Insecurity: Not on file  ?Transportation Needs: Not on file  ?Physical Activity: Not on file  ?Stress: Not on file  ?Social Connections: Not on file  ? ?Allergies  ?Allergen Reactions  ? Iodinated Contrast Media Hives  ?  Other reaction(s): hives  ? Iodine Hives  ?  Other reaction(s): hives  ? Levothyroxine   ?  Poor therapeutic response ?Other reaction(s): poor therapeutic response  ? Metronidazole   ?  Other reaction(s): rash  ? Other   ?  Splenda  ? Tinidazole   ?  Other reaction(s): rash  ? Flagyl [Metronidazole Hcl] Rash  ? Tinidazole Rash  ? ?Family History  ?Problem Relation Age of Onset  ? Hypothyroidism Mother   ? Mitral valve prolapse Father   ? Hypertension Father   ? Dementia Father   ? Lupus Sister   ? Rheum arthritis Sister   ? Osteoarthritis Sister   ? Breast cancer Maternal Aunt   ?     76s  ? Cancer Maternal Aunt   ?     breast  ? Breast cancer Cousin   ?     maternal 23s  ? Breast cancer Cousin   ?     paternal 64s  ? Colon polyps Brother   ? Aneurysm Brother   ? Arthritis Brother   ? ? ?Current Outpatient Medications (Endocrine & Metabolic):  ?  ARMOUR THYROID 60 MG tablet, Take 60 mg by mouth daily. ?  hydrocortisone (CORTEF) 10 MG tablet,  ?  hydrocortisone (CORTEF) 5 MG tablet, Take by mouth. 1.5 pill am and 1/2 pill pm ?  tirzepatide (MOUNJARO) 10 MG/0.5ML Pen, Inject 10 mg into the skin once a week. ? ? ?Current Outpatient Medications (Respiratory):  ?  Fexofenadine HCl (ALLEGRA ALLERGY PO), Take by  mouth. ?  levalbuterol (XOPENEX HFA) 45 MCG/ACT inhaler, Inhale 1  puff into the lungs every 4 (four) hours as needed. For shortness of breath ? ?Current Outpatient Medications (Analgesics):  ?  traMADol (ULTRAM) 50 MG tablet, Take 1 tablet (50 mg total) by mouth every 6 (six) hours as needed. ? ? ?Current Outpatient Medications (Other):  ?  cyclobenzaprine (FLEXERIL) 10 MG tablet, TAKE 1 TABLET BY MOUTH THREE TIMES A DAY AS NEEDED FOR MUSCLE SPASMS ?  DULoxetine (CYMBALTA) 60 MG capsule, Take 60 mg by mouth every morning. ?  gabapentin (NEURONTIN) 300 MG capsule, TAKE ONE CAPSULE BY MOUTH AT BEDTIME DAILY. ?  Multiple Vitamin (MULTIVITAMIN PO), Take by mouth daily. ?  NALTREXONE HCL PO, Take 4 mg by mouth at bedtime. ?  Stevia, Stevia Rebaudiana, (STEVIA PO), Take by mouth daily. ?  traZODone (DESYREL) 50 MG tablet, Take 150 mg by mouth at bedtime. ?  valACYclovir (VALTREX) 500 MG tablet, Take 500 mg by mouth 2 (two) times daily. ?  NON FORMULARY, Methyline Blue ? ? ?Reviewed prior external information including notes and imaging from  ?primary care provider ?As well as notes that were available from care everywhere and other healthcare systems. ? ?Past medical history, social, surgical and family history all reviewed in electronic medical record.  No pertanent information unless stated regarding to the chief complaint.  ? ?Review of Systems: ? No headache, visual changes, nausea, vomiting, diarrhea, constipation, dizziness, abdominal pain, skin rash, fevers, chills, night sweats, weight loss, swollen lymph nodes, body aches, joint swelling, chest pain, shortness of breath, mood changes. POSITIVE muscle aches ? ?Objective  ?Blood pressure 118/82, pulse (!) 102, height '5\' 4"'$  (1.626 m), weight 186 lb (84.4 kg), last menstrual period 01/22/2011, SpO2 98 %. ?  ?General: No apparent distress alert and oriented x3 mood and affect normal, dressed appropriately.  ?HEENT: Pupils equal, extraocular movements intact   ?Respiratory: Patient's speak in full sentences and does not appear short of breath  ?Cardiovascular: No lower extremity edema, non tender, no erythema  ?Gait normal with good balance and coordination.  ?MSK:

## 2021-05-26 ENCOUNTER — Other Ambulatory Visit: Payer: Self-pay | Admitting: Physical Medicine and Rehabilitation

## 2021-05-26 ENCOUNTER — Other Ambulatory Visit (INDEPENDENT_AMBULATORY_CARE_PROVIDER_SITE_OTHER): Payer: Self-pay | Admitting: Family Medicine

## 2021-05-26 DIAGNOSIS — R7301 Impaired fasting glucose: Secondary | ICD-10-CM

## 2021-05-29 ENCOUNTER — Ambulatory Visit (INDEPENDENT_AMBULATORY_CARE_PROVIDER_SITE_OTHER): Payer: 59 | Admitting: Family Medicine

## 2021-05-29 ENCOUNTER — Encounter: Payer: Self-pay | Admitting: Family Medicine

## 2021-05-29 ENCOUNTER — Ambulatory Visit: Payer: Self-pay

## 2021-05-29 VITALS — BP 118/82 | HR 102 | Ht 64.0 in | Wt 186.0 lb

## 2021-05-29 DIAGNOSIS — M79602 Pain in left arm: Secondary | ICD-10-CM | POA: Diagnosis not present

## 2021-05-29 DIAGNOSIS — M5030 Other cervical disc degeneration, unspecified cervical region: Secondary | ICD-10-CM

## 2021-05-29 DIAGNOSIS — M797 Fibromyalgia: Secondary | ICD-10-CM | POA: Diagnosis not present

## 2021-05-29 DIAGNOSIS — M5136 Other intervertebral disc degeneration, lumbar region: Secondary | ICD-10-CM

## 2021-05-29 NOTE — Patient Instructions (Signed)
Vit D 2000IU daily ?Keep hands in peripheral vision ?See me in 6-8 weeks ?

## 2021-05-29 NOTE — Assessment & Plan Note (Signed)
I do believe patient does have some mild underlying arthritic changes of the neck noted.  Patient does have some weakness on noted on the left side with these radicular symptoms.  Patient will be started we will continue with the gabapentin.  She is on it at the moment.  Patient does have underlying fibromyalgia that makes it somewhat difficult as well.  I do think that she could be a potential candidate for osteopathic manipulation in the long run but first we will start with a home exercises.  Follow-up with me again in 6 to 8 weeks. ?

## 2021-05-30 ENCOUNTER — Ambulatory Visit (INDEPENDENT_AMBULATORY_CARE_PROVIDER_SITE_OTHER): Payer: 59 | Admitting: Family Medicine

## 2021-05-30 ENCOUNTER — Other Ambulatory Visit (INDEPENDENT_AMBULATORY_CARE_PROVIDER_SITE_OTHER): Payer: Self-pay | Admitting: Family Medicine

## 2021-05-30 ENCOUNTER — Encounter (INDEPENDENT_AMBULATORY_CARE_PROVIDER_SITE_OTHER): Payer: Self-pay | Admitting: Family Medicine

## 2021-05-30 VITALS — BP 130/82 | HR 79 | Temp 97.6°F | Ht 64.0 in | Wt 183.0 lb

## 2021-05-30 DIAGNOSIS — R7301 Impaired fasting glucose: Secondary | ICD-10-CM | POA: Diagnosis not present

## 2021-05-30 DIAGNOSIS — M797 Fibromyalgia: Secondary | ICD-10-CM | POA: Diagnosis not present

## 2021-05-30 DIAGNOSIS — K760 Fatty (change of) liver, not elsewhere classified: Secondary | ICD-10-CM | POA: Diagnosis not present

## 2021-05-30 DIAGNOSIS — E669 Obesity, unspecified: Secondary | ICD-10-CM | POA: Diagnosis not present

## 2021-05-30 DIAGNOSIS — Z6831 Body mass index (BMI) 31.0-31.9, adult: Secondary | ICD-10-CM

## 2021-05-30 MED ORDER — TIRZEPATIDE 10 MG/0.5ML ~~LOC~~ SOAJ: 10.0000 mg | SUBCUTANEOUS | 1 refills | Status: DC

## 2021-05-30 MED ORDER — TIRZEPATIDE 15 MG/0.5ML ~~LOC~~ SOAJ: 15.0000 mg | SUBCUTANEOUS | 1 refills | Status: DC

## 2021-05-30 MED ORDER — TIRZEPATIDE 12.5 MG/0.5ML ~~LOC~~ SOAJ: 12.5000 mg | SUBCUTANEOUS | 1 refills | Status: DC

## 2021-06-04 DIAGNOSIS — A6920 Lyme disease, unspecified: Secondary | ICD-10-CM | POA: Diagnosis not present

## 2021-06-04 DIAGNOSIS — R5383 Other fatigue: Secondary | ICD-10-CM | POA: Diagnosis not present

## 2021-06-05 NOTE — Progress Notes (Signed)
Chief Complaint:   OBESITY [name] is here to discuss her progress with her obesity treatment plan along with follow-up of her obesity related diagnoses. See Medical Weight Management Flowsheet for complete bioelectrical impedance results.  Today's visit was #: 16 Starting weight: 201 lbs Starting date: 01/03/2020 Weight change since last visit: 2 lbs Total lbs lost to date: 18 lbs Total weight loss percentage to date: -8.96%  Nutrition Plan: Lower carbohydrate, vegetable and lean protein rich diet plan for 5 of the time. Activity: Increased walking. Anti-obesity medications: Mounjaro 10 mg subcutaneously weekly. Reported side effects: None.  Interim History: [name] says she has been having tea and nuts in the evening and has been avoiding treats.  She says she has increased her water intake.  She is taking an adrenal live supplement that she got from her integrative medicine doctor.  She says it helps her with energy.  Assessment/Plan:   1. Impaired fasting glucose, with polyphagia Improving, but not optimized. Current treatment: Mounjaro 10 mg subcutaneously weekly.    Plan: Continue Mounjaro 10 mg subcutaneously weekly.  Will refill today, as per below.  Will also send in Mounjaro 12.5 mg and 15 mg doses.  She will continue to focus on protein-rich, low simple carbohydrate foods. We reviewed the importance of hydration, regular exercise for stress reduction, and restorative sleep.  - Refill tirzepatide (MOUNJARO) 10 MG/0.5ML Pen; Inject 10 mg into the skin once a week.  Dispense: 2 mL; Refill: 1 - Increase tirzepatide (MOUNJARO) 12.5 MG/0.5ML Pen; Inject 12.5 mg into the skin once a week.  Dispense: 2 mL; Refill: 1 - Increase tirzepatide (MOUNJARO) 15 MG/0.5ML Pen; Inject 15 mg into the skin once a week.  Dispense: 2 mL; Refill: 1  2. NAFLD (nonalcoholic fatty liver disease) Intensive lifestyle modifications are the first line treatment for this issue. We discussed several  lifestyle modifications today and she will continue to work on diet, exercise and weight loss efforts.   Counseling: NAFLD is an umbrella term that encompasses a disease spectrum that includes steatosis (fat) without inflammation, steatohepatitis (NASH; fat + inflammation in a characteristic pattern), and cirrhosis. Bland steatosis is felt to be a benign condition, with extremely low to no risk of progression to cirrhosis, whereas NASH can progress to cirrhosis. The mainstay of treatment of NAFLD includes lifestyle modification to achieve weight loss, at least 7% of current body weight. Low carbohydrate diets can be beneficial in improving NAFLD liver histology. Additionally, exercise, even the absence of weight loss can have beneficial effects on the patient's metabolic profile and liver health. We recommend that their metabolic comorbidities be aggressively managed, as patients with NAFLD are at increased risk of coronary artery disease.  3. Fibromyalgia Intensive lifestyle modifications are the first line treatment for this issue. We discussed several lifestyle modifications today and she will continue to work on diet, exercise and weight loss efforts.We will continue to monitor. Orders and follow up as documented in patient record.   Counseling Try [email or web address]/, which is a series of self-care modules designed to teach patients several techniques to manage pain.    4. Obesity BMI today is 31.5  Course: Amaranta is currently in the action stage of change. As such, her goal is to continue with weight loss efforts.   Nutrition goals: She has agreed to following a lower carbohydrate, vegetable and lean protein rich diet plan.   Exercise goals:  As is.  Behavioral modification strategies: increasing lean protein intake, decreasing simple carbohydrates, increasing  vegetables, and increasing water intake.  [name] has agreed to follow-up with our clinic in 6 weeks. She was  informed of the importance of frequent follow-up visits to maximize her success with intensive lifestyle modifications for her multiple health conditions.   Objective:   Blood pressure 130/82, pulse 79, temperature 97.6 F (36.4 C), temperature source Oral, height '5\' 4"'$  (1.626 m), weight 183 lb (83 kg), last menstrual period 01/22/2011, SpO2 96 %. Body mass index is 31.41 kg/m.  General: Cooperative, alert, well developed, in no acute distress. HEENT: Conjunctivae and lids unremarkable. Cardiovascular: Regular rhythm.  Lungs: Normal work of breathing. Neurologic: No focal deficits.   Lab Results  Component Value Date   CREATININE 1.04 (H) 04/19/2020   BUN 19 04/19/2020   NA 140 04/19/2020   K 3.7 04/19/2020   CL 104 04/19/2020   CO2 24 04/19/2020   Lab Results  Component Value Date   ALT 56 (H) 04/19/2020   AST 37 04/19/2020   ALKPHOS 61 04/19/2020   BILITOT 0.7 04/19/2020   Lab Results  Component Value Date   HGBA1C 5.1 01/03/2020   Lab Results  Component Value Date   INSULIN 11.6 01/03/2020   Lab Results  Component Value Date   TSH 3.920 01/03/2020   Lab Results  Component Value Date   CHOL 198 01/03/2020   HDL 52 01/03/2020   LDLCALC 126 (H) 01/03/2020   TRIG 114 01/03/2020   Lab Results  Component Value Date   VD25OH 35.0 01/03/2020   Lab Results  Component Value Date   WBC 5.9 04/19/2020   HGB 14.8 04/19/2020   HCT 42.5 04/19/2020   MCV 88.5 04/19/2020   PLT 207 04/19/2020   Attestation Statements:   Reviewed by clinician on day of visit: allergies, medications, problem list, medical history, surgical history, family history, social history, and previous encounter notes.  I, Water quality scientist, CMA, am acting as transcriptionist for Briscoe Deutscher, DO  I have reviewed the above documentation for accuracy and completeness, and I agree with the above. -  Briscoe Deutscher, DO, MS, FAAFP, DABOM - Family and Bariatric Medicine.

## 2021-06-12 ENCOUNTER — Telehealth (INDEPENDENT_AMBULATORY_CARE_PROVIDER_SITE_OTHER): Payer: Self-pay

## 2021-06-12 NOTE — Telephone Encounter (Signed)
Attempted to contact pt without success re c/o side effects to mounjaro. LMOM for pt to return call. Mychart message also will be sent in an attempt to reach pt. ?

## 2021-06-14 ENCOUNTER — Encounter (INDEPENDENT_AMBULATORY_CARE_PROVIDER_SITE_OTHER): Payer: Self-pay | Admitting: Physician Assistant

## 2021-06-14 ENCOUNTER — Ambulatory Visit (INDEPENDENT_AMBULATORY_CARE_PROVIDER_SITE_OTHER): Payer: 59 | Admitting: Physician Assistant

## 2021-06-14 VITALS — BP 108/76 | Ht 64.0 in | Wt 179.0 lb

## 2021-06-14 DIAGNOSIS — R7301 Impaired fasting glucose: Secondary | ICD-10-CM

## 2021-06-14 DIAGNOSIS — Z6831 Body mass index (BMI) 31.0-31.9, adult: Secondary | ICD-10-CM

## 2021-06-14 DIAGNOSIS — Z9189 Other specified personal risk factors, not elsewhere classified: Secondary | ICD-10-CM

## 2021-06-14 DIAGNOSIS — E669 Obesity, unspecified: Secondary | ICD-10-CM | POA: Diagnosis not present

## 2021-06-14 MED ORDER — TIRZEPATIDE 10 MG/0.5ML ~~LOC~~ SOAJ: 10.0000 mg | SUBCUTANEOUS | 0 refills | Status: DC

## 2021-06-18 ENCOUNTER — Encounter (INDEPENDENT_AMBULATORY_CARE_PROVIDER_SITE_OTHER): Payer: Self-pay | Admitting: Family Medicine

## 2021-06-18 NOTE — Telephone Encounter (Signed)
Please advise 

## 2021-06-19 ENCOUNTER — Encounter (INDEPENDENT_AMBULATORY_CARE_PROVIDER_SITE_OTHER): Payer: Self-pay

## 2021-06-19 ENCOUNTER — Telehealth (INDEPENDENT_AMBULATORY_CARE_PROVIDER_SITE_OTHER): Payer: Self-pay | Admitting: Physician Assistant

## 2021-06-19 NOTE — Telephone Encounter (Signed)
FYI

## 2021-06-19 NOTE — Telephone Encounter (Signed)
Prior authorization denied for [facility]. Patient already uses copay card. Patient sent denial message via mychart.  ?

## 2021-06-24 NOTE — Progress Notes (Signed)
? ? ? ?Chief Complaint:  ? ?OBESITY ?[name] is here to discuss her progress with her obesity treatment plan along with follow-up of her obesity related diagnoses. [name] is on following a lower carbohydrate, vegetable and lean protein rich diet plan and states she is following her eating plan approximately 30% of the time. [name] states she is walking 1 mile 3 times per week.   ? ?Today's visit was #: 54 ?Starting weight: 201 lbs ?Starting date: 01/03/2020 ?Today's weight: 179 lbs ?Today's date: 06/14/2021 ?Total lbs lost to date: 43 ?Total lbs lost since last in-office visit: 4 ? ?Interim History: [name] was at the beach last week and she did not eat the low carb plan. Her husband always eats carbs at dinner, which can sometimes pose a challenge. She is drinking 64 oz of water daily. She notes some boredom with breakfast and she wants some low carb options. ? ?Subjective:  ? ?1. Impaired fasting glucose, with polyphagia ?[name] had some nausea and vomiting with Mounjaro at 12.5 mg for the first 2 days after her injection. ? ?2. At risk for side effect of medication ?[name] is at risk for drug side effects due to [name].  ? ?Assessment/Plan:  ? ?1. Impaired fasting glucose, with polyphagia ?[name] agreed to decrease Mounjaro to 10 mg q week, with no refills.  ? ?- tirzepatide (MOUNJARO) 10 MG/0.5ML Pen; Inject 10 mg into the skin once a week.  Dispense: 2 mL; Refill: 0 ? ?2. At risk for side effect of medication ?[name] was given approximately 15 minutes of drug side effect counseling today.  We discussed side effect possibility and risk versus benefits. [name] agreed to the medication and will contact this office if these side effects are intolerable. ? ?Repetitive spaced learning was employed today to elicit superior memory formation and behavioral change. ? ?3. Obesity BMI today is 31.5 ?[name] is currently in the action stage of change. As such, her goal is to continue with weight loss efforts. She has agreed to keeping  a food journal and adhering to recommended goals of 1300-1500 calories and 95 grams of protein daily.  ? ?Exercise goals: As is. ? ?Behavioral modification strategies: meal planning and cooking strategies and keeping healthy foods in the home. ? ?[name] will be following up with Dr. Alcario Drought office. No need for a follow up with [name]. She was informed of the importance of frequent follow-up visits to maximize her success with intensive lifestyle modifications for her multiple health conditions.  ? ?Objective:  ? ?Blood pressure 108/76, height '5\' 4"'$  (1.626 m), weight 179 lb (81.2 kg), last menstrual period 01/22/2011. ?Body mass index is 30.73 kg/m?. ? ?General: Cooperative, alert, well developed, in no acute distress. ?HEENT: Conjunctivae and lids unremarkable. ?Cardiovascular: Regular rhythm.  ?Lungs: Normal work of breathing. ?Neurologic: No focal deficits.  ? ?Lab Results  ?Component Value Date  ? CREATININE 1.04 (H) 04/19/2020  ? BUN 19 04/19/2020  ? NA 140 04/19/2020  ? K 3.7 04/19/2020  ? CL 104 04/19/2020  ? CO2 24 04/19/2020  ? ?Lab Results  ?Component Value Date  ? ALT 56 (H) 04/19/2020  ? AST 37 04/19/2020  ? ALKPHOS 61 04/19/2020  ? BILITOT 0.7 04/19/2020  ? ?Lab Results  ?Component Value Date  ? HGBA1C 5.1 01/03/2020  ? ?Lab Results  ?Component Value Date  ? INSULIN 11.6 01/03/2020  ? ?Lab Results  ?Component Value Date  ? TSH 3.920 01/03/2020  ? ?Lab Results  ?Component Value Date  ? CHOL  198 01/03/2020  ? HDL 52 01/03/2020  ? LDLCALC 126 (H) 01/03/2020  ? TRIG 114 01/03/2020  ? ?Lab Results  ?Component Value Date  ? VD25OH 35.0 01/03/2020  ? ?Lab Results  ?Component Value Date  ? WBC 5.9 04/19/2020  ? HGB 14.8 04/19/2020  ? HCT 42.5 04/19/2020  ? MCV 88.5 04/19/2020  ? PLT 207 04/19/2020  ? ?No results found for: IRON, TIBC, FERRITIN ? ?Attestation Statements:  ? ?Reviewed by clinician on day of visit: allergies, medications, problem list, medical history, surgical history, family history, social history,  and previous encounter notes. ? ? ?I, Trixie Dredge, am acting as transcriptionist for Abby Potash, PA-C. ? ?I have reviewed the above documentation for accuracy and completeness, and I agree with the above. Abby Potash, PA-C ? ? ?

## 2021-06-25 ENCOUNTER — Encounter: Payer: Self-pay | Admitting: Physical Medicine and Rehabilitation

## 2021-06-25 ENCOUNTER — Encounter (INDEPENDENT_AMBULATORY_CARE_PROVIDER_SITE_OTHER): Payer: Self-pay | Admitting: Family Medicine

## 2021-06-25 ENCOUNTER — Encounter: Payer: 59 | Attending: Physical Medicine and Rehabilitation | Admitting: Physical Medicine and Rehabilitation

## 2021-06-25 VITALS — BP 113/80 | HR 94 | Ht 64.0 in | Wt 185.0 lb

## 2021-06-25 DIAGNOSIS — G894 Chronic pain syndrome: Secondary | ICD-10-CM | POA: Insufficient documentation

## 2021-06-25 DIAGNOSIS — E063 Autoimmune thyroiditis: Secondary | ICD-10-CM | POA: Diagnosis not present

## 2021-06-25 DIAGNOSIS — Z5181 Encounter for therapeutic drug level monitoring: Secondary | ICD-10-CM | POA: Insufficient documentation

## 2021-06-25 DIAGNOSIS — M797 Fibromyalgia: Secondary | ICD-10-CM | POA: Insufficient documentation

## 2021-06-25 DIAGNOSIS — M1990 Unspecified osteoarthritis, unspecified site: Secondary | ICD-10-CM | POA: Insufficient documentation

## 2021-06-25 DIAGNOSIS — J45909 Unspecified asthma, uncomplicated: Secondary | ICD-10-CM | POA: Diagnosis not present

## 2021-06-25 DIAGNOSIS — M545 Low back pain, unspecified: Secondary | ICD-10-CM | POA: Insufficient documentation

## 2021-06-25 DIAGNOSIS — M7918 Myalgia, other site: Secondary | ICD-10-CM

## 2021-06-25 DIAGNOSIS — Z79891 Long term (current) use of opiate analgesic: Secondary | ICD-10-CM | POA: Diagnosis not present

## 2021-06-25 MED ORDER — TRAMADOL HCL 50 MG PO TABS: 50.0000 mg | ORAL_TABLET | Freq: Four times a day (QID) | ORAL | 5 refills | Status: DC | PRN

## 2021-06-25 NOTE — Patient Instructions (Signed)
Plan: ?Patient here for trigger point injections for ? Consent done and on chart. ? ?Cleaned areas with alcohol and injected using a 27 gauge 1.5 inch needle ? ?Injected 4cc ?Using 1% Lidocaine with no EPI ? ?Upper traps ?Levators ?Posterior scalenes ?Middle scalenes ?Splenius Capitus ?Pectoralis Major ?Rhomboids B/L x2 ?Infraspinatus ?Teres Major/minor ?Thoracic paraspinals B/L x2- also under bra strap ?Lumbar paraspinals B/L  ?Other injections-  ? ? ?Patient's level of pain prior was 7/10 ?Current level of pain after injections is- 5/10 -relaxing.  ? ?There was no bleeding or complications. ? ?Patient was advised to drink a lot of water on day after injections to flush system ?Will have increased soreness for 12-48 hours after injections.  ?Can use Lidocaine patches the day AFTER injections ?Can use theracane on day of injections in places didn't inject ?Can use heating pad 4-6 hours AFTER injections  ? ? ?2. Needs refills on tramadol 50 mg q6 hours prn-  Send into pharmacy-  ? ?3. Con't gabapentin scheduled and Flexeril prn- doesn't need refills today.  ? ?4. UDS today since on Tramadol. Need to get within 24 hours.  ? ?5. F/U in 3 months - probable trigger point injections.  ?

## 2021-06-25 NOTE — Progress Notes (Signed)
Pt is a 61 yr old female (with birthday yesterday)  with hx of asthma, DDD, DJD,  Cat scratch disease, Hashimoto's thyroiditis,Lyme's disease, EBV, chronic low back pain , fibromyalgia, and generalized chronic pain here for f/u on chronic pain issues.  ? ?Got EMG/NCS on 05/10/21 by Dr Letta Pate.  ? ?Has B/L carpal tunnel syndrome based on EMG ?Ortho sent to PT and did these neck exercises and palmar numbness has gotten better/resolved.  ? ?Things going OK otherwise.  ? ?Pain- really bad last week- due to bad weather. ?Son also sick last week.  ? ?Cold makes pain much worse- "can't wait for it to go away".  ?Took an extra Tramadol at night, so sore- couldn't find sot to lay/be comfortable.  ? ?Takes tramadol 1 tab 2x/day usually- but occ takes 2 tabs when pain worse.  ? ? ? ?Plan: ?Patient here for trigger point injections for ? Consent done and on chart. ? ?Cleaned areas with alcohol and injected using a 27 gauge 1.5 inch needle ? ?Injected 4cc ?Using 1% Lidocaine with no EPI ? ?Upper traps ?Levators ?Posterior scalenes ?Middle scalenes ?Splenius Capitus ?Pectoralis Major ?Rhomboids B/L x2 ?Infraspinatus ?Teres Major/minor ?Thoracic paraspinals B/L x2- also under bra strap ?Lumbar paraspinals B/L  ?Other injections-  ? ? ?Patient's level of pain prior was 7/10 ?Current level of pain after injections is- 5/10 -relaxing.  ? ?There was no bleeding or complications. ? ?Patient was advised to drink a lot of water on day after injections to flush system ?Will have increased soreness for 12-48 hours after injections.  ?Can use Lidocaine patches the day AFTER injections ?Can use theracane on day of injections in places didn't inject ?Can use heating pad 4-6 hours AFTER injections  ? ? ?2. Needs refills on tramadol 50 mg q6 hours prn-  Send into pharmacy-  ? ?3. Con't gabapentin scheduled and Flexeril prn- doesn't need refills today.  ? ?4. UDS today since on Tramadol. Need to get within 24 hours.  ? ?5. F/U in 3 months -  probable trigger point injections.  ? ? ?I spent a total of  25   minutes on total care today- >50% coordination of care- due to 10 minutes on  injections and 7 minutes on rest of appt.  ? ?

## 2021-06-26 ENCOUNTER — Other Ambulatory Visit (INDEPENDENT_AMBULATORY_CARE_PROVIDER_SITE_OTHER): Payer: Self-pay | Admitting: Bariatrics

## 2021-06-26 MED ORDER — TIRZEPATIDE 12.5 MG/0.5ML ~~LOC~~ SOAJ: 12.5000 mg | SUBCUTANEOUS | 0 refills | Status: AC

## 2021-07-01 LAB — TOXASSURE SELECT,+ANTIDEPR,UR

## 2021-07-05 ENCOUNTER — Telehealth: Payer: Self-pay | Admitting: *Deleted

## 2021-07-05 NOTE — Telephone Encounter (Signed)
Urine drug screen for this encounter is consistent for prescribed medication 

## 2021-07-10 NOTE — Progress Notes (Deleted)
[name] Phone: [phone] Subjective:    I'm seeing this patient by the request  of:  Maury Dus, MD  CC:   [id number]  05/29/2021 I do believe patient does have some mild underlying arthritic changes of the neck noted.  Patient does have some weakness on noted on the left side with these radicular symptoms.  Patient will be started we will continue with the gabapentin.  She is on it at the moment.  Patient does have underlying fibromyalgia that makes it somewhat difficult as well.  I do think that she could be a potential candidate for osteopathic manipulation in the long run but first we will start with a home exercises.  Follow-up with me again in 6 to 8 weeks.  Update 07/16/2021 [name] is a 61 y.o. female coming in with complaint of cervical and lumbar spine pain. Patient states       Past Medical History:  Diagnosis Date   Abnormal heart rate    Abnormal uterine bleeding (AUB)    Allergic rhinitis    Anemia    Anxiety    Asthma    B12 deficiency    Bruises easily    Cat-scratch disease    h/o   Chest pain    Chronic back pain    oa   Chronic fatigue syndrome    CMV (cytomegalovirus) (HCC)    h/o   Constipation    Degenerative disc disease, cervical    and lumbar   Depression    Fibrocystic breast disease    Fibroids    Fibromyalgia    GERD (gastroesophageal reflux disease)    watches diet   H/O hiatal hernia    H/O myomectomy    Hashimoto thyroiditis    History of babesiosis    PARASITITIS RESOLVED   History of Bell's palsy    2000--  resolved   History of concussion    age 62--  no residual   History of hyperparathyroidism    PRIMARY--  S/P PARATHYROIDECTOMY 01-28-2011   History of palpitations    stress induced   History of stomach ulcers    History of uterine fibroid    Hypoglycemia    Hypothyroidism    Joint pain    Kidney disease    3A   Lyme disease     CHRONIC --  FOR 22 YRS (SINCE APPROX 1996)  AND HX CAT SCRATCH DISEASE WHICH HAS RESOLVED   Musculoskeletal chest pain    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   Osteoarthritis    all joints secondary to lyme disease/  back is worse   Palpitations    SOB (shortness of breath)    Spinal stenosis    Swallowing difficulty    Vitamin B12 deficiency    Vitamin D deficiency    Wears contact lenses    Past Surgical History:  Procedure Laterality Date   BREAST BIOPSY Right 12/17/2018   benign   COLONOSCOPY  12/2017   DILATATION & CURRETTAGE/HYSTEROSCOPY WITH RESECTOCOPE N/A 12/02/2012   Procedure: DILATATION & CURETTAGE/HYSTEROSCOPY WITH RESECTOCOPE;  Surgeon: Darlyn Chamber, MD;  Location: [place];  Service: Gynecology;  Laterality: N/A;   GANGLION CYST EXCISION  2007   HAND   HERNIA REPAIR  2010   Exploratory   HYSTEROSCOPIC MYOMECTOMY AND ABDOMINAL MYOMECTOMY  2006   PARATHYROIDECTOMY  01/28/2011   Procedure: PARATHYROIDECTOMY;  Surgeon: Earnstine Regal, MD;  Location:  [facility];  Service: General;  Laterality: N/A;    STRESS ECHO REPORT  09-01-2009  DR [place]   NORMAL LVF/  EF 60%/  NORMAL HYPERDYNAMIC RESPONSE   TILT TABLE STUDY  08-21-2011    DR KLEIN   BP WERE CONSISTENT 110-117 THROUGH OUT STUDY/ BASELINE HR 68-70   TILT TABLE STUDY N/A 08/21/2011   Procedure: TILT TABLE STUDY;  Surgeon: Deboraha Sprang, MD;  Location: [facility] CATH LAB;  Service: Cardiovascular;  Laterality: N/A;   TONSILLECTOMY  age 68   Social History   Socioeconomic History   Marital status: Married    Spouse name: Not on file   Number of children: Not on file   Years of education: 2 masters   Highest education level: Master's degree (e.g., MA, MS, MEng, MEd, MSW, MBA)  Occupational History   Not on file  Tobacco Use   Smoking status: Never   Smokeless tobacco: Never  Vaping Use   Vaping Use: Never used  Substance and Sexual Activity   Alcohol use: Not Currently    Comment: occasional wine     Drug use: No   Sexual activity: Not on file  Other Topics Concern   Not on file  Social History Narrative   Lives at home with her husband and her adopted baby    Right handed   Caffeine: 2 cups daily    Social Determinants of Health   Financial Resource Strain: Not on file  Food Insecurity: Not on file  Transportation Needs: Not on file  Physical Activity: Not on file  Stress: Not on file  Social Connections: Not on file   Allergies  Allergen Reactions   Iodinated Contrast Media Hives    Other reaction(s): hives   Iodine Hives    Other reaction(s): hives   Levothyroxine     Poor therapeutic response Other reaction(s): poor therapeutic response   Metronidazole     Other reaction(s): rash   Other     Splenda   Tinidazole     Other reaction(s): rash   Flagyl [Metronidazole Hcl] Rash   Tinidazole Rash   Family History  Problem Relation Age of Onset   Hypothyroidism Mother    Mitral valve prolapse Father    Hypertension Father    Dementia Father    Lupus Sister    Rheum arthritis Sister    Osteoarthritis Sister    Breast cancer Maternal Aunt        81s   Cancer Maternal Aunt        breast   Breast cancer Cousin        maternal 41s   Breast cancer Cousin        paternal 71s   Colon polyps Brother    Aneurysm Brother    Arthritis Brother     Current Outpatient Medications (Endocrine & Metabolic):    ARMOUR THYROID 60 MG tablet, Take 60 mg by mouth daily.   hydrocortisone (CORTEF) 10 MG tablet,    hydrocortisone (CORTEF) 5 MG tablet, Take by mouth. 1.5 pill am and 1/2 pill pm   tirzepatide (MOUNJARO) 12.5 MG/0.5ML Pen, Inject 12.5 mg into the skin once a week.   Current Outpatient Medications (Respiratory):    levalbuterol (XOPENEX HFA) 45 MCG/ACT inhaler, Inhale 1 puff into the lungs every 4 (four) hours as needed. For shortness of breath  Current Outpatient Medications (Analgesics):    traMADol (ULTRAM) 50 MG tablet, Take 1 tablet (50 mg total) by mouth  every 6 (  six) hours as needed.   Current Outpatient Medications (Other):    cyclobenzaprine (FLEXERIL) 10 MG tablet, TAKE 1 TABLET BY MOUTH THREE TIMES A DAY AS NEEDED FOR MUSCLE SPASMS   DULoxetine (CYMBALTA) 60 MG capsule, Take 60 mg by mouth every morning.   gabapentin (NEURONTIN) 300 MG capsule, TAKE ONE CAPSULE BY MOUTH AT BEDTIME DAILY.   Multiple Vitamin (MULTIVITAMIN PO), Take by mouth daily.   NALTREXONE HCL PO, Take 4 mg by mouth at bedtime.   Stevia, Stevia Rebaudiana, (STEVIA PO), Take by mouth daily.   traZODone (DESYREL) 50 MG tablet, Take 150 mg by mouth at bedtime.   valACYclovir (VALTREX) 500 MG tablet, Take 500 mg by mouth 2 (two) times daily.   Reviewed prior external information including notes and imaging from  primary care provider As well as notes that were available from care everywhere and other healthcare systems.  Past medical history, social, surgical and family history all reviewed in electronic medical record.  No pertanent information unless stated regarding to the chief complaint.   Review of Systems:  No headache, visual changes, nausea, vomiting, diarrhea, constipation, dizziness, abdominal pain, skin rash, fevers, chills, night sweats, weight loss, swollen lymph nodes, body aches, joint swelling, chest pain, shortness of breath, mood changes. POSITIVE muscle aches  Objective  Last menstrual period 01/22/2011.   General: No apparent distress alert and oriented x3 mood and affect normal, dressed appropriately.  HEENT: Pupils equal, extraocular movements intact  Respiratory: Patient's speak in full sentences and does not appear short of breath  Cardiovascular: No lower extremity edema, non tender, no erythema  Gait normal with good balance and coordination.  MSK:  Non tender with full range of motion and good stability and symmetric strength and tone of shoulders, elbows, wrist, hip, knee and ankles bilaterally.     Impression and Recommendations:      The above documentation has been reviewed and is accurate and complete [name]

## 2021-07-16 ENCOUNTER — Ambulatory Visit: Payer: 59 | Admitting: Family Medicine

## 2021-07-25 DIAGNOSIS — A6920 Lyme disease, unspecified: Secondary | ICD-10-CM | POA: Diagnosis not present

## 2021-07-25 DIAGNOSIS — R5383 Other fatigue: Secondary | ICD-10-CM | POA: Diagnosis not present

## 2021-08-15 DIAGNOSIS — E8881 Metabolic syndrome: Secondary | ICD-10-CM | POA: Diagnosis not present

## 2021-10-03 ENCOUNTER — Encounter (INDEPENDENT_AMBULATORY_CARE_PROVIDER_SITE_OTHER): Payer: Self-pay

## 2021-10-15 ENCOUNTER — Encounter: Payer: 59 | Attending: Physical Medicine and Rehabilitation | Admitting: Physical Medicine and Rehabilitation

## 2021-10-15 ENCOUNTER — Encounter: Payer: Self-pay | Admitting: Physical Medicine and Rehabilitation

## 2021-10-15 VITALS — BP 147/91 | HR 107 | Ht 64.0 in | Wt 177.6 lb

## 2021-10-15 DIAGNOSIS — Z8619 Personal history of other infectious and parasitic diseases: Secondary | ICD-10-CM | POA: Insufficient documentation

## 2021-10-15 DIAGNOSIS — M7918 Myalgia, other site: Secondary | ICD-10-CM | POA: Insufficient documentation

## 2021-10-15 DIAGNOSIS — M797 Fibromyalgia: Secondary | ICD-10-CM | POA: Insufficient documentation

## 2021-10-15 MED ORDER — LIDOCAINE HCL 1 % IJ SOLN: 3.0000 mL | Freq: Once | INTRAMUSCULAR | Status: AC

## 2021-10-15 NOTE — Patient Instructions (Signed)
Pt is a 61 yr old female  with hx of asthma, DDD, DJD,  Cat scratch disease, Hashimoto's thyroiditis,Lyme's disease, EBV, chronic low back pain , fibromyalgia, and generalized chronic pain here for f/u on chronic pain issues.   See if she can get in somewhere for Glutothione shots- have been real helpful for her in past. Looked up near [name], and had multiple places that do. Mainly spa locations.  2. Patient here for trigger point injections for  Consent done and on chart.  Cleaned areas with alcohol and injected using a 27 gauge 1.5 inch needle  Injected  3cc Using 1% Lidocaine with no EPI  Upper traps B/L  Levators- B/L  Posterior scalenes Middle scalenes- B/L  Splenius Capitus Pectoralis Major Rhomboids B/L x2 Infraspinatus Teres Major/minor Thoracic paraspinals Lumbar paraspinals Other injections-    Patient's level of pain prior was 8/10 Current level of pain after injections is 6/10  There was no bleeding or complications.  Patient was advised to drink a lot of water on day after injections to flush system Will have increased soreness for 12-48 hours after injections.  Can use Lidocaine patches the day AFTER injections Can use theracane on day of injections in places didn't inject Can use heating pad 4-6 hours AFTER injections   3. Doesn't need refills on Low dose naltrexone, Flexeril Tramadol and Trazodone- but will continue these meds.    4. F/U in 3 months for TrP injections and f/u on chronic pain.

## 2021-10-15 NOTE — Progress Notes (Signed)
Subjective:    Patient ID: [name], female    DOB: January 13, 1961, 61 y.o.   MRN: [id number]  HPI Pt is a 61 yr old female  with hx of asthma, DDD, DJD,  Cat scratch disease, Hashimoto's thyroiditis,Lyme's disease, EBV, chronic low back pain , fibromyalgia, and generalized chronic pain here for f/u on chronic pain issues.    Been having reaction to toxins- SOT treatment- brings ot Lyme's and cannot replicate- 1 band on western blot- so hoping this will help.   Alka seltzer gold- helps clear out toxins from treatment.  Did SOT treatment 1 week ago Friday. Never had Sx's from SOT treatment prior- but did this time.   And went to [place]- where get treatment went to [facility].   Got nose cauterized last week- because was having nose bleeds (from Lyme's disease).   Pain level esp bad on upper back  R>L and B/L legs.  Wants to wait on Trigger point injections. Hurting so bad, doesn't want to .    Last night- had so much pain- finally had to take Flexeril to get to sleep-  probably took 4 tramadol yesterday due to increased Sx's.   Hasn't had too many muscle cramps, so has a lot of flexeril left.  Gets cramps more when cold; even when went swimming at [facility] lodge.   Still on Duloxetine 60 mg daily- and Gabapentin - gabapentin QHS 300 mg- and no side effects/issues from those.   Still takes Trazodone 150 mg QHS for sleep.  LDN-  Has a full Rx on Low dose naltrexone 4 mg nightly.   Has received Glutothione shots for toxin pain in past- works really fast.     Pain Inventory Average Pain 7 Pain Right Now 8 My pain is constant, burning, dull, stabbing, and aching  In the last 24 hours, has pain interfered with the following? General activity 8 Relation with others 9 Enjoyment of life 8 What TIME of day is your pain at its worst? morning  and evening Sleep (in general) Fair  Pain is worse with: bending and sitting Pain improves with: rest, heat/ice,  therapy/exercise, medication, and injections Relief from Meds: 8  Family History  Problem Relation Age of Onset   Hypothyroidism Mother    Mitral valve prolapse Father    Hypertension Father    Dementia Father    Lupus Sister    Rheum arthritis Sister    Osteoarthritis Sister    Breast cancer Maternal Aunt        49s   Cancer Maternal Aunt        breast   Breast cancer Cousin        maternal 42s   Breast cancer Cousin        paternal 44s   Colon polyps Brother    Aneurysm Brother    Arthritis Brother    Social History   Socioeconomic History   Marital status: Married    Spouse name: Not on file   Number of children: Not on file   Years of education: 2 masters   Highest education level: Master's degree (e.g., MA, MS, MEng, MEd, MSW, MBA)  Occupational History   Not on file  Tobacco Use   Smoking status: Never   Smokeless tobacco: Never  Vaping Use   Vaping Use: Never used  Substance and Sexual Activity   Alcohol use: Not Currently    Comment: occasional wine    Drug use: No  Sexual activity: Not on file  Other Topics Concern   Not on file  Social History Narrative   Lives at home with her husband and her adopted baby    Right handed   Caffeine: 2 cups daily    Social Determinants of Health   Financial Resource Strain: Not on file  Food Insecurity: Not on file  Transportation Needs: Not on file  Physical Activity: Not on file  Stress: Not on file  Social Connections: Not on file   Past Surgical History:  Procedure Laterality Date   BREAST BIOPSY Right 12/17/2018   benign   COLONOSCOPY  12/2017   DILATATION & CURRETTAGE/HYSTEROSCOPY WITH RESECTOCOPE N/A 12/02/2012   Procedure: [place];  Surgeon: Darlyn Chamber, MD;  Location: [place];  Service: Gynecology;  Laterality: N/A;   GANGLION CYST EXCISION  2007   HAND   HERNIA REPAIR  2010   Exploratory   HYSTEROSCOPIC MYOMECTOMY AND ABDOMINAL  MYOMECTOMY  2006   PARATHYROIDECTOMY  01/28/2011   Procedure: PARATHYROIDECTOMY;  Surgeon: Earnstine Regal, MD;  Location: [facility];  Service: General;  Laterality: N/A;    STRESS ECHO REPORT  09-01-2009  DR [place]   NORMAL LVF/  EF 60%/  NORMAL HYPERDYNAMIC RESPONSE   TILT TABLE STUDY  08-21-2011    DR KLEIN   BP WERE CONSISTENT 110-117 THROUGH OUT STUDY/ BASELINE HR 68-70   TILT TABLE STUDY N/A 08/21/2011   Procedure: TILT TABLE STUDY;  Surgeon: Deboraha Sprang, MD;  Location: [facility] C[facility];  Service: Cardiovascular;  Laterality: N/A;   TONSILLECTOMY  age 55   Past Surgical History:  Procedure Laterality Date   BREAST BIOPSY Right 12/17/2018   benign   COLONOSCOPY  12/2017   DILATATION & CURRETTAGE/HYSTEROSCOPY WITH RESECTOCOPE N/A 12/02/2012   Procedure: DILATATION & CURETTAGE/HYSTEROSCOPY WITH RESECTOCOPE;  Surgeon: Darlyn Chamber, MD;  Location: [facility];  Service: Gynecology;  Laterality: N/A;   GANGLION CYST EXCISION  2007   HAND   HERNIA REPAIR  2010   Exploratory   HYSTEROSCOPIC MYOMECTOMY AND ABDOMINAL MYOMECTOMY  2006   PARATHYROIDECTOMY  01/28/2011   Procedure: PARATHYROIDECTOMY;  Surgeon: Earnstine Regal, MD;  Location: [facility];  Service: General;  Laterality: N/A;    STRESS ECHO REPORT  09-01-2009  DR [place]   NORMAL LVF/  EF 60%/  NORMAL HYPERDYNAMIC RESPONSE   TILT TABLE STUDY  08-21-2011    DR KLEIN   BP WERE CONSISTENT 110-117 THROUGH OUT STUDY/ BASELINE HR 68-70   TILT TABLE STUDY N/A 08/21/2011   Procedure: TILT TABLE STUDY;  Surgeon: Deboraha Sprang, MD;  Location: [facility];  Service: Cardiovascular;  Laterality: N/A;   TONSILLECTOMY  age 54   Past Medical History:  Diagnosis Date   Abnormal heart rate    Abnormal uterine bleeding (AUB)    Allergic rhinitis    Anemia    Anxiety    Asthma    B12 deficiency    Bruises easily    Cat-scratch disease    h/o   Chest pain    Chronic back pain    oa   Chronic fatigue syndrome    CMV  (cytomegalovirus) (HCC)    h/o   Constipation    Degenerative disc disease, cervical    and lumbar   Depression    Fibrocystic breast disease    Fibroids    Fibromyalgia    GERD (gastroesophageal reflux disease)  watches diet   H/O hiatal hernia    H/O myomectomy    Hashimoto thyroiditis    History of babesiosis    PARASITITIS RESOLVED   History of Bell's palsy    2000--  resolved   History of concussion    age 32--  no residual   History of hyperparathyroidism    PRIMARY--  S/P PARATHYROIDECTOMY 01-28-2011   History of palpitations    stress induced   History of stomach ulcers    History of uterine fibroid    Hypoglycemia    Hypothyroidism    Joint pain    Kidney disease    3A   Lyme disease    CHRONIC --  FOR 22 YRS (SINCE APPROX 1996)  AND HX CAT SCRATCH DISEASE WHICH HAS RESOLVED   Musculoskeletal chest pain    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   Osteoarthritis    all joints secondary to lyme disease/  back is worse   Palpitations    SOB (shortness of breath)    Spinal stenosis    Swallowing difficulty    Vitamin B12 deficiency    Vitamin D deficiency    Wears contact lenses    BP (!) 147/91   Pulse (!) 107   Ht '5\' 4"'$  (1.626 m)   Wt 177 lb 9.6 oz (80.6 kg)   LMP 01/22/2011   SpO2 98%   BMI 30.48 kg/m   Opioid Risk Score:   Fall Risk Score:  `1  Depression screen [facility] 2/9     10/15/2021   10:23 AM 06/25/2021   10:59 AM 05/10/2021    3:17 PM 01/01/2021    9:54 AM 07/31/2020    8:26 AM 04/17/2020    9:47 AM 01/12/2020    9:38 AM  Depression screen PHQ 2/9  Decreased Interest 0 '1 1 1 '$ 0  1  Down, Depressed, Hopeless '1 1 1 1 '$ 0 0 1  PHQ - 2 Score '1 2 2 2 '$ 0 0 2  Altered sleeping       0  Tired, decreased energy       3  Change in appetite       2  Feeling bad or failure about yourself        2  Trouble concentrating       1  Moving slowly or fidgety/restless       0  Suicidal thoughts       0  PHQ-9 Score       10     Review of Systems  Constitutional:  Negative.   HENT: Negative.    Eyes: Negative.   Respiratory: Negative.    Cardiovascular: Negative.   Gastrointestinal: Negative.   Endocrine: Negative.   Genitourinary: Negative.   Musculoskeletal:  Positive for back pain.  Skin: Negative.   Allergic/Immunologic: Negative.   Neurological: Negative.   Hematological: Negative.   Psychiatric/Behavioral:  Positive for dysphoric mood.       Objective:   Physical Exam  Awake, alert, appropriate, trigger points in neck, upper/mid back B/L- esp rhomboids, NAD But cuddled up in blanket due ot being cold.       Assessment & Plan:   Pt is a 61 yr old female  with hx of asthma, DDD, DJD,  Cat scratch disease, Hashimoto's thyroiditis,Lyme's disease, EBV, chronic low back pain , fibromyalgia, and generalized chronic pain here for f/u on chronic pain issues.   See if she can get in somewhere for Glutothione shots- have been  real helpful for her in past. Looked up near [name], and had multiple places that do. Mainly spa locations.  2. Patient here for trigger point injections for  Consent done and on chart.  Cleaned areas with alcohol and injected using a 27 gauge 1.5 inch needle  Injected  3cc no waste Using 1% Lidocaine with no EPI  Upper traps B/L  Levators- B/L  Posterior scalenes Middle scalenes- B/L  Splenius Capitus Pectoralis Major Rhomboids B/L x2 Infraspinatus Teres Major/minor Thoracic paraspinals Lumbar paraspinals Other injections-    Patient's level of pain prior was 8/10 Current level of pain after injections is 6/10  There was no bleeding or complications.  Patient was advised to drink a lot of water on day after injections to flush system Will have increased soreness for 12-48 hours after injections.  Can use Lidocaine patches the day AFTER injections Can use theracane on day of injections in places didn't inject Can use heating pad 4-6 hours AFTER injections   3. Doesn't need refills on Low dose  naltrexone, Flexeril Tramadol and Trazodone- but will continue these meds.    4. F/U in 3 months for TrP injections and f/u on chronic pain.    I spent a total of  27 minutes on total care today- >50% coordination of care- due to 10 minutes for injections and 17 minutes discussing options for pain.

## 2021-11-28 ENCOUNTER — Telehealth: Payer: Self-pay | Admitting: *Deleted

## 2021-11-28 MED ORDER — TRAMADOL HCL 50 MG PO TABS: 50.0000 mg | ORAL_TABLET | Freq: Four times a day (QID) | ORAL | 5 refills | Status: DC | PRN

## 2021-11-28 NOTE — Telephone Encounter (Signed)
Notified of refill. 

## 2021-11-28 NOTE — Telephone Encounter (Signed)
[name] called for a refill on her tramadol.  She is taking her last two tablets tonight.

## 2021-12-03 ENCOUNTER — Encounter: Payer: Self-pay | Admitting: Physical Medicine and Rehabilitation

## 2021-12-05 ENCOUNTER — Other Ambulatory Visit: Payer: Self-pay | Admitting: Obstetrics and Gynecology

## 2021-12-05 DIAGNOSIS — Z1231 Encounter for screening mammogram for malignant neoplasm of breast: Secondary | ICD-10-CM

## 2021-12-12 NOTE — Telephone Encounter (Signed)
I put on your desk, signed for her- thanks- ML

## 2022-01-09 ENCOUNTER — Ambulatory Visit
Admission: RE | Admit: 2022-01-09 | Discharge: 2022-01-09 | Disposition: A | Discharge status: Home / Self Care | Payer: 59 | Source: Ambulatory Visit | Attending: Obstetrics and Gynecology | Admitting: Obstetrics and Gynecology

## 2022-01-09 DIAGNOSIS — Z1231 Encounter for screening mammogram for malignant neoplasm of breast: Secondary | ICD-10-CM

## 2022-01-23 ENCOUNTER — Encounter: Payer: 59 | Admitting: Physical Medicine and Rehabilitation

## 2022-01-24 ENCOUNTER — Other Ambulatory Visit: Payer: Self-pay | Admitting: Physical Medicine and Rehabilitation

## 2022-01-29 DIAGNOSIS — Z6829 Body mass index (BMI) 29.0-29.9, adult: Secondary | ICD-10-CM | POA: Diagnosis not present

## 2022-01-29 DIAGNOSIS — Z01419 Encounter for gynecological examination (general) (routine) without abnormal findings: Secondary | ICD-10-CM | POA: Diagnosis not present

## 2022-02-07 ENCOUNTER — Encounter: Payer: Self-pay | Admitting: Registered Nurse

## 2022-02-07 ENCOUNTER — Encounter: Payer: 59 | Attending: Physical Medicine and Rehabilitation | Admitting: Registered Nurse

## 2022-02-07 VITALS — BP 110/78 | HR 93 | Ht 64.0 in | Wt 173.0 lb

## 2022-02-07 DIAGNOSIS — M7918 Myalgia, other site: Secondary | ICD-10-CM

## 2022-02-07 DIAGNOSIS — G8929 Other chronic pain: Secondary | ICD-10-CM | POA: Diagnosis not present

## 2022-02-07 DIAGNOSIS — G894 Chronic pain syndrome: Secondary | ICD-10-CM | POA: Diagnosis not present

## 2022-02-07 DIAGNOSIS — Z8619 Personal history of other infectious and parasitic diseases: Secondary | ICD-10-CM | POA: Diagnosis not present

## 2022-02-07 DIAGNOSIS — Z79891 Long term (current) use of opiate analgesic: Secondary | ICD-10-CM

## 2022-02-07 DIAGNOSIS — M545 Low back pain, unspecified: Secondary | ICD-10-CM

## 2022-02-07 DIAGNOSIS — M797 Fibromyalgia: Secondary | ICD-10-CM | POA: Diagnosis not present

## 2022-02-07 DIAGNOSIS — Z5181 Encounter for therapeutic drug level monitoring: Secondary | ICD-10-CM | POA: Diagnosis not present

## 2022-02-07 NOTE — Progress Notes (Signed)
Subjective:    Patient ID: [name], female    DOB: [birth year], 61 y.o.   MRN: [id number]  HPI: [name] is a 61 y.o. female who returns for follow up appointment for chronic pain and medication refill. She states her pain is located in her lower back and bilateral hips. He rates his pain 3. Her current exercise regime is walking .  [name] Morphine equivalent is 20.00 MME.   Last UDS was Performed on 06/25/2021, it was consistent.   Pain Inventory Average Pain 3 Pain Right Now 3 My pain is constant and dull  In the last 24 hours, has pain interfered with the following? General activity 2 Relation with others 4 Enjoyment of life 2 What TIME of day is your pain at its worst? daytime Sleep (in general) Poor  Pain is worse with: walking, sitting, and standing Pain improves with: rest, medication, and injections Relief from Meds: 9  Family History  Problem Relation Age of Onset   Hypothyroidism Mother    Mitral valve prolapse Father    Hypertension Father    Dementia Father    Lupus Sister    Rheum arthritis Sister    Osteoarthritis Sister    Breast cancer Maternal Aunt        5s   Cancer Maternal Aunt        breast   Breast cancer Cousin        maternal 61s   Breast cancer Cousin        paternal 38s   Colon polyps Brother    Aneurysm Brother    Arthritis Brother    Social History   Socioeconomic History   Marital status: Married    Spouse name: Not on file   Number of children: Not on file   Years of education: 2 masters   Highest education level: Master's degree (e.g., MA, MS, MEng, MEd, MSW, MBA)  Occupational History   Not on file  Tobacco Use   Smoking status: Never   Smokeless tobacco: Never  Vaping Use   Vaping Use: Never used  Substance and Sexual Activity   Alcohol use: Not Currently    Comment: occasional wine    Drug use: No   Sexual activity: Not on file  Other Topics Concern   Not on file  Social History  Narrative   Lives at home with her husband and her adopted baby    Right handed   Caffeine: 2 cups daily    Social Determinants of Health   Financial Resource Strain: Not on file  Food Insecurity: Not on file  Transportation Needs: Not on file  Physical Activity: Not on file  Stress: Not on file  Social Connections: Not on file   Past Surgical History:  Procedure Laterality Date   BREAST BIOPSY Right 12/17/2018   benign   COLONOSCOPY  12/2017   DILATATION & CURRETTAGE/HYSTEROSCOPY WITH RESECTOCOPE N/A 12/02/2012   Procedure: DILATATION & CURETTAGE/HYSTEROSCOPY WITH RESECTOCOPE;  Surgeon: Darlyn Chamber, MD;  Location: [place];  Service: Gynecology;  Laterality: N/A;   GANGLION CYST EXCISION  2007   HAND   HERNIA REPAIR  2010   Exploratory   HYSTEROSCOPIC MYOMECTOMY AND ABDOMINAL MYOMECTOMY  2006   PARATHYROIDECTOMY  01/28/2011   Procedure: PARATHYROIDECTOMY;  Surgeon: Earnstine Regal, MD;  Location: [facility];  Service: General;  Laterality: N/A;    STRESS ECHO REPORT  09-01-2009  DR Martinique   NORMAL LVF/  EF  60%/  NORMAL HYPERDYNAMIC RESPONSE   TILT TABLE STUDY  08-21-2011    DR KLEIN   BP WERE CONSISTENT 110-117 THROUGH OUT STUDY/ BASELINE HR 68-70   TILT TABLE STUDY N/A 08/21/2011   Procedure: TILT TABLE STUDY;  Surgeon: Deboraha Sprang, MD;  [facility] CATH LAB;  Service: Cardiovascular;  Laterality: N/A;   TONSILLECTOMY  age 20   Past Surgical History:  Procedure Laterality Date   BREAST BIOPSY Right 12/17/2018   benign   COLONOSCOPY  12/2017   DILATATION & CURRETTAGE/HYSTEROSCOPY WITH RESECTOCOPE N/A 12/02/2012   Procedure: DILATATION & CURETTAGE/HYSTEROSCOPY WITH RESECTOCOPE;  Surgeon: Darlyn Chamber, MD;  Location: [place];  Service: Gynecology;  Laterality: N/A;   GANGLION CYST EXCISION  2007   HAND   HERNIA REPAIR  2010   Exploratory   HYSTEROSCOPIC MYOMECTOMY AND ABDOMINAL MYOMECTOMY  2006   PARATHYROIDECTOMY  01/28/2011    Procedure: PARATHYROIDECTOMY;  Surgeon: Earnstine Regal, MD;  Location: [facility];  Service: General;  Laterality: N/A;    STRESS ECHO REPORT  09-01-2009  DR [place]   NORMAL LVF/  EF 60%/  NORMAL HYPERDYNAMIC RESPONSE   TILT TABLE STUDY  08-21-2011    DR KLEIN   BP WERE CONSISTENT 110-117 THROUGH OUT STUDY/ BASELINE HR 68-70   TILT TABLE STUDY N/A 08/21/2011   Procedure: TILT TABLE STUDY;  Surgeon: Deboraha Sprang, MD;  Location: [facility] C[facility];  Service: Cardiovascular;  Laterality: N/A;   TONSILLECTOMY  age 67   Past Medical History:  Diagnosis Date   Abnormal heart rate    Abnormal uterine bleeding (AUB)    Allergic rhinitis    Anemia    Anxiety    Asthma    B12 deficiency    Bruises easily    Cat-scratch disease    h/o   Chest pain    Chronic back pain    oa   Chronic fatigue syndrome    CMV (cytomegalovirus) (HCC)    h/o   Constipation    Degenerative disc disease, cervical    and lumbar   Depression    Fibrocystic breast disease    Fibroids    Fibromyalgia    GERD (gastroesophageal reflux disease)    watches diet   H/O hiatal hernia    H/O myomectomy    Hashimoto thyroiditis    History of babesiosis    PARASITITIS RESOLVED   History of Bell's palsy    2000--  resolved   History of concussion    age 40--  no residual   History of hyperparathyroidism    PRIMARY--  S/P PARATHYROIDECTOMY 01-28-2011   History of palpitations    stress induced   History of stomach ulcers    History of uterine fibroid    Hypoglycemia    Hypothyroidism    Joint pain    Kidney disease    3A   Lyme disease    CHRONIC --  FOR 22 YRS (SINCE APPROX 1996)  AND HX CAT SCRATCH DISEASE WHICH HAS RESOLVED   Musculoskeletal chest pain    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   Osteoarthritis    all joints secondary to lyme disease/  back is worse   Palpitations    SOB (shortness of breath)    Spinal stenosis    Swallowing difficulty    Vitamin B12 deficiency    Vitamin D deficiency    Wears contact  lenses    BP 110/78   Pulse 93  Ht '5\' 4"'$  (1.626 m)   Wt 173 lb (78.5 kg)   LMP 01/22/2011   SpO2 98%   BMI 29.70 kg/m   Opioid Risk Score:   Fall Risk Score:  `1  Depression screen [facility] 2/9     10/15/2021   10:23 AM 06/25/2021   10:59 AM 05/10/2021    3:17 PM 01/01/2021    9:54 AM 07/31/2020    8:26 AM 04/17/2020    9:47 AM 01/12/2020    9:38 AM  Depression screen PHQ 2/9  Decreased Interest 0 '1 1 1 '$ 0  1  Down, Depressed, Hopeless '1 1 1 1 '$ 0 0 1  PHQ - 2 Score '1 2 2 2 '$ 0 0 2  Altered sleeping       0  Tired, decreased energy       3  Change in appetite       2  Feeling bad or failure about yourself        2  Trouble concentrating       1  Moving slowly or fidgety/restless       0  Suicidal thoughts       0  PHQ-9 Score       10     Review of Systems  Musculoskeletal:  Positive for back pain.  All other systems reviewed and are negative.     Objective:   Physical Exam Vitals and nursing note reviewed.  Constitutional:      Appearance: Normal appearance.  Cardiovascular:     Rate and Rhythm: Normal rate and regular rhythm.     Pulses: Normal pulses.     Heart sounds: Normal heart sounds.  Pulmonary:     Effort: Pulmonary effort is normal.     Breath sounds: Normal breath sounds.  Musculoskeletal:     Cervical back: Normal range of motion and neck supple.     Comments: Normal Muscle Bulk and Muscle Testing Reveals:  Upper Extremities: Full ROM and Muscle Strength 5/5 Bilateral AC Joint Tenderness Lumbar Paraspinal Tenderness: L-4-L-5 Lower Extremities: Full ROM and Muscle Strength 5/5 Left Lower Extremity Flexion Produces Pain into her Lumbar Arises from Table with Ease Narrow Based  Gait     Skin:    General: Skin is warm and dry.  Neurological:     Mental Status: She is alert and oriented to person, place, and time.  Psychiatric:        Mood and Affect: Mood normal.        Behavior: Behavior normal.         Assessment & Plan:  History of Lyme  Disease: She states she received SOT Infusion. Rheumatology Following. Continue to Monitor.  Fibromyalgia: Continue Gabapentin. Continue to Monitor.  Chronic Low Back Pain without Sciatica: Continue HEP as Tolerated. Continue to Monitor.  Myofascial Pain Syndrome: Continue current medication regimen. Continue to Monitor.  Chronic Pain Syndrome: Refilled: Tramadol 50 mg one tablet every 6 hours as needed for pain #120. We will continue the opioid monitoring program, this consists of regular clinic visits, examinations, urine drug screen, pill counts as well as use of [place] Controlled Substance Reporting system. A 12 month History has been reviewed on the [place] Controlled Substance Reporting System on 02/07/2022. F/U with Dr Dagoberto Ligas

## 2022-03-12 DIAGNOSIS — R21 Rash and other nonspecific skin eruption: Secondary | ICD-10-CM | POA: Diagnosis not present

## 2022-03-18 DIAGNOSIS — J3089 Other allergic rhinitis: Secondary | ICD-10-CM | POA: Diagnosis not present

## 2022-03-18 DIAGNOSIS — T781XXA Other adverse food reactions, not elsewhere classified, initial encounter: Secondary | ICD-10-CM | POA: Diagnosis not present

## 2022-03-18 DIAGNOSIS — J301 Allergic rhinitis due to pollen: Secondary | ICD-10-CM | POA: Diagnosis not present

## 2022-03-18 DIAGNOSIS — R052 Subacute cough: Secondary | ICD-10-CM | POA: Diagnosis not present

## 2022-04-11 DIAGNOSIS — M797 Fibromyalgia: Secondary | ICD-10-CM | POA: Diagnosis not present

## 2022-04-11 DIAGNOSIS — Z6829 Body mass index (BMI) 29.0-29.9, adult: Secondary | ICD-10-CM | POA: Diagnosis not present

## 2022-04-11 DIAGNOSIS — E663 Overweight: Secondary | ICD-10-CM | POA: Diagnosis not present

## 2022-04-11 DIAGNOSIS — Z8639 Personal history of other endocrine, nutritional and metabolic disease: Secondary | ICD-10-CM | POA: Diagnosis not present

## 2022-04-29 ENCOUNTER — Telehealth: Payer: Self-pay

## 2022-04-29 MED ORDER — TRAMADOL HCL 50 MG PO TABS: 50.0000 mg | ORAL_TABLET | Freq: Four times a day (QID) | ORAL | 5 refills | Status: DC | PRN

## 2022-04-29 NOTE — Telephone Encounter (Signed)
03/19/2022 11/28/2021 1  Tramadol Hcl 50 Mg Tablet 120.00 30 Me Lov [id number] Nor ([id number]) 3/5 40.00 MME Comm Ins [place]

## 2022-05-01 ENCOUNTER — Telehealth: Payer: Self-pay | Admitting: Physical Medicine and Rehabilitation

## 2022-05-01 ENCOUNTER — Encounter: Payer: Medicare Other | Admitting: Physical Medicine and Rehabilitation

## 2022-05-01 NOTE — Telephone Encounter (Signed)
I spoke with [name] and reviewed notes with Naltrexone.

## 2022-05-01 NOTE — Telephone Encounter (Signed)
She is needing dates of when Dr. Dagoberto Ligas sent the low dose Naltexone medicine to [place] for her taxes.

## 2022-05-31 ENCOUNTER — Ambulatory Visit: Payer: 59 | Admitting: Physical Medicine and Rehabilitation

## 2022-06-13 ENCOUNTER — Other Ambulatory Visit (INDEPENDENT_AMBULATORY_CARE_PROVIDER_SITE_OTHER): Payer: Self-pay | Admitting: Bariatrics

## 2022-06-26 ENCOUNTER — Encounter: Payer: 59 | Admitting: Physical Medicine and Rehabilitation

## 2022-06-28 ENCOUNTER — Encounter: Payer: 59 | Attending: Physical Medicine and Rehabilitation | Admitting: Physical Medicine and Rehabilitation

## 2022-06-28 ENCOUNTER — Encounter: Payer: Self-pay | Admitting: Physical Medicine and Rehabilitation

## 2022-06-28 VITALS — BP 114/81 | HR 100 | Ht 64.0 in | Wt 173.0 lb

## 2022-06-28 DIAGNOSIS — M7918 Myalgia, other site: Secondary | ICD-10-CM | POA: Diagnosis present

## 2022-06-28 DIAGNOSIS — M5136 Other intervertebral disc degeneration, lumbar region: Secondary | ICD-10-CM | POA: Diagnosis not present

## 2022-06-28 DIAGNOSIS — M797 Fibromyalgia: Secondary | ICD-10-CM

## 2022-06-28 MED ORDER — LIDOCAINE HCL 1 % IJ SOLN
3.0000 mL | Freq: Once | INTRAMUSCULAR | Status: AC
  Administered 2022-06-28: 3 mL

## 2022-06-28 NOTE — Addendum Note (Signed)
Addended by: Silas Sacramento T on: 06/28/2022 03:52 PM   Modules accepted: Orders

## 2022-06-28 NOTE — Patient Instructions (Signed)
Plan: Stay off Low dose Naltrexone when taking tramadol.   2.  Is on Mounjaro shot- per pt concerned about maintaining weight loss- suggested Noom.    3. Patient here for trigger point injections   Consent done and on chart.  Cleaned areas with alcohol and injected using a 27 gauge 1.5 inch needle  Injected 3cc Using 1% Lidocaine with no EPI  Upper traps B/L  Levators Posterior scalenes Middle scalenes Splenius Capitus Pectoralis Major Rhomboids- B/L x2 Infraspinatus Teres Major/minor B/L  Thoracic paraspinals B/L x3 Lumbar paraspinals B/L  Other injections-    Patient's level of pain prior was Current level of pain after injections is  There was no bleeding or complications.  Patient was advised to drink a lot of water on day after injections to flush system Will have increased soreness for 12-48 hours after injections.  Can use Lidocaine patches the day AFTER injections Can use theracane on day of injections in places didn't inject Can use heating pad 4-6 hours AFTER injections  4. Has Tramadol Rx- last filled early April- has 41 pills left today out of 120- has refills on Tramadol Rx.   5. Needs new doctor in [place]- moving soon- [address] area- needs to call and try to get doctor soon.   6. F/U prn- moving to [place].

## 2022-06-28 NOTE — Progress Notes (Signed)
Pt is a 62 yr old female  with hx of asthma, DDD, DJD,  Cat scratch disease, Hashimoto's thyroiditis,Lyme's disease, EBV, chronic low back pain , fibromyalgia, and generalized chronic pain here for f/u on chronic pain issues.     Moving to [place] 08/08/22- if gets house cleaned by then.   Wants some injections.   Last SOT treatment- has taken away most of her pain- so good.  Stress of move is setting her off.   Needs Tramadol to sleep-  is taking tramadol- 4 pills/day.   Doctor is trying all these supplements to see if can replace tramadol usage.   One night, needed 3 pills/tramadol and was hurting so bad.      Took an LDN- and so totally went off that.    Cannot take Low dose Naltrexone while on Tramadol.   Kidney function is good per pt- taking Monjaro shot.  Is now insulin resistant.     Plan: Stay off Low dose Naltrexone when taking tramadol.   2.  Is on Mounjaro shot- per pt concerned about maintaining weight loss- suggested Noom.    3. Patient here for trigger point injections   Consent done and on chart.  Cleaned areas with alcohol and injected using a 27 gauge 1.5 inch needle  Injected 3cc Using 1% Lidocaine with no EPI  Upper traps B/L  Levators Posterior scalenes Middle scalenes Splenius Capitus Pectoralis Major Rhomboids- B/L x2 Infraspinatus Teres Major/minor B/L  Thoracic paraspinals B/L x3 Lumbar paraspinals B/L  Other injections-    Patient's level of pain prior was Current level of pain after injections is  There was no bleeding or complications.  Patient was advised to drink a lot of water on day after injections to flush system Will have increased soreness for 12-48 hours after injections.  Can use Lidocaine patches the day AFTER injections Can use theracane on day of injections in places didn't inject Can use heating pad 4-6 hours AFTER injections  4. Has Tramadol Rx- last filled early April- has 41 pills left today out of 120-  has refills on Tramadol Rx. Shouldn't need a new Rx-   5. [place] needs to call and try to get doctor soon.   6. F/U prn- moving to [place].

## 2023-04-08 ENCOUNTER — Encounter: Payer: Self-pay | Admitting: Physical Medicine and Rehabilitation
